# Patient Record
Sex: Male | Born: 1935
Health system: Southern US, Community
[De-identification: ages and names within clinical notes are randomized; demographics above are authoritative.]

## PROBLEM LIST (undated history)

## (undated) DIAGNOSIS — Z9889 Other specified postprocedural states: Secondary | ICD-10-CM

## (undated) DIAGNOSIS — C801 Malignant (primary) neoplasm, unspecified: Secondary | ICD-10-CM

## (undated) DIAGNOSIS — R112 Nausea with vomiting, unspecified: Secondary | ICD-10-CM

## (undated) DIAGNOSIS — I509 Heart failure, unspecified: Secondary | ICD-10-CM

## (undated) DIAGNOSIS — I219 Acute myocardial infarction, unspecified: Secondary | ICD-10-CM

## (undated) DIAGNOSIS — D509 Iron deficiency anemia, unspecified: Secondary | ICD-10-CM

## (undated) DIAGNOSIS — I480 Paroxysmal atrial fibrillation: Secondary | ICD-10-CM

## (undated) DIAGNOSIS — K219 Gastro-esophageal reflux disease without esophagitis: Secondary | ICD-10-CM

## (undated) DIAGNOSIS — K635 Polyp of colon: Secondary | ICD-10-CM

## (undated) DIAGNOSIS — M109 Gout, unspecified: Secondary | ICD-10-CM

## (undated) DIAGNOSIS — Z974 Presence of external hearing-aid: Secondary | ICD-10-CM

## (undated) DIAGNOSIS — N200 Calculus of kidney: Secondary | ICD-10-CM

## (undated) DIAGNOSIS — I2510 Atherosclerotic heart disease of native coronary artery without angina pectoris: Secondary | ICD-10-CM

## (undated) DIAGNOSIS — E785 Hyperlipidemia, unspecified: Secondary | ICD-10-CM

## (undated) DIAGNOSIS — I10 Essential (primary) hypertension: Secondary | ICD-10-CM

## (undated) DIAGNOSIS — D649 Anemia, unspecified: Secondary | ICD-10-CM

## (undated) DIAGNOSIS — D696 Thrombocytopenia, unspecified: Secondary | ICD-10-CM

## (undated) HISTORY — PX: BLADDER SURGERY: SHX569

## (undated) HISTORY — DX: Atherosclerotic heart disease of native coronary artery without angina pectoris: I25.10

## (undated) HISTORY — PX: TONSILLECTOMY: SUR1361

## (undated) HISTORY — DX: Gastro-esophageal reflux disease without esophagitis: K21.9

## (undated) HISTORY — DX: Hyperlipidemia, unspecified: E78.5

## (undated) HISTORY — PX: LASER ABLATION: SHX1947

## (undated) HISTORY — DX: Acute myocardial infarction, unspecified: I21.9

## (undated) HISTORY — DX: Iron deficiency anemia, unspecified: D50.9

## (undated) HISTORY — DX: Polyp of colon: K63.5

## (undated) HISTORY — DX: Gout, unspecified: M10.9

## (undated) HISTORY — DX: Thrombocytopenia, unspecified: D69.6

## (undated) HISTORY — PX: CARDIAC CATHETERIZATION: SHX172

## (undated) HISTORY — PX: INGUINAL HERNIA REPAIR: SUR1180

## (undated) HISTORY — DX: Calculus of kidney: N20.0

## (undated) HISTORY — DX: Anemia, unspecified: D64.9

---

## 2002-11-27 DIAGNOSIS — I219 Acute myocardial infarction, unspecified: Secondary | ICD-10-CM

## 2002-11-27 HISTORY — DX: Acute myocardial infarction, unspecified: I21.9

## 2002-11-27 HISTORY — PX: CORONARY ARTERY BYPASS GRAFT: SHX141

## 2003-08-30 ENCOUNTER — Inpatient Hospital Stay (HOSPITAL_COMMUNITY)
Admission: EM | Admit: 2003-08-30 | Discharge: 2003-09-07 | Payer: Self-pay | Admitting: Thoracic Surgery (Cardiothoracic Vascular Surgery)

## 2003-08-31 ENCOUNTER — Encounter: Payer: Self-pay | Admitting: Thoracic Surgery (Cardiothoracic Vascular Surgery)

## 2003-09-01 ENCOUNTER — Encounter: Payer: Self-pay | Admitting: Thoracic Surgery (Cardiothoracic Vascular Surgery)

## 2003-09-02 ENCOUNTER — Encounter: Payer: Self-pay | Admitting: Thoracic Surgery (Cardiothoracic Vascular Surgery)

## 2003-09-06 ENCOUNTER — Encounter: Payer: Self-pay | Admitting: Cardiothoracic Surgery

## 2003-10-01 ENCOUNTER — Encounter
Admission: RE | Admit: 2003-10-01 | Discharge: 2003-10-01 | Payer: Self-pay | Admitting: Thoracic Surgery (Cardiothoracic Vascular Surgery)

## 2007-06-14 ENCOUNTER — Ambulatory Visit: Payer: Self-pay | Admitting: Unknown Physician Specialty

## 2009-04-12 ENCOUNTER — Ambulatory Visit: Payer: Self-pay | Admitting: Urology

## 2010-03-27 ENCOUNTER — Ambulatory Visit: Payer: Self-pay | Admitting: Internal Medicine

## 2010-04-19 ENCOUNTER — Ambulatory Visit: Payer: Self-pay | Admitting: Internal Medicine

## 2010-04-27 ENCOUNTER — Ambulatory Visit: Payer: Self-pay | Admitting: Internal Medicine

## 2010-05-05 ENCOUNTER — Ambulatory Visit: Payer: Self-pay | Admitting: Unknown Physician Specialty

## 2010-05-27 ENCOUNTER — Ambulatory Visit: Payer: Self-pay | Admitting: Internal Medicine

## 2010-07-11 ENCOUNTER — Ambulatory Visit: Payer: Self-pay | Admitting: Internal Medicine

## 2010-07-28 ENCOUNTER — Ambulatory Visit: Payer: Self-pay | Admitting: Internal Medicine

## 2010-09-12 ENCOUNTER — Ambulatory Visit: Payer: Self-pay | Admitting: Internal Medicine

## 2010-09-27 ENCOUNTER — Ambulatory Visit: Payer: Self-pay | Admitting: Internal Medicine

## 2010-11-14 ENCOUNTER — Ambulatory Visit: Payer: Self-pay | Admitting: Internal Medicine

## 2010-11-27 ENCOUNTER — Ambulatory Visit: Payer: Self-pay | Admitting: Internal Medicine

## 2010-12-28 ENCOUNTER — Ambulatory Visit: Payer: Self-pay | Admitting: Internal Medicine

## 2011-12-25 ENCOUNTER — Ambulatory Visit: Payer: Self-pay | Admitting: Internal Medicine

## 2011-12-25 LAB — CBC CANCER CENTER
Basophil #: 0 x10 3/mm (ref 0.0–0.1)
Basophil %: 0.4 %
Eosinophil #: 0.4 x10 3/mm (ref 0.0–0.7)
Eosinophil %: 3.3 %
HCT: 50.5 % (ref 40.0–52.0)
HGB: 17.2 g/dL (ref 13.0–18.0)
Lymphocyte #: 1.5 x10 3/mm (ref 1.0–3.6)
Lymphocyte %: 12.6 %
MCH: 31.2 pg (ref 26.0–34.0)
MCHC: 33.9 g/dL (ref 32.0–36.0)
MCV: 92 fL (ref 80–100)
Monocyte #: 0.8 x10 3/mm — ABNORMAL HIGH (ref 0.0–0.7)
Monocyte %: 6.9 %
Neutrophil #: 9.5 x10 3/mm — ABNORMAL HIGH (ref 1.4–6.5)
Neutrophil %: 76.8 %
Platelet: 90 x10 3/mm — ABNORMAL LOW (ref 150–440)
RBC: 5.51 10*6/uL (ref 4.40–5.90)
RDW: 13.6 % (ref 11.5–14.5)
WBC: 12.3 x10 3/mm — ABNORMAL HIGH (ref 3.8–10.6)

## 2011-12-25 LAB — IRON AND TIBC
Iron Bind.Cap.(Total): 307 ug/dL (ref 250–450)
Iron Saturation: 28 %
Iron: 85 ug/dL (ref 65–175)
Unbound Iron-Bind.Cap.: 222 ug/dL

## 2011-12-25 LAB — FERRITIN: Ferritin (ARMC): 98 ng/mL (ref 8–388)

## 2011-12-29 ENCOUNTER — Ambulatory Visit: Payer: Self-pay | Admitting: Internal Medicine

## 2012-12-17 ENCOUNTER — Ambulatory Visit: Payer: Self-pay | Admitting: Urology

## 2012-12-17 LAB — CBC WITH DIFFERENTIAL/PLATELET
Basophil %: 0.5 %
HCT: 41 % (ref 40.0–52.0)
HGB: 13.9 g/dL (ref 13.0–18.0)
Lymphocyte #: 1.2 10*3/uL (ref 1.0–3.6)
Lymphocyte %: 9.3 %
MCH: 30.6 pg (ref 26.0–34.0)
Monocyte #: 1.2 x10 3/mm — ABNORMAL HIGH (ref 0.2–1.0)
Monocyte %: 9.2 %
Neutrophil #: 10.3 10*3/uL — ABNORMAL HIGH (ref 1.4–6.5)
Neutrophil %: 78.8 %
RBC: 4.54 10*6/uL (ref 4.40–5.90)
RDW: 13.3 % (ref 11.5–14.5)

## 2012-12-17 LAB — BASIC METABOLIC PANEL
Anion Gap: 8 (ref 7–16)
BUN: 19 mg/dL — ABNORMAL HIGH (ref 7–18)
Calcium, Total: 8.4 mg/dL — ABNORMAL LOW (ref 8.5–10.1)
Chloride: 106 mmol/L (ref 98–107)
Co2: 26 mmol/L (ref 21–32)
Creatinine: 1.05 mg/dL (ref 0.60–1.30)
EGFR (African American): >60
EGFR (Non-African Amer.): >60
Glucose: 77 mg/dL (ref 65–99)
Osmolality: 280 (ref 275–301)
Potassium: 4.4 mmol/L (ref 3.5–5.1)
Sodium: 140 mmol/L (ref 136–145)

## 2012-12-23 ENCOUNTER — Ambulatory Visit: Payer: Self-pay | Admitting: Urology

## 2012-12-23 LAB — PLATELET COUNT: Platelet: 90 10*3/uL — ABNORMAL LOW (ref 150–440)

## 2012-12-28 ENCOUNTER — Ambulatory Visit: Payer: Self-pay | Admitting: Internal Medicine

## 2012-12-30 LAB — IRON AND TIBC
Iron: 87 ug/dL (ref 65–175)
Unbound Iron-Bind.Cap.: 200 ug/dL

## 2012-12-30 LAB — CBC CANCER CENTER
Basophil #: 0.1 x10 3/mm (ref 0.0–0.1)
Basophil %: 0.7 %
Eosinophil #: 0.4 x10 3/mm (ref 0.0–0.7)
HCT: 45.1 % (ref 40.0–52.0)
Lymphocyte #: 1.2 x10 3/mm (ref 1.0–3.6)
MCH: 30.7 pg (ref 26.0–34.0)
MCHC: 33.6 g/dL (ref 32.0–36.0)
MCV: 91 fL (ref 80–100)
Neutrophil %: 80.5 %
Platelet: 85 x10 3/mm — ABNORMAL LOW (ref 150–440)
RBC: 4.95 10*6/uL (ref 4.40–5.90)
RDW: 13.5 % (ref 11.5–14.5)

## 2012-12-30 LAB — FERRITIN: Ferritin (ARMC): 111 ng/mL (ref 8–388)

## 2013-01-25 ENCOUNTER — Ambulatory Visit: Payer: Self-pay | Admitting: Internal Medicine

## 2013-12-30 ENCOUNTER — Ambulatory Visit: Payer: Self-pay | Admitting: Internal Medicine

## 2013-12-30 LAB — CBC CANCER CENTER
BASOS ABS: 0.1 x10 3/mm (ref 0.0–0.1)
Basophil %: 0.8 %
Eosinophil #: 0.3 x10 3/mm (ref 0.0–0.7)
Eosinophil %: 3 %
HCT: 48.1 % (ref 40.0–52.0)
HGB: 15.8 g/dL (ref 13.0–18.0)
LYMPHS PCT: 11.2 %
Lymphocyte #: 1.2 x10 3/mm (ref 1.0–3.6)
MCH: 29.7 pg (ref 26.0–34.0)
MCHC: 32.9 g/dL (ref 32.0–36.0)
MCV: 90 fL (ref 80–100)
Monocyte #: 0.7 x10 3/mm (ref 0.2–1.0)
Monocyte %: 6.2 %
Neutrophil #: 8.6 x10 3/mm — ABNORMAL HIGH (ref 1.4–6.5)
Neutrophil %: 78.8 %
PLATELETS: 72 x10 3/mm — AB (ref 150–440)
RBC: 5.33 10*6/uL (ref 4.40–5.90)
RDW: 13.3 % (ref 11.5–14.5)
WBC: 10.9 x10 3/mm — ABNORMAL HIGH (ref 3.8–10.6)

## 2013-12-30 LAB — IRON AND TIBC
IRON BIND. CAP.(TOTAL): 311 ug/dL (ref 250–450)
IRON SATURATION: 31 %
Iron: 97 ug/dL (ref 65–175)
UNBOUND IRON-BIND. CAP.: 214 ug/dL

## 2013-12-30 LAB — FERRITIN: Ferritin (ARMC): 82 ng/mL (ref 8–388)

## 2014-01-25 ENCOUNTER — Ambulatory Visit: Payer: Self-pay | Admitting: Internal Medicine

## 2014-02-12 ENCOUNTER — Ambulatory Visit: Payer: Self-pay | Admitting: Unknown Physician Specialty

## 2014-02-12 LAB — PLATELET COUNT: Platelet: 70 10*3/uL — ABNORMAL LOW (ref 150–440)

## 2014-02-16 LAB — PATHOLOGY REPORT

## 2014-06-29 ENCOUNTER — Ambulatory Visit: Payer: Self-pay | Admitting: Internal Medicine

## 2014-06-29 LAB — IRON AND TIBC
Iron Bind.Cap.(Total): 290 ug/dL (ref 250–450)
Iron Saturation: 27 %
Iron: 78 ug/dL (ref 65–175)
UNBOUND IRON-BIND. CAP.: 212 ug/dL

## 2014-06-29 LAB — CBC CANCER CENTER
Basophil #: 0.1 x10 3/mm (ref 0.0–0.1)
Basophil %: 0.7 %
Eosinophil #: 0.4 x10 3/mm (ref 0.0–0.7)
Eosinophil %: 3.7 %
HCT: 47.2 % (ref 40.0–52.0)
HGB: 15.6 g/dL (ref 13.0–18.0)
LYMPHS ABS: 1.2 x10 3/mm (ref 1.0–3.6)
Lymphocyte %: 11.4 %
MCH: 30 pg (ref 26.0–34.0)
MCHC: 33.1 g/dL (ref 32.0–36.0)
MCV: 91 fL (ref 80–100)
MONO ABS: 0.7 x10 3/mm (ref 0.2–1.0)
Monocyte %: 6.5 %
Neutrophil #: 8.2 x10 3/mm — ABNORMAL HIGH (ref 1.4–6.5)
Neutrophil %: 77.7 %
Platelet: 74 x10 3/mm — ABNORMAL LOW (ref 150–440)
RBC: 5.21 10*6/uL (ref 4.40–5.90)
RDW: 13.9 % (ref 11.5–14.5)
WBC: 10.6 x10 3/mm (ref 3.8–10.6)

## 2014-06-29 LAB — FERRITIN: FERRITIN (ARMC): 80 ng/mL (ref 8–388)

## 2014-07-28 ENCOUNTER — Ambulatory Visit: Payer: Self-pay | Admitting: Internal Medicine

## 2014-09-29 DIAGNOSIS — I2510 Atherosclerotic heart disease of native coronary artery without angina pectoris: Secondary | ICD-10-CM | POA: Insufficient documentation

## 2014-12-30 ENCOUNTER — Ambulatory Visit: Payer: Self-pay | Admitting: Internal Medicine

## 2014-12-30 LAB — IRON AND TIBC
IRON BIND. CAP.(TOTAL): 275 ug/dL (ref 250–450)
IRON SATURATION: 35 %
IRON: 95 ug/dL (ref 65–175)
UNBOUND IRON-BIND. CAP.: 180 ug/dL

## 2014-12-30 LAB — CBC CANCER CENTER
Basophil #: 0.1 "x10 3/mm "
Basophil %: 0.6 %
Eosinophil #: 0.4 "x10 3/mm "
Eosinophil %: 3.5 %
HCT: 46.7 %
HGB: 15.3 g/dL
Lymphocyte %: 10.8 %
Lymphs Abs: 1.2 "x10 3/mm "
MCH: 29.5 pg
MCHC: 32.7 g/dL
MCV: 90 fL
Monocyte #: 1 "x10 3/mm "
Monocyte %: 8.4 %
Neutrophil #: 8.7 "x10 3/mm " — ABNORMAL HIGH
Neutrophil %: 76.7 %
Platelet: 69 "x10 3/mm " — ABNORMAL LOW
RBC: 5.19 "x10 6/mm "
RDW: 13.7 %
WBC: 11.3 "x10 3/mm " — ABNORMAL HIGH

## 2014-12-30 LAB — FERRITIN: Ferritin (ARMC): 80 ng/mL

## 2015-01-26 ENCOUNTER — Ambulatory Visit
Admit: 2015-01-26 | Disposition: A | Discharge status: Home / Self Care | Payer: Self-pay | Attending: Internal Medicine | Admitting: Internal Medicine

## 2015-03-19 NOTE — H&P (Signed)
PATIENT NAME:  Levi Figueroa, Levi Figueroa MR#:  [id number] DATE OF BIRTH:  February 24, 1935  DATE OF ADMISSION:  12/23/2012  CHIEF COMPLAINT: Difficulty voiding.   HISTORY OF PRESENT ILLNESS: Mr. Denne is a 80 year old white male with a greater than one-year history of progressively worsening urinary symptoms. At the present time, he complains of incomplete emptying, frequency, intermittency, weak stream, straining and nocturia, and a UA symptom score of 18 with a quality-of-life score of 4. He also has slight degree of dysuria. He has history of BPH and is status post laser vaporization back in 2004. He underwent Indigo laser treatment in 1999 and removal of a bladder stone in 2004. Evaluation in the office included a PSA of 2.0, uroflow with maximum flow rate of 6 mL/sec and a cystoscopy which indicated bladder neck contracture of approximately 23 French with lateral lobe prostatic hypertrophy. He comes in now for photovaporization of the prostate and bladder neck with the green light laser.   ALLERGIES: PENICILLIN.   CURRENT MEDICATIONS:  1. Omeprazole 20 mg a day. 2. Loratadine 10 mg a day. 3. Diltiazem ER 240 mg a day. 4. Metoprolol 25 mg 2 times a day. 5. Atorvastatin 50 mg a day. 6. Multivitamin one a day.  7. Fish oil 1000 units a day. 8. Aspirin 81 mg a day. 9. Extra-strength Tylenol p.r.n.  10. Aleve p.r.n.  11. Ibuprofen p.r.n.   PAST SURGICAL HISTORY:  1. Coronary artery bypass graft x 5. 2. Right inguinal herniorrhaphy x 2. 3. Laser ablation of the prostate x 2. 4. Colonoscopy with polyp removal.  5. Tonsillectomy.   SOCIAL HISTORY: The patient denied tobacco or alcohol abuse.   FAMILY HISTORY: The patient's father died of coronary artery disease. Father also had diabetes. Mother had breast cancer.   PAST AND CURRENT MEDICAL CONDITIONS:  1. Coronary artery disease.  2. Hyperlipidemia.  3. Gastroesophageal reflux disease. 4. Thrombocytopenia.  5. Hypertension.   REVIEW OF SYSTEMS:  The patient denies chest pain, shortness of breath, diabetes or stroke.   PHYSICAL EXAMINATION:   GENERAL: An elderly white male in no distress.   HEENT: Sclerae were clear. Pupils were equally round and reactive to light and accommodation.   NECK: Supple. No palpable cervical adenopathy.   PULMONARY: Lungs clear to auscultation.   CARDIOVASCULAR: Regular rhythm and rate without audible murmurs.   ABDOMEN: Soft and nontender abdomen.   GENITOURINARY: Uncircumcised. Testes smooth and nontender. Left testis atrophic, right testis normal.   RECTAL: Greater than 50 gram smooth, nontender prostate.   NEUROPSYCHIATRIC: Alert and oriented x 3, nonfocal.     IMPRESSION: Bladder outlet obstruction.   PLAN: Photovaporization of the prostate and bladder neck with green light laser. ____________________________ Otelia Limes. Yves Dill, MD [email or web address] D: 12/18/2012 10:54:24 ET T: 12/18/2012 11:11:29 ET JOB#: [id number]  cc: Otelia Limes. Yves Dill, MD, <Dictator> Royston Cowper MD ELECTRONICALLY SIGNED 12/23/2012 7:17

## 2015-03-19 NOTE — Op Note (Signed)
PATIENT NAME:  Levi Figueroa, Levi Figueroa MR#:  [id number] DATE OF BIRTH:  January 22, 1935  DATE OF PROCEDURE:  12/23/2012  PREOPERATIVE DIAGNOSES: 1. Bladder outlet obstruction.  2. Hematuria. 3. Benign prostatic hypertrophy.   POSTOPERATIVE DIAGNOSES: 1. Bladder outlet obstruction.  2. Hematuria. 3. Benign prostatic hypertrophy.   PROCEDURE PERFORMED: Photovaporization of the prostate with green light laser.   SURGEON: Maryan Puls, MD  ANESTHETIST: Julianne Handler, MD / Maryan Puls, MD  ANESTHESIA: General per Dr. [place] Service and local per Dr. Yves Dill.   INDICATIONS: See the dictated history and physical. After informed consent, the patient requests the above procedure.   OPERATIVE SUMMARY: After adequate general anesthesia had been obtained, the patient was placed into the dorsal lithotomy position and the perineum was prepped and draped in the usual fashion. The laser scope was coupled with the camera and then visually advanced into the bladder. The patient had trilobar BPH with visual obstruction. Bladder was moderately trabeculated. No bladder mucosal lesions were identified. Both ureteral orifices were identified and had clear efflux. At this point, the green light XPS laser fiber was introduced through the scope and initial power setting placed at 80 watts. Bladder neck tissue and median lobe was vaporized. Power was then increased to 120 watts and remaining obstructive tissue was vaporized back to the level of the verumontanum. At this point, the scope was removed and 10 mL of viscous Xylocaine was instilled within the urethra and the bladder. A 20 French silicone     catheter was placed. The catheter was irrigated until clear. A B and O suppository was placed. The procedure was then terminated and the patient was transferred to the recovery room in stable condition.  ____________________________ Otelia Limes. Yves Dill, MD [email or web address] D: 12/23/2012 08:54:24 ET T: 12/23/2012 09:03:14  ET JOB#: [id number]  cc: Otelia Limes. Yves Dill, MD, <Dictator> Royston Cowper MD ELECTRONICALLY SIGNED 12/23/2012 9:48

## 2015-06-30 ENCOUNTER — Inpatient Hospital Stay: Payer: PPO | Attending: Internal Medicine

## 2015-06-30 ENCOUNTER — Other Ambulatory Visit: Payer: Self-pay | Admitting: *Deleted

## 2015-06-30 DIAGNOSIS — D649 Anemia, unspecified: Secondary | ICD-10-CM

## 2015-06-30 DIAGNOSIS — D696 Thrombocytopenia, unspecified: Secondary | ICD-10-CM | POA: Insufficient documentation

## 2015-06-30 DIAGNOSIS — D72829 Elevated white blood cell count, unspecified: Secondary | ICD-10-CM | POA: Diagnosis not present

## 2015-06-30 LAB — VITAMIN B12: VITAMIN B 12: 598 pg/mL (ref 180–914)

## 2015-06-30 LAB — CBC WITH DIFFERENTIAL/PLATELET
Basophils Absolute: 0.1 10*3/uL (ref 0–0.1)
Basophils Relative: 1 %
EOS PCT: 3 %
Eosinophils Absolute: 0.3 10*3/uL (ref 0–0.7)
HCT: 47.1 % (ref 40.0–52.0)
HEMOGLOBIN: 15.6 g/dL (ref 13.0–18.0)
LYMPHS PCT: 10 %
Lymphs Abs: 1.3 10*3/uL (ref 1.0–3.6)
MCH: 29.5 pg (ref 26.0–34.0)
MCHC: 33.2 g/dL (ref 32.0–36.0)
MCV: 89.1 fL (ref 80.0–100.0)
Monocytes Absolute: 1 10*3/uL (ref 0.2–1.0)
Monocytes Relative: 8 %
NEUTROS ABS: 9.9 10*3/uL — AB (ref 1.4–6.5)
NEUTROS PCT: 78 %
Platelets: 78 10*3/uL — ABNORMAL LOW (ref 150–440)
RBC: 5.28 MIL/uL (ref 4.40–5.90)
RDW: 14.3 % (ref 11.5–14.5)
WBC: 12.5 10*3/uL — ABNORMAL HIGH (ref 3.8–10.6)

## 2015-06-30 LAB — FOLATE: Folate: 40 ng/mL (ref >5.9)

## 2015-06-30 LAB — IRON AND TIBC
Iron: 93 ug/dL (ref 45–182)
SATURATION RATIOS: 31 % (ref 17.9–39.5)
TIBC: 298 ug/dL (ref 250–450)
UIBC: 205 ug/dL

## 2015-06-30 LAB — PROTIME-INR
INR: 1.05
Prothrombin Time: 13.9 seconds (ref 11.4–15.0)

## 2015-06-30 LAB — APTT: aPTT: 30 seconds (ref 24–36)

## 2015-06-30 LAB — FERRITIN: Ferritin: 82 ng/mL (ref 24–336)

## 2015-12-29 ENCOUNTER — Other Ambulatory Visit: Payer: Self-pay | Admitting: *Deleted

## 2015-12-29 ENCOUNTER — Encounter: Payer: Self-pay | Admitting: *Deleted

## 2015-12-29 DIAGNOSIS — D696 Thrombocytopenia, unspecified: Secondary | ICD-10-CM

## 2015-12-29 DIAGNOSIS — D509 Iron deficiency anemia, unspecified: Secondary | ICD-10-CM

## 2015-12-31 ENCOUNTER — Encounter: Payer: Self-pay | Admitting: Internal Medicine

## 2015-12-31 ENCOUNTER — Inpatient Hospital Stay: Payer: PPO | Attending: Internal Medicine

## 2015-12-31 ENCOUNTER — Inpatient Hospital Stay (HOSPITAL_BASED_OUTPATIENT_CLINIC_OR_DEPARTMENT_OTHER): Payer: PPO | Admitting: Internal Medicine

## 2015-12-31 VITALS — BP 132/72 | HR 76 | Temp 97.6°F | Resp 18 | Ht 71.0 in | Wt 176.8 lb

## 2015-12-31 DIAGNOSIS — K219 Gastro-esophageal reflux disease without esophagitis: Secondary | ICD-10-CM

## 2015-12-31 DIAGNOSIS — Z7982 Long term (current) use of aspirin: Secondary | ICD-10-CM | POA: Insufficient documentation

## 2015-12-31 DIAGNOSIS — Z87442 Personal history of urinary calculi: Secondary | ICD-10-CM | POA: Diagnosis not present

## 2015-12-31 DIAGNOSIS — Z79899 Other long term (current) drug therapy: Secondary | ICD-10-CM | POA: Insufficient documentation

## 2015-12-31 DIAGNOSIS — Z951 Presence of aortocoronary bypass graft: Secondary | ICD-10-CM

## 2015-12-31 DIAGNOSIS — I252 Old myocardial infarction: Secondary | ICD-10-CM

## 2015-12-31 DIAGNOSIS — I2510 Atherosclerotic heart disease of native coronary artery without angina pectoris: Secondary | ICD-10-CM

## 2015-12-31 DIAGNOSIS — M109 Gout, unspecified: Secondary | ICD-10-CM | POA: Diagnosis not present

## 2015-12-31 DIAGNOSIS — Z86010 Personal history of colonic polyps: Secondary | ICD-10-CM

## 2015-12-31 DIAGNOSIS — E785 Hyperlipidemia, unspecified: Secondary | ICD-10-CM | POA: Diagnosis not present

## 2015-12-31 DIAGNOSIS — D693 Immune thrombocytopenic purpura: Secondary | ICD-10-CM

## 2015-12-31 DIAGNOSIS — D509 Iron deficiency anemia, unspecified: Secondary | ICD-10-CM | POA: Diagnosis not present

## 2015-12-31 DIAGNOSIS — D696 Thrombocytopenia, unspecified: Secondary | ICD-10-CM | POA: Insufficient documentation

## 2015-12-31 LAB — CBC WITH DIFFERENTIAL/PLATELET
Basophils Absolute: 0.1 10*3/uL (ref 0–0.1)
Basophils Relative: 1 %
Eosinophils Absolute: 0.3 10*3/uL (ref 0–0.7)
Eosinophils Relative: 2 %
HCT: 45 % (ref 40.0–52.0)
Hemoglobin: 15.3 g/dL (ref 13.0–18.0)
Lymphocytes Relative: 7 %
Lymphs Abs: 0.9 10*3/uL — ABNORMAL LOW (ref 1.0–3.6)
MCH: 30 pg (ref 26.0–34.0)
MCHC: 34.1 g/dL (ref 32.0–36.0)
MCV: 87.9 fL (ref 80.0–100.0)
Monocytes Absolute: 0.9 10*3/uL (ref 0.2–1.0)
Monocytes Relative: 7 %
Neutro Abs: 10.6 10*3/uL — ABNORMAL HIGH (ref 1.4–6.5)
Neutrophils Relative %: 83 %
Platelets: 67 10*3/uL — ABNORMAL LOW (ref 150–440)
RBC: 5.11 MIL/uL (ref 4.40–5.90)
RDW: 13.7 % (ref 11.5–14.5)
WBC: 12.8 10*3/uL — ABNORMAL HIGH (ref 3.8–10.6)

## 2015-12-31 LAB — IRON AND TIBC
Iron: 78 ug/dL (ref 45–182)
SATURATION RATIOS: 27 % (ref 17.9–39.5)
TIBC: 292 ug/dL (ref 250–450)
UIBC: 214 ug/dL

## 2015-12-31 LAB — FERRITIN: Ferritin: 91 ng/mL (ref 24–336)

## 2015-12-31 NOTE — Progress Notes (Signed)
[facility] OFFICE PROGRESS NOTE  Patient Care Team: [name], MD as PCP - General (Internal Medicine)   SUMMARY OF ONCOLOGIC HISTORY:  2012- CHRONIC THROMBOCYTOPENIA [70-90s] likely ITP [ BMBx; 2012- hypercellular 60%; megakaryocytes/no dyspoiesis; FISH- Neg; Dr.Pandit]; [place]- Abdo-Neg for spleen/liver; HIV/hepatitis-NEG  INTERVAL HISTORY:  This is my first interaction with the patient since I joined the practice September 2016. I reviewed the patient's prior charts/pertinent labs/imaging in detail; findings are summarized above.   A very pleasant 81 year old Caucasian male patient with above history of chronic thrombocytopenia currently on surveillance is here for follow-up.  Patient denies any blood in stools black stools. No nausea no vomiting. No weight loss. No skin rash. No night sweats.    REVIEW OF SYSTEMS:  A complete 10 point review of system is done which is negative except mentioned above/history of present illness.   PAST MEDICAL HISTORY :  Past Medical History  Diagnosis Date  . Thrombocytopenia ([place])   . Mild anemia   . IDA (iron deficiency anemia)   . Hyperlipidemia   . GERD (gastroesophageal reflux disease)   . Colon polyps   . CAD (coronary artery disease)   . MI (myocardial infarction) ([place]) 2004  . Kidney stones   . Gout     PAST SURGICAL HISTORY :   Past Surgical History  Procedure Laterality Date  . Inguinal hernia repair Right   . Coronary artery bypass graft    . Laser ablation      x 2 prostatic hypertrophy  . Bladder surgery    . Tonsillectomy      FAMILY HISTORY :   Family History  Problem Relation Age of Onset  . Breast cancer Mother   . Diabetes Father   . Heart disease Father   . Heart disease Brother   . Heart disease Brother     SOCIAL HISTORY:   Social History  Substance Use Topics  . Smoking status: Never Smoker   . Smokeless tobacco: Never Used  . Alcohol Use: No    ALLERGIES:  is allergic to  penicillins.  MEDICATIONS:  Current Outpatient Prescriptions  Medication Sig Dispense Refill  . acetaminophen (TYLENOL) 500 MG tablet Take 500 mg by mouth every 6 (six) hours as needed.    Marland Kitchen aspirin 81 MG tablet Take 81 mg by mouth daily.    Marland Kitchen atorvastatin (LIPITOR) 10 MG tablet Take 10 mg by mouth daily. 1/2 tablet    . colchicine 0.6 MG tablet Take 0.6 mg by mouth daily as needed.    . Cyanocobalamin (VITAMIN B 12 PO) Take 1 Dose by mouth daily.    Marland Kitchen diltiazem (DILACOR XR) 240 MG 24 hr capsule Take 240 mg by mouth.    . loratadine (CLARITIN) 10 MG tablet Take 10 mg by mouth daily as needed for allergies.    . metoprolol succinate (TOPROL-XL) 25 MG 24 hr tablet Take 25 mg by mouth 2 (two) times daily.    . Multiple Vitamins-Minerals (MULTIVITAMIN WITH MINERALS) tablet Take 1 tablet by mouth daily.    . multivitamin-lutein (OCUVITE-LUTEIN) CAPS capsule Take 2 capsules by mouth daily.    . Omega-3 Fatty Acids (FISH OIL) 1000 MG CAPS Take 1 capsule by mouth daily.    Marland Kitchen omeprazole (PRILOSEC) 20 MG capsule Take 20 mg by mouth daily.     No current facility-administered medications for this visit.    PHYSICAL EXAMINATION:   BP 132/72 mmHg  Pulse 76  Temp(Src) 97.6 F (  36.4 C) (Tympanic)  Ht 5\' 11"  (1.803 m)  Wt 176 lb 12.9 oz (80.2 kg)  BMI 24.67 kg/m2  Filed Weights   12/31/15 1159  Weight: 176 lb 12.9 oz (80.2 kg)    GENERAL: Well-nourished well-developed; Alert, no distress and comfortable.  Accompanied by his wife. EYES: no pallor or icterus OROPHARYNX: no thrush or ulceration;  NECK: supple, no masses felt LYMPH:  no palpable lymphadenopathy in the cervical, axillary or inguinal regions LUNGS: clear to auscultation and  No wheeze or crackles HEART/CVS: regular rate & rhythm and no murmurs; No lower extremity edema ABDOMEN:abdomen soft, non-tender and normal bowel sounds Musculoskeletal:no cyanosis of digits and no clubbing  PSYCH: alert & oriented x 3 with fluent  speech NEURO: no focal motor/sensory deficits SKIN:  no rashes or significant lesions  LABORATORY DATA:  I have reviewed the data as listed    Component Value Date/Time   NA 140 12/17/2012 1229   K 4.4 12/17/2012 1229   CL 106 12/17/2012 1229   CO2 26 12/17/2012 1229   GLUCOSE 77 12/17/2012 1229   BUN 19* 12/17/2012 1229   CREATININE 1.05 12/17/2012 1229   CALCIUM 8.4* 12/17/2012 1229   GFRNONAA >60 12/17/2012 1229   GFRAA >60 12/17/2012 1229    No results found for: SPEP, UPEP  Lab Results  Component Value Date   WBC 12.8* 12/31/2015   NEUTROABS 10.6* 12/31/2015   HGB 15.3 12/31/2015   HCT 45.0 12/31/2015   MCV 87.9 12/31/2015   PLT 67* 12/31/2015      Chemistry      Component Value Date/Time   NA 140 12/17/2012 1229   K 4.4 12/17/2012 1229   CL 106 12/17/2012 1229   CO2 26 12/17/2012 1229   BUN 19* 12/17/2012 1229   CREATININE 1.05 12/17/2012 1229      Component Value Date/Time   CALCIUM 8.4* 12/17/2012 1229       RADIOGRAPHIC STUDIES: I have personally reviewed the radiological images as listed and agreed with the findings in the report. No results found.   ASSESSMENT & PLAN:   # Chronic thrombocytopenia- likely ITP. Currently on surveillance. Today platelets are 67 otherwise hemoglobin is normal white count is slightly elevated 12. Patient is asymptomatic.   # for now I recommend continued surveillance; however spoke to the patient that I would recommen treatment if his platelets are less than 50/as the patient is on aspirin. Patient's platelet count today is 67. Also discussed that I might start him on low-dose of steroids if his platelets are less than 50.  # patient will have his labs done through Dr. Jens Som in 3 months [he will have the labs faxed over to us]; otherwise he'll see me back in 6 months with labs.     Cammie Sickle, MD 12/31/2015 12:05 PM

## 2015-12-31 NOTE — Progress Notes (Signed)
No bleeding from gums, nose or in stool or urine. No extra bruising noted. No concerns.

## 2016-01-28 DIAGNOSIS — R351 Nocturia: Secondary | ICD-10-CM | POA: Diagnosis not present

## 2016-01-28 DIAGNOSIS — N400 Enlarged prostate without lower urinary tract symptoms: Secondary | ICD-10-CM | POA: Diagnosis not present

## 2016-03-06 DIAGNOSIS — L570 Actinic keratosis: Secondary | ICD-10-CM | POA: Diagnosis not present

## 2016-03-06 DIAGNOSIS — X32XXXA Exposure to sunlight, initial encounter: Secondary | ICD-10-CM | POA: Diagnosis not present

## 2016-03-06 DIAGNOSIS — D485 Neoplasm of uncertain behavior of skin: Secondary | ICD-10-CM | POA: Diagnosis not present

## 2016-03-06 DIAGNOSIS — L821 Other seborrheic keratosis: Secondary | ICD-10-CM | POA: Diagnosis not present

## 2016-03-06 DIAGNOSIS — Z85828 Personal history of other malignant neoplasm of skin: Secondary | ICD-10-CM | POA: Diagnosis not present

## 2016-03-06 DIAGNOSIS — Z08 Encounter for follow-up examination after completed treatment for malignant neoplasm: Secondary | ICD-10-CM | POA: Diagnosis not present

## 2016-03-06 DIAGNOSIS — C4362 Malignant melanoma of left upper limb, including shoulder: Secondary | ICD-10-CM | POA: Diagnosis not present

## 2016-03-06 DIAGNOSIS — D0439 Carcinoma in situ of skin of other parts of face: Secondary | ICD-10-CM | POA: Diagnosis not present

## 2016-03-16 DIAGNOSIS — C4362 Malignant melanoma of left upper limb, including shoulder: Secondary | ICD-10-CM | POA: Diagnosis not present

## 2016-03-16 DIAGNOSIS — D0362 Melanoma in situ of left upper limb, including shoulder: Secondary | ICD-10-CM | POA: Diagnosis not present

## 2016-03-17 DIAGNOSIS — Z85820 Personal history of malignant melanoma of skin: Secondary | ICD-10-CM | POA: Insufficient documentation

## 2016-03-22 DIAGNOSIS — D6489 Other specified anemias: Secondary | ICD-10-CM | POA: Diagnosis not present

## 2016-03-22 DIAGNOSIS — D696 Thrombocytopenia, unspecified: Secondary | ICD-10-CM | POA: Diagnosis not present

## 2016-03-22 DIAGNOSIS — M1000 Idiopathic gout, unspecified site: Secondary | ICD-10-CM | POA: Diagnosis not present

## 2016-03-22 DIAGNOSIS — I10 Essential (primary) hypertension: Secondary | ICD-10-CM | POA: Diagnosis not present

## 2016-03-22 DIAGNOSIS — R809 Proteinuria, unspecified: Secondary | ICD-10-CM | POA: Diagnosis not present

## 2016-03-30 DIAGNOSIS — D0439 Carcinoma in situ of skin of other parts of face: Secondary | ICD-10-CM | POA: Diagnosis not present

## 2016-03-30 DIAGNOSIS — L905 Scar conditions and fibrosis of skin: Secondary | ICD-10-CM | POA: Diagnosis not present

## 2016-04-03 DIAGNOSIS — M1000 Idiopathic gout, unspecified site: Secondary | ICD-10-CM | POA: Diagnosis not present

## 2016-04-03 DIAGNOSIS — Z85820 Personal history of malignant melanoma of skin: Secondary | ICD-10-CM | POA: Diagnosis not present

## 2016-04-03 DIAGNOSIS — N400 Enlarged prostate without lower urinary tract symptoms: Secondary | ICD-10-CM | POA: Diagnosis not present

## 2016-04-03 DIAGNOSIS — I480 Paroxysmal atrial fibrillation: Secondary | ICD-10-CM | POA: Diagnosis not present

## 2016-04-03 DIAGNOSIS — D472 Monoclonal gammopathy: Secondary | ICD-10-CM | POA: Diagnosis not present

## 2016-04-03 DIAGNOSIS — E784 Other hyperlipidemia: Secondary | ICD-10-CM | POA: Diagnosis not present

## 2016-04-03 DIAGNOSIS — D696 Thrombocytopenia, unspecified: Secondary | ICD-10-CM | POA: Diagnosis not present

## 2016-04-03 DIAGNOSIS — K219 Gastro-esophageal reflux disease without esophagitis: Secondary | ICD-10-CM | POA: Diagnosis not present

## 2016-04-03 DIAGNOSIS — D6489 Other specified anemias: Secondary | ICD-10-CM | POA: Diagnosis not present

## 2016-04-03 DIAGNOSIS — I10 Essential (primary) hypertension: Secondary | ICD-10-CM | POA: Diagnosis not present

## 2016-04-03 DIAGNOSIS — I2510 Atherosclerotic heart disease of native coronary artery without angina pectoris: Secondary | ICD-10-CM | POA: Diagnosis not present

## 2016-06-29 ENCOUNTER — Inpatient Hospital Stay: Payer: PPO | Attending: Internal Medicine

## 2016-06-29 ENCOUNTER — Inpatient Hospital Stay (HOSPITAL_BASED_OUTPATIENT_CLINIC_OR_DEPARTMENT_OTHER): Payer: PPO | Admitting: Internal Medicine

## 2016-06-29 DIAGNOSIS — Z86010 Personal history of colonic polyps: Secondary | ICD-10-CM

## 2016-06-29 DIAGNOSIS — D693 Immune thrombocytopenic purpura: Secondary | ICD-10-CM | POA: Insufficient documentation

## 2016-06-29 DIAGNOSIS — Z85820 Personal history of malignant melanoma of skin: Secondary | ICD-10-CM | POA: Insufficient documentation

## 2016-06-29 DIAGNOSIS — Z87442 Personal history of urinary calculi: Secondary | ICD-10-CM | POA: Insufficient documentation

## 2016-06-29 DIAGNOSIS — K219 Gastro-esophageal reflux disease without esophagitis: Secondary | ICD-10-CM

## 2016-06-29 DIAGNOSIS — I252 Old myocardial infarction: Secondary | ICD-10-CM | POA: Insufficient documentation

## 2016-06-29 DIAGNOSIS — Z79899 Other long term (current) drug therapy: Secondary | ICD-10-CM | POA: Insufficient documentation

## 2016-06-29 DIAGNOSIS — M109 Gout, unspecified: Secondary | ICD-10-CM

## 2016-06-29 DIAGNOSIS — Z803 Family history of malignant neoplasm of breast: Secondary | ICD-10-CM

## 2016-06-29 DIAGNOSIS — D696 Thrombocytopenia, unspecified: Secondary | ICD-10-CM | POA: Insufficient documentation

## 2016-06-29 DIAGNOSIS — E785 Hyperlipidemia, unspecified: Secondary | ICD-10-CM | POA: Diagnosis not present

## 2016-06-29 DIAGNOSIS — Z7982 Long term (current) use of aspirin: Secondary | ICD-10-CM | POA: Diagnosis not present

## 2016-06-29 DIAGNOSIS — R319 Hematuria, unspecified: Secondary | ICD-10-CM

## 2016-06-29 DIAGNOSIS — D509 Iron deficiency anemia, unspecified: Secondary | ICD-10-CM | POA: Diagnosis not present

## 2016-06-29 DIAGNOSIS — I2510 Atherosclerotic heart disease of native coronary artery without angina pectoris: Secondary | ICD-10-CM | POA: Insufficient documentation

## 2016-06-29 LAB — CBC WITH DIFFERENTIAL/PLATELET
BASOS ABS: 0.1 10*3/uL (ref 0–0.1)
EOS ABS: 0.3 10*3/uL (ref 0–0.7)
HCT: 43.7 % (ref 40.0–52.0)
Hemoglobin: 15 g/dL (ref 13.0–18.0)
Lymphocytes Relative: 11 %
Lymphs Abs: 1.2 10*3/uL (ref 1.0–3.6)
MCH: 30.1 pg (ref 26.0–34.0)
MCHC: 34.2 g/dL (ref 32.0–36.0)
MCV: 88 fL (ref 80.0–100.0)
MONO ABS: 0.7 10*3/uL (ref 0.2–1.0)
Monocytes Relative: 7 %
Neutro Abs: 8.5 10*3/uL — ABNORMAL HIGH (ref 1.4–6.5)
Neutrophils Relative %: 78 %
PLATELETS: 59 10*3/uL — AB (ref 150–440)
RBC: 4.97 MIL/uL (ref 4.40–5.90)
RDW: 14.1 % (ref 11.5–14.5)
WBC: 10.9 10*3/uL — AB (ref 3.8–10.6)

## 2016-06-29 LAB — COMPREHENSIVE METABOLIC PANEL
ALBUMIN: 4.1 g/dL (ref 3.5–5.0)
ALT: 17 U/L (ref 17–63)
AST: 19 U/L (ref 15–41)
Alkaline Phosphatase: 55 U/L (ref 38–126)
Anion gap: 8 (ref 5–15)
BUN: 17 mg/dL (ref 6–20)
CHLORIDE: 108 mmol/L (ref 101–111)
CO2: 27 mmol/L (ref 22–32)
Calcium: 9 mg/dL (ref 8.9–10.3)
Creatinine, Ser: 0.95 mg/dL (ref 0.61–1.24)
GFR calc Af Amer: >60 mL/min (ref >60)
GLUCOSE: 110 mg/dL — AB (ref 65–99)
POTASSIUM: 4.2 mmol/L (ref 3.5–5.1)
SODIUM: 143 mmol/L (ref 135–145)
Total Bilirubin: 0.6 mg/dL (ref 0.3–1.2)
Total Protein: 7.5 g/dL (ref 6.5–8.1)

## 2016-06-29 MED ORDER — DEXAMETHASONE 4 MG PO TABS: ORAL_TABLET | ORAL | 0 refills | Status: DC

## 2016-06-29 NOTE — Progress Notes (Signed)
[facility] OFFICE PROGRESS NOTE  Patient Care Team: [name], MD as PCP - General (Internal Medicine)   SUMMARY OF ONCOLOGIC HISTORY:  2012- CHRONIC THROMBOCYTOPENIA [70-90s] likely ITP [ BMBx; 2012- hypercellular 60%; megakaryocytes/no dyspoiesis; FISH- Neg; Dr.Pandit]; [place]- Abdo-Neg for spleen/liver; HIV/hepatitis-NEG  # July 2017- Melanoma s/p excision [? Stage; Dr.Dasher]  INTERVAL HISTORY:  A very pleasant 81 year old Caucasian male patient with above history of chronic thrombocytopenia currently on surveillance is here for follow-up.  Patient admits to episodes of intermittent hematuria after "heavy exertion-like doing yard work". He was evaluated by urology Dr. Yves Dill.  He recently had a melanoma taken off as left arm  He otherwise denies any blood in stools black stools. No nausea no vomiting. No weight loss. No skin rash. No night sweats.    REVIEW OF SYSTEMS:  A complete 10 point review of system is done which is negative except mentioned above/history of present illness.   PAST MEDICAL HISTORY :  Past Medical History:  Diagnosis Date  . CAD (coronary artery disease)   . Colon polyps   . GERD (gastroesophageal reflux disease)   . Gout   . Hyperlipidemia   . IDA (iron deficiency anemia)   . Kidney stones   . MI (myocardial infarction) ([place]) 2004  . Mild anemia   . Thrombocytopenia ([place])     PAST SURGICAL HISTORY :   Past Surgical History:  Procedure Laterality Date  . BLADDER SURGERY    . CORONARY ARTERY BYPASS GRAFT    . INGUINAL HERNIA REPAIR Right   . LASER ABLATION     x 2 prostatic hypertrophy  . TONSILLECTOMY      FAMILY HISTORY :   Family History  Problem Relation Age of Onset  . Breast cancer Mother   . Diabetes Father   . Heart disease Father   . Heart disease Brother   . Heart disease Brother     SOCIAL HISTORY:   Social History  Substance Use Topics  . Smoking status: Never Smoker  . Smokeless tobacco: Never Used   . Alcohol use No    ALLERGIES:  is allergic to penicillins.  MEDICATIONS:  Current Outpatient Prescriptions  Medication Sig Dispense Refill  . acetaminophen (TYLENOL) 500 MG tablet Take 500 mg by mouth every 6 (six) hours as needed.    Marland Kitchen aspirin 81 MG tablet Take 81 mg by mouth daily.    Marland Kitchen atorvastatin (LIPITOR) 10 MG tablet Take 10 mg by mouth daily. 1/2 tablet    . colchicine 0.6 MG tablet Take 0.6 mg by mouth daily as needed.    . Cyanocobalamin (VITAMIN B 12 PO) Take 1 Dose by mouth daily.    Marland Kitchen diltiazem (DILACOR XR) 240 MG 24 hr capsule Take 240 mg by mouth.    . loratadine (CLARITIN) 10 MG tablet Take 10 mg by mouth daily as needed for allergies.    . metoprolol succinate (TOPROL-XL) 25 MG 24 hr tablet Take 25 mg by mouth 2 (two) times daily.    . Multiple Vitamins-Minerals (MULTIVITAMIN WITH MINERALS) tablet Take 1 tablet by mouth daily.    . multivitamin-lutein (OCUVITE-LUTEIN) CAPS capsule Take 2 capsules by mouth daily.    . Omega-3 Fatty Acids (FISH OIL) 1000 MG CAPS Take 1 capsule by mouth daily.    Marland Kitchen omeprazole (PRILOSEC) 20 MG capsule Take 20 mg by mouth daily.     No current facility-administered medications for this visit.     PHYSICAL EXAMINATION:  BP 135/70 (BP Location: Left Arm, Patient Position: Sitting)   Pulse 69   Temp 97.9 F (36.6 C) (Tympanic)   Resp 18   Wt 180 lb 2 oz (81.7 kg)   BMI 25.12 kg/m   Filed Weights   06/29/16 0921  Weight: 180 lb 2 oz (81.7 kg)    GENERAL: Well-nourished well-developed; Alert, no distress and comfortable.  Accompanied by his wife. EYES: no pallor or icterus OROPHARYNX: no thrush or ulceration;  NECK: supple, no masses felt LYMPH:  no palpable lymphadenopathy in the cervical, axillary or inguinal regions LUNGS: clear to auscultation and  No wheeze or crackles HEART/CVS: regular rate & rhythm and no murmurs; No lower extremity edema ABDOMEN:abdomen soft, non-tender and normal bowel sounds Musculoskeletal:no  cyanosis of digits and no clubbing  PSYCH: alert & oriented x 3 with fluent speech NEURO: no focal motor/sensory deficits SKIN:  no rashes or significant lesions; multiple chronic ecchymosis  LABORATORY DATA:  I have reviewed the data as listed    Component Value Date/Time   NA 143 06/29/2016 0855   NA 140 12/17/2012 1229   K 4.2 06/29/2016 0855   K 4.4 12/17/2012 1229   CL 108 06/29/2016 0855   CL 106 12/17/2012 1229   CO2 27 06/29/2016 0855   CO2 26 12/17/2012 1229   GLUCOSE 110 (H) 06/29/2016 0855   GLUCOSE 77 12/17/2012 1229   BUN 17 06/29/2016 0855   BUN 19 (H) 12/17/2012 1229   CREATININE 0.95 06/29/2016 0855   CREATININE 1.05 12/17/2012 1229   CALCIUM 9.0 06/29/2016 0855   CALCIUM 8.4 (L) 12/17/2012 1229   PROT 7.5 06/29/2016 0855   ALBUMIN 4.1 06/29/2016 0855   AST 19 06/29/2016 0855   ALT 17 06/29/2016 0855   ALKPHOS 55 06/29/2016 0855   BILITOT 0.6 06/29/2016 0855   GFRNONAA >60 06/29/2016 0855   GFRNONAA >60 12/17/2012 1229   GFRAA >60 06/29/2016 0855   GFRAA >60 12/17/2012 1229    No results found for: SPEP, UPEP  Lab Results  Component Value Date   WBC 10.9 (H) 06/29/2016   NEUTROABS 8.5 (H) 06/29/2016   HGB 15.0 06/29/2016   HCT 43.7 06/29/2016   MCV 88.0 06/29/2016   PLT 59 (L) 06/29/2016      Chemistry      Component Value Date/Time   NA 143 06/29/2016 0855   NA 140 12/17/2012 1229   K 4.2 06/29/2016 0855   K 4.4 12/17/2012 1229   CL 108 06/29/2016 0855   CL 106 12/17/2012 1229   CO2 27 06/29/2016 0855   CO2 26 12/17/2012 1229   BUN 17 06/29/2016 0855   BUN 19 (H) 12/17/2012 1229   CREATININE 0.95 06/29/2016 0855   CREATININE 1.05 12/17/2012 1229      Component Value Date/Time   CALCIUM 9.0 06/29/2016 0855   CALCIUM 8.4 (L) 12/17/2012 1229   ALKPHOS 55 06/29/2016 0855   AST 19 06/29/2016 0855   ALT 17 06/29/2016 0855   BILITOT 0.6 06/29/2016 0855       RADIOGRAPHIC STUDIES: I have personally reviewed the radiological images  as listed and agreed with the findings in the report. No results found.   ASSESSMENT & PLAN:   No problem-specific Assessment & Plan notes found for this encounter.       Cammie Sickle, MD 06/29/2016 9:47 AM  [facility] OFFICE PROGRESS NOTE  Patient Care Team: [name], MD as PCP - General (Internal Medicine)  SUMMARY OF ONCOLOGIC HISTORY:  2012- CHRONIC THROMBOCYTOPENIA [70-90s] likely ITP [ BMBx; 2012- hypercellular 60%; megakaryocytes/no dyspoiesis; FISH- Neg; Dr.Pandit]; [place]- Abdo-Neg for spleen/liver; HIV/hepatitis-NEG  INTERVAL HISTORY:  This is my first interaction with the patient since I joined the practice September 2016. I reviewed the patient's prior charts/pertinent labs/imaging in detail; findings are summarized above.   A very pleasant 81 year old Caucasian male patient with above history of chronic thrombocytopenia currently on surveillance is here for follow-up.  Patient denies any blood in stools black stools. No nausea no vomiting. No weight loss. No skin rash. No night sweats.    REVIEW OF SYSTEMS:  A complete 10 point review of system is done which is negative except mentioned above/history of present illness.   PAST MEDICAL HISTORY :  Past Medical History:  Diagnosis Date  . CAD (coronary artery disease)   . Colon polyps   . GERD (gastroesophageal reflux disease)   . Gout   . Hyperlipidemia   . IDA (iron deficiency anemia)   . Kidney stones   . MI (myocardial infarction) ([place]) 2004  . Mild anemia   . Thrombocytopenia ([place])     PAST SURGICAL HISTORY :   Past Surgical History:  Procedure Laterality Date  . BLADDER SURGERY    . CORONARY ARTERY BYPASS GRAFT    . INGUINAL HERNIA REPAIR Right   . LASER ABLATION     x 2 prostatic hypertrophy  . TONSILLECTOMY      FAMILY HISTORY :   Family History  Problem Relation Age of Onset  . Breast cancer Mother   . Diabetes Father   . Heart disease Father   . Heart  disease Brother   . Heart disease Brother     SOCIAL HISTORY:   Social History  Substance Use Topics  . Smoking status: Never Smoker  . Smokeless tobacco: Never Used  . Alcohol use No    ALLERGIES:  is allergic to penicillins.  MEDICATIONS:  Current Outpatient Prescriptions  Medication Sig Dispense Refill  . acetaminophen (TYLENOL) 500 MG tablet Take 500 mg by mouth every 6 (six) hours as needed.    Marland Kitchen aspirin 81 MG tablet Take 81 mg by mouth daily.    Marland Kitchen atorvastatin (LIPITOR) 10 MG tablet Take 10 mg by mouth daily. 1/2 tablet    . colchicine 0.6 MG tablet Take 0.6 mg by mouth daily as needed.    . Cyanocobalamin (VITAMIN B 12 PO) Take 1 Dose by mouth daily.    Marland Kitchen diltiazem (DILACOR XR) 240 MG 24 hr capsule Take 240 mg by mouth.    . loratadine (CLARITIN) 10 MG tablet Take 10 mg by mouth daily as needed for allergies.    . metoprolol succinate (TOPROL-XL) 25 MG 24 hr tablet Take 25 mg by mouth 2 (two) times daily.    . Multiple Vitamins-Minerals (MULTIVITAMIN WITH MINERALS) tablet Take 1 tablet by mouth daily.    . multivitamin-lutein (OCUVITE-LUTEIN) CAPS capsule Take 2 capsules by mouth daily.    . Omega-3 Fatty Acids (FISH OIL) 1000 MG CAPS Take 1 capsule by mouth daily.    Marland Kitchen omeprazole (PRILOSEC) 20 MG capsule Take 20 mg by mouth daily.    Marland Kitchen dexamethasone (DECADRON) 4 MG tablet Take 10 pills  A day/ in AM- take with break fast. 40 tablet 0   No current facility-administered medications for this visit.     PHYSICAL EXAMINATION:   BP 135/70 (BP Location: Left Arm, Patient Position: Sitting)   Pulse 69  Temp 97.9 F (36.6 C) (Tympanic)   Resp 18   Wt 180 lb 2 oz (81.7 kg)   BMI 25.12 kg/m   Filed Weights   06/29/16 0921  Weight: 180 lb 2 oz (81.7 kg)    GENERAL: Well-nourished well-developed; Alert, no distress and comfortable.  Accompanied by his wife. EYES: no pallor or icterus OROPHARYNX: no thrush or ulceration;  NECK: supple, no masses felt LYMPH:  no  palpable lymphadenopathy in the cervical, axillary or inguinal regions LUNGS: clear to auscultation and  No wheeze or crackles HEART/CVS: regular rate & rhythm and no murmurs; No lower extremity edema ABDOMEN:abdomen soft, non-tender and normal bowel sounds Musculoskeletal:no cyanosis of digits and no clubbing  PSYCH: alert & oriented x 3 with fluent speech NEURO: no focal motor/sensory deficits SKIN:  no rashes or significant lesions  LABORATORY DATA:  I have reviewed the data as listed    Component Value Date/Time   NA 143 06/29/2016 0855   NA 140 12/17/2012 1229   K 4.2 06/29/2016 0855   K 4.4 12/17/2012 1229   CL 108 06/29/2016 0855   CL 106 12/17/2012 1229   CO2 27 06/29/2016 0855   CO2 26 12/17/2012 1229   GLUCOSE 110 (H) 06/29/2016 0855   GLUCOSE 77 12/17/2012 1229   BUN 17 06/29/2016 0855   BUN 19 (H) 12/17/2012 1229   CREATININE 0.95 06/29/2016 0855   CREATININE 1.05 12/17/2012 1229   CALCIUM 9.0 06/29/2016 0855   CALCIUM 8.4 (L) 12/17/2012 1229   PROT 7.5 06/29/2016 0855   ALBUMIN 4.1 06/29/2016 0855   AST 19 06/29/2016 0855   ALT 17 06/29/2016 0855   ALKPHOS 55 06/29/2016 0855   BILITOT 0.6 06/29/2016 0855   GFRNONAA >60 06/29/2016 0855   GFRNONAA >60 12/17/2012 1229   GFRAA >60 06/29/2016 0855   GFRAA >60 12/17/2012 1229    No results found for: SPEP, UPEP  Lab Results  Component Value Date   WBC 10.9 (H) 06/29/2016   NEUTROABS 8.5 (H) 06/29/2016   HGB 15.0 06/29/2016   HCT 43.7 06/29/2016   MCV 88.0 06/29/2016   PLT 59 (L) 06/29/2016      Chemistry      Component Value Date/Time   NA 143 06/29/2016 0855   NA 140 12/17/2012 1229   K 4.2 06/29/2016 0855   K 4.4 12/17/2012 1229   CL 108 06/29/2016 0855   CL 106 12/17/2012 1229   CO2 27 06/29/2016 0855   CO2 26 12/17/2012 1229   BUN 17 06/29/2016 0855   BUN 19 (H) 12/17/2012 1229   CREATININE 0.95 06/29/2016 0855   CREATININE 1.05 12/17/2012 1229      Component Value Date/Time   CALCIUM  9.0 06/29/2016 0855   CALCIUM 8.4 (L) 12/17/2012 1229   ALKPHOS 55 06/29/2016 0855   AST 19 06/29/2016 0855   ALT 17 06/29/2016 0855   BILITOT 0.6 06/29/2016 0855       RADIOGRAPHIC STUDIES: I have personally reviewed the radiological images as listed and agreed with the findings in the report. No results found.   ASSESSMENT & PLAN:   ITP (idiopathic thrombocytopenic purpura) # Chronic thrombocytopenia- likely ITP. Currently on surveillance. Today platelets are 59- ? Intermittent hematuria- on asprin 81/d.  # recommend Dex 40 mg/day x4 days.   # Melanoma of Left side of arm [Dr.Dasher; July 2017]-   # hematuria- evaluated by [facility].    # weekly labs/ follow up with me in 2 months.  Cammie Sickle, MD 06/29/2016 8:41 PM

## 2016-06-29 NOTE — Assessment & Plan Note (Addendum)
#   Chronic thrombocytopenia- likely ITP. Currently on surveillance. Today platelets are 59- ? Intermittent hematuria- on asprin 81/d.  # recommend Dex 40 mg/day x4 days. Discussed the potential side effects of steroids.  # Melanoma of Left side of arm [Dr.Dasher; July 2017]-   # hematuria- evaluated by [facility].    # weekly labs/ follow up with me in 2 months.

## 2016-07-06 ENCOUNTER — Inpatient Hospital Stay: Payer: PPO

## 2016-07-06 DIAGNOSIS — D693 Immune thrombocytopenic purpura: Secondary | ICD-10-CM

## 2016-07-06 DIAGNOSIS — D696 Thrombocytopenia, unspecified: Secondary | ICD-10-CM | POA: Diagnosis not present

## 2016-07-06 LAB — CBC WITH DIFFERENTIAL/PLATELET
BASOS ABS: 0 10*3/uL (ref 0–0.1)
Basophils Relative: 0 %
Eosinophils Absolute: 0.1 10*3/uL (ref 0–0.7)
Eosinophils Relative: 1 %
HCT: 47.1 % (ref 40.0–52.0)
HEMOGLOBIN: 15.9 g/dL (ref 13.0–18.0)
LYMPHS ABS: 1.6 10*3/uL (ref 1.0–3.6)
MCH: 30 pg (ref 26.0–34.0)
MCHC: 33.8 g/dL (ref 32.0–36.0)
MCV: 88.8 fL (ref 80.0–100.0)
Monocytes Absolute: 1.2 10*3/uL — ABNORMAL HIGH (ref 0.2–1.0)
Monocytes Relative: 8 %
Neutro Abs: 13.2 10*3/uL — ABNORMAL HIGH (ref 1.4–6.5)
PLATELETS: 60 10*3/uL — AB (ref 150–440)
RBC: 5.31 MIL/uL (ref 4.40–5.90)
RDW: 14.6 % — ABNORMAL HIGH (ref 11.5–14.5)
WBC: 16.1 10*3/uL — AB (ref 3.8–10.6)

## 2016-07-10 NOTE — Progress Notes (Signed)
Called patient and left message that platelets have not improved post steroids.  Will continue to monitor on weekly basis.  If continues to drop will look at other options including IVIG infusion.  WBC elevated due to steroids.

## 2016-07-11 ENCOUNTER — Telehealth: Payer: Self-pay | Admitting: *Deleted

## 2016-07-11 NOTE — Telephone Encounter (Signed)
Called patient and LVM that platelets have not improved post steroids.  If they start dropping MD will look at other options including IVIG infusion.  Will continue to monitor weekly labs.  White count high from steroids.

## 2016-07-12 DIAGNOSIS — Z85820 Personal history of malignant melanoma of skin: Secondary | ICD-10-CM | POA: Diagnosis not present

## 2016-07-12 DIAGNOSIS — L570 Actinic keratosis: Secondary | ICD-10-CM | POA: Diagnosis not present

## 2016-07-12 DIAGNOSIS — D225 Melanocytic nevi of trunk: Secondary | ICD-10-CM | POA: Diagnosis not present

## 2016-07-12 DIAGNOSIS — X32XXXA Exposure to sunlight, initial encounter: Secondary | ICD-10-CM | POA: Diagnosis not present

## 2016-07-12 DIAGNOSIS — Z85828 Personal history of other malignant neoplasm of skin: Secondary | ICD-10-CM | POA: Diagnosis not present

## 2016-07-12 DIAGNOSIS — D224 Melanocytic nevi of scalp and neck: Secondary | ICD-10-CM | POA: Diagnosis not present

## 2016-07-13 ENCOUNTER — Inpatient Hospital Stay: Payer: PPO

## 2016-07-13 DIAGNOSIS — D696 Thrombocytopenia, unspecified: Secondary | ICD-10-CM | POA: Diagnosis not present

## 2016-07-13 DIAGNOSIS — D693 Immune thrombocytopenic purpura: Secondary | ICD-10-CM

## 2016-07-13 LAB — CBC WITH DIFFERENTIAL/PLATELET
BASOS ABS: 0.1 10*3/uL (ref 0–0.1)
Eosinophils Absolute: 0.2 10*3/uL (ref 0–0.7)
Eosinophils Relative: 1 %
HEMATOCRIT: 44.3 % (ref 40.0–52.0)
HEMOGLOBIN: 15.1 g/dL (ref 13.0–18.0)
Lymphs Abs: 1.2 10*3/uL (ref 1.0–3.6)
MCH: 30.1 pg (ref 26.0–34.0)
MCHC: 34.1 g/dL (ref 32.0–36.0)
MCV: 88.1 fL (ref 80.0–100.0)
Monocytes Absolute: 1.1 10*3/uL — ABNORMAL HIGH (ref 0.2–1.0)
Monocytes Relative: 7 %
NEUTROS ABS: 11.9 10*3/uL — AB (ref 1.4–6.5)
Platelets: 49 10*3/uL — ABNORMAL LOW (ref 150–440)
RBC: 5.03 MIL/uL (ref 4.40–5.90)
RDW: 14.8 % — AB (ref 11.5–14.5)
WBC: 14.4 10*3/uL — AB (ref 3.8–10.6)

## 2016-07-14 ENCOUNTER — Other Ambulatory Visit: Payer: Self-pay

## 2016-07-14 ENCOUNTER — Telehealth: Payer: Self-pay | Admitting: Internal Medicine

## 2016-07-14 MED ORDER — PREDNISONE 20 MG PO TABS: 80.0000 mg | ORAL_TABLET | Freq: Every day | ORAL | 0 refills | Status: DC

## 2016-07-14 NOTE — Telephone Encounter (Signed)
Spoke to pt re: starting prednisone 80mg  with food x7 day; further direction based on labs; weekly cbc. Other options also discussed- splenectomy one of last options at his age.

## 2016-07-20 ENCOUNTER — Inpatient Hospital Stay: Payer: PPO

## 2016-07-20 DIAGNOSIS — D696 Thrombocytopenia, unspecified: Secondary | ICD-10-CM | POA: Diagnosis not present

## 2016-07-20 DIAGNOSIS — D693 Immune thrombocytopenic purpura: Secondary | ICD-10-CM

## 2016-07-20 LAB — CBC WITH DIFFERENTIAL/PLATELET
Basophils Absolute: 0.1 10*3/uL (ref 0–0.1)
Basophils Relative: 0 %
EOS ABS: 0.1 10*3/uL (ref 0–0.7)
Eosinophils Relative: 1 %
HCT: 44.5 % (ref 40.0–52.0)
HEMOGLOBIN: 15 g/dL (ref 13.0–18.0)
LYMPHS ABS: 1.4 10*3/uL (ref 1.0–3.6)
Lymphocytes Relative: 7 %
MCH: 30 pg (ref 26.0–34.0)
MCHC: 33.7 g/dL (ref 32.0–36.0)
MCV: 89 fL (ref 80.0–100.0)
Monocytes Absolute: 0.8 10*3/uL (ref 0.2–1.0)
Monocytes Relative: 4 %
Neutro Abs: 16.9 10*3/uL — ABNORMAL HIGH (ref 1.4–6.5)
Neutrophils Relative %: 88 %
Platelets: 73 10*3/uL — ABNORMAL LOW (ref 150–440)
RBC: 4.99 MIL/uL (ref 4.40–5.90)
RDW: 14.7 % — ABNORMAL HIGH (ref 11.5–14.5)
WBC: 19.1 10*3/uL — AB (ref 3.8–10.6)

## 2016-07-24 ENCOUNTER — Telehealth: Payer: Self-pay | Admitting: *Deleted

## 2016-07-24 NOTE — Telephone Encounter (Signed)
Called patient and LVM that platelets are improving. Patient to cut dosage or prednisone to 60 mg once a day for one week.  Will recheck CBC and give new instructions at that time.

## 2016-07-24 NOTE — Telephone Encounter (Signed)
-----   Message from Cammie Sickle, MD sent at 07/21/2016  7:05 PM EDT ----- Please Inform patient that his platelets are improving; cut down the dose of prednisone to 60 mg once a day for one week; repeat CBC for instructions to follow.

## 2016-07-25 ENCOUNTER — Telehealth: Payer: Self-pay | Admitting: *Deleted

## 2016-07-25 NOTE — Telephone Encounter (Signed)
Informed that he is not to restart his ASA due to low platelet

## 2016-07-25 NOTE — Telephone Encounter (Signed)
pls count is still in 70's. Per md- please advise patient  NOT   To start back on aspirin for this reason.

## 2016-07-25 NOTE — Telephone Encounter (Signed)
Called to inquire when he can restart his baby aspirin. Please advise.

## 2016-07-27 ENCOUNTER — Inpatient Hospital Stay: Payer: PPO

## 2016-07-27 DIAGNOSIS — D693 Immune thrombocytopenic purpura: Secondary | ICD-10-CM

## 2016-07-27 DIAGNOSIS — D696 Thrombocytopenia, unspecified: Secondary | ICD-10-CM | POA: Diagnosis not present

## 2016-07-27 LAB — CBC WITH DIFFERENTIAL/PLATELET
Basophils Absolute: 0.1 10*3/uL (ref 0–0.1)
Basophils Relative: 1 %
EOS ABS: 0.1 10*3/uL (ref 0–0.7)
HCT: 45.2 % (ref 40.0–52.0)
HEMOGLOBIN: 15.3 g/dL (ref 13.0–18.0)
LYMPHS ABS: 1.6 10*3/uL (ref 1.0–3.6)
Lymphocytes Relative: 10 %
MCH: 30.4 pg (ref 26.0–34.0)
MCHC: 33.9 g/dL (ref 32.0–36.0)
MCV: 89.6 fL (ref 80.0–100.0)
Monocytes Absolute: 1 10*3/uL (ref 0.2–1.0)
Neutro Abs: 13.7 10*3/uL — ABNORMAL HIGH (ref 1.4–6.5)
Platelets: 74 10*3/uL — ABNORMAL LOW (ref 150–440)
RBC: 5.05 MIL/uL (ref 4.40–5.90)
RDW: 15.5 % — ABNORMAL HIGH (ref 11.5–14.5)
WBC: 16.5 10*3/uL — ABNORMAL HIGH (ref 3.8–10.6)

## 2016-07-28 ENCOUNTER — Telehealth: Payer: Self-pay | Admitting: *Deleted

## 2016-07-28 NOTE — Telephone Encounter (Signed)
CBC with Differential  Order: [id number]  Status:  Final result Visible to patient:  No (Not Released) Next appt:  08/03/2016 at 08:45 AM in Oncology ([facility]) Dx:  ITP (idiopathic thrombocytopenic purp...    Ref Range & Units 1d ago 8d ago   WBC 3.8 - 10.6 K/uL 16.5  19.1    RBC 4.40 - 5.90 MIL/uL 5.05 4.99   Hemoglobin 13.0 - 18.0 g/dL 15.3 15.0   HCT 40.0 - 52.0 % 45.2 44.5   MCV 80.0 - 100.0 fL 89.6 89.0   MCH 26.0 - 34.0 pg 30.4 30.0   MCHC 32.0 - 36.0 g/dL 33.9 33.7   RDW 11.5 - 14.5 % 15.5  14.7    Platelets 150 - 440 K/uL 74  73    Neutrophils Relative % % 83% 88%   Neutro Abs 1.4 - 6.5 K/uL 13.7  16.9    Lymphocytes Relative % 10% 7%   Lymphs Abs 1.0 - 3.6 K/uL 1.6 1.4   Monocytes Relative % 6% 4%   Monocytes Absolute 0.2 - 1.0 K/uL 1.0 0.8   Eosinophils Relative % 0% 1%   Eosinophils Absolute 0 - 0.7 K/uL 0.1 0.1   Basophils Relative % 1% 0%   Basophils Absolute 0 - 0.1 K/uL 0.1 0.1  Resulting Agency  [facility]    Specimen Collected: 07/27/16 08:34 Last Resulted: 07/27/16 08:52

## 2016-07-28 NOTE — Telephone Encounter (Signed)
Per Dr B, continue prednisone 60 mg 1 more week then lab/ md sept 7 @ 70 for lab 55 md Wife took message and will tell patient She repeated numbers of results to me and the date and time of appt

## 2016-08-03 ENCOUNTER — Inpatient Hospital Stay: Payer: PPO | Attending: Internal Medicine

## 2016-08-03 ENCOUNTER — Encounter: Payer: Self-pay | Admitting: Internal Medicine

## 2016-08-03 ENCOUNTER — Inpatient Hospital Stay (HOSPITAL_BASED_OUTPATIENT_CLINIC_OR_DEPARTMENT_OTHER): Payer: PPO | Admitting: Internal Medicine

## 2016-08-03 ENCOUNTER — Inpatient Hospital Stay: Payer: PPO

## 2016-08-03 VITALS — BP 145/72 | HR 64 | Temp 98.0°F | Resp 20 | Ht 71.0 in | Wt 177.0 lb

## 2016-08-03 DIAGNOSIS — Z86010 Personal history of colonic polyps: Secondary | ICD-10-CM | POA: Diagnosis not present

## 2016-08-03 DIAGNOSIS — D696 Thrombocytopenia, unspecified: Secondary | ICD-10-CM | POA: Diagnosis not present

## 2016-08-03 DIAGNOSIS — Z85828 Personal history of other malignant neoplasm of skin: Secondary | ICD-10-CM | POA: Diagnosis not present

## 2016-08-03 DIAGNOSIS — D72829 Elevated white blood cell count, unspecified: Secondary | ICD-10-CM | POA: Diagnosis not present

## 2016-08-03 DIAGNOSIS — Z79899 Other long term (current) drug therapy: Secondary | ICD-10-CM

## 2016-08-03 DIAGNOSIS — I2510 Atherosclerotic heart disease of native coronary artery without angina pectoris: Secondary | ICD-10-CM | POA: Diagnosis not present

## 2016-08-03 DIAGNOSIS — D693 Immune thrombocytopenic purpura: Secondary | ICD-10-CM

## 2016-08-03 DIAGNOSIS — I252 Old myocardial infarction: Secondary | ICD-10-CM | POA: Diagnosis not present

## 2016-08-03 DIAGNOSIS — K219 Gastro-esophageal reflux disease without esophagitis: Secondary | ICD-10-CM

## 2016-08-03 DIAGNOSIS — E785 Hyperlipidemia, unspecified: Secondary | ICD-10-CM | POA: Insufficient documentation

## 2016-08-03 DIAGNOSIS — B370 Candidal stomatitis: Secondary | ICD-10-CM | POA: Diagnosis not present

## 2016-08-03 DIAGNOSIS — D509 Iron deficiency anemia, unspecified: Secondary | ICD-10-CM | POA: Diagnosis not present

## 2016-08-03 DIAGNOSIS — Z803 Family history of malignant neoplasm of breast: Secondary | ICD-10-CM | POA: Insufficient documentation

## 2016-08-03 DIAGNOSIS — Z87442 Personal history of urinary calculi: Secondary | ICD-10-CM

## 2016-08-03 DIAGNOSIS — M109 Gout, unspecified: Secondary | ICD-10-CM

## 2016-08-03 DIAGNOSIS — Z7982 Long term (current) use of aspirin: Secondary | ICD-10-CM | POA: Insufficient documentation

## 2016-08-03 LAB — CBC WITH DIFFERENTIAL/PLATELET
BASOS ABS: 0.1 10*3/uL (ref 0–0.1)
Basophils Relative: 1 %
Eosinophils Absolute: 0 10*3/uL (ref 0–0.7)
HEMATOCRIT: 45.1 % (ref 40.0–52.0)
Hemoglobin: 14.7 g/dL (ref 13.0–18.0)
LYMPHS ABS: 0.7 10*3/uL — AB (ref 1.0–3.6)
MCH: 29.4 pg (ref 26.0–34.0)
MCHC: 32.6 g/dL (ref 32.0–36.0)
MCV: 90.1 fL (ref 80.0–100.0)
MONO ABS: 1 10*3/uL (ref 0.2–1.0)
NEUTROS ABS: 22.2 10*3/uL — AB (ref 1.4–6.5)
Neutrophils Relative %: 92 %
PLATELETS: 48 10*3/uL — AB (ref 150–440)
RBC: 5 MIL/uL (ref 4.40–5.90)
RDW: 15.5 % — AB (ref 11.5–14.5)
WBC: 24.1 10*3/uL — ABNORMAL HIGH (ref 3.8–10.6)

## 2016-08-03 NOTE — Assessment & Plan Note (Addendum)
#   Chronic thrombocytopenia- likely ITP. Currently on surveillance. Today platelets are 49 [on prednisone 60mg /day]. Sub-optimal response on steroids. Continue current dose of steroids. Check weekly CBC; and taper steroids  # Discussed multiple treatment options including IV Rituxan weekly 4 potential infusion reactions risk of infections. Also discussed IVIG; N-plate; Promacta; and also splenectomy. After the Lengthy discussion- patient leaning towards IV Rituxan. However, discuss with his family [daughter is in medical field]. I offered to talk to his daughter; he states that he will have his daughter call me.  # Melanoma of Left side of arm [Dr.Dasher; July 2017]-   # weekly labs/ follow up with me in 3 weeks.Check hepatitis panel next week.

## 2016-08-03 NOTE — Progress Notes (Signed)
[facility] OFFICE PROGRESS NOTE  Patient Care Team: [name], MD as PCP - General (Internal Medicine)   SUMMARY OF ONCOLOGIC HISTORY:  2012- CHRONIC THROMBOCYTOPENIA [70-90s] likely ITP [ BMBx; 2012- hypercellular 60%; megakaryocytes/no dyspoiesis; FISH- Neg; Dr.Pandit]; [place]- Abdo-Neg for spleen/liver; HIV/hepatitis-NEG  # July 2017- Melanoma s/p excision [? Stage; Dr.Dasher]  INTERVAL HISTORY:  A very pleasant 81 year old Caucasian male patient with above history of chronic thrombocytopenia currently on surveillance is here for follow-up. Patient had no response to dexamethasone 40 mg a day for 4 days. Is currently on steroids 60 mg of prednisone for the last 2 weeks- slight improvement of the platelets to 70s; but today the currently back to 49.  No hematuria. No bleeding. Is currently off aspirin.  He otherwise denies any blood in stools black stools. No nausea no vomiting. No weight loss. No skin rash. No night sweats.    REVIEW OF SYSTEMS:  A complete 10 point review of system is done which is negative except mentioned above/history of present illness.   PAST MEDICAL HISTORY :  Past Medical History:  Diagnosis Date  . CAD (coronary artery disease)   . Colon polyps   . GERD (gastroesophageal reflux disease)   . Gout   . Hyperlipidemia   . IDA (iron deficiency anemia)   . Kidney stones   . MI (myocardial infarction) ([place]) 2004  . Mild anemia   . Thrombocytopenia ([place])     PAST SURGICAL HISTORY :   Past Surgical History:  Procedure Laterality Date  . BLADDER SURGERY    . CORONARY ARTERY BYPASS GRAFT    . INGUINAL HERNIA REPAIR Right   . LASER ABLATION     x 2 prostatic hypertrophy  . TONSILLECTOMY      FAMILY HISTORY :   Family History  Problem Relation Age of Onset  . Breast cancer Mother   . Diabetes Father   . Heart disease Father   . Heart disease Brother   . Heart disease Brother     SOCIAL HISTORY:   Social History  Substance  Use Topics  . Smoking status: Never Smoker  . Smokeless tobacco: Never Used  . Alcohol use No    ALLERGIES:  is allergic to penicillins.  MEDICATIONS:  Current Outpatient Prescriptions  Medication Sig Dispense Refill  . atorvastatin (LIPITOR) 10 MG tablet Take 10 mg by mouth daily. 1/2 tablet    . Cyanocobalamin (VITAMIN B 12 PO) Take 1 Dose by mouth daily.    Marland Kitchen diltiazem (DILACOR XR) 240 MG 24 hr capsule Take 240 mg by mouth.    . loratadine (CLARITIN) 10 MG tablet Take 10 mg by mouth daily as needed for allergies.    . metoprolol succinate (TOPROL-XL) 25 MG 24 hr tablet Take 25 mg by mouth 2 (two) times daily.    . Multiple Vitamins-Minerals (MULTIVITAMIN WITH MINERALS) tablet Take 1 tablet by mouth daily.    . multivitamin-lutein (OCUVITE-LUTEIN) CAPS capsule Take 2 capsules by mouth daily.    . Omega-3 Fatty Acids (FISH OIL) 1000 MG CAPS Take 1 capsule by mouth daily.    Marland Kitchen omeprazole (PRILOSEC) 20 MG capsule Take 20 mg by mouth daily.    . predniSONE (DELTASONE) 20 MG tablet Take 4 tablets (80 mg total) by mouth daily with breakfast. (Patient taking differently: Take 60 mg by mouth daily with breakfast. Currently taking 60 mg daily) 80 tablet 0  . acetaminophen (TYLENOL) 500 MG tablet Take 500 mg by  mouth every 6 (six) hours as needed.    Marland Kitchen aspirin 81 MG tablet Take 81 mg by mouth daily.     No current facility-administered medications for this visit.     PHYSICAL EXAMINATION:   BP (!) 145/72 (BP Location: Left Arm, Patient Position: Sitting)   Pulse 64   Temp 98 F (36.7 C) (Tympanic)   Resp 20   Ht 5\' 11"  (1.803 m)   Wt 177 lb (80.3 kg)   BMI 24.69 kg/m   Filed Weights   08/03/16 1052  Weight: 177 lb (80.3 kg)    GENERAL: Well-nourished well-developed; Alert, no distress and comfortable.  Accompanied by his wife. EYES: no pallor or icterus OROPHARYNX: no thrush or ulceration;  NECK: supple, no masses felt LYMPH:  no palpable lymphadenopathy in the cervical,  axillary or inguinal regions LUNGS: clear to auscultation and  No wheeze or crackles HEART/CVS: regular rate & rhythm and no murmurs; No lower extremity edema ABDOMEN:abdomen soft, non-tender and normal bowel sounds Musculoskeletal:no cyanosis of digits and no clubbing  PSYCH: alert & oriented x 3 with fluent speech NEURO: no focal motor/sensory deficits SKIN:  no rashes or significant lesions; multiple chronic ecchymosis  LABORATORY DATA:  I have reviewed the data as listed    Component Value Date/Time   NA 143 06/29/2016 0855   NA 140 12/17/2012 1229   K 4.2 06/29/2016 0855   K 4.4 12/17/2012 1229   CL 108 06/29/2016 0855   CL 106 12/17/2012 1229   CO2 27 06/29/2016 0855   CO2 26 12/17/2012 1229   GLUCOSE 110 (H) 06/29/2016 0855   GLUCOSE 77 12/17/2012 1229   BUN 17 06/29/2016 0855   BUN 19 (H) 12/17/2012 1229   CREATININE 0.95 06/29/2016 0855   CREATININE 1.05 12/17/2012 1229   CALCIUM 9.0 06/29/2016 0855   CALCIUM 8.4 (L) 12/17/2012 1229   PROT 7.5 06/29/2016 0855   ALBUMIN 4.1 06/29/2016 0855   AST 19 06/29/2016 0855   ALT 17 06/29/2016 0855   ALKPHOS 55 06/29/2016 0855   BILITOT 0.6 06/29/2016 0855   GFRNONAA >60 06/29/2016 0855   GFRNONAA >60 12/17/2012 1229   GFRAA >60 06/29/2016 0855   GFRAA >60 12/17/2012 1229    No results found for: SPEP, UPEP  Lab Results  Component Value Date   WBC 24.1 (H) 08/03/2016   NEUTROABS 22.2 (H) 08/03/2016   HGB 14.7 08/03/2016   HCT 45.1 08/03/2016   MCV 90.1 08/03/2016   PLT 48 (L) 08/03/2016      Chemistry      Component Value Date/Time   NA 143 06/29/2016 0855   NA 140 12/17/2012 1229   K 4.2 06/29/2016 0855   K 4.4 12/17/2012 1229   CL 108 06/29/2016 0855   CL 106 12/17/2012 1229   CO2 27 06/29/2016 0855   CO2 26 12/17/2012 1229   BUN 17 06/29/2016 0855   BUN 19 (H) 12/17/2012 1229   CREATININE 0.95 06/29/2016 0855   CREATININE 1.05 12/17/2012 1229      Component Value Date/Time   CALCIUM 9.0  06/29/2016 0855   CALCIUM 8.4 (L) 12/17/2012 1229   ALKPHOS 55 06/29/2016 0855   AST 19 06/29/2016 0855   ALT 17 06/29/2016 0855   BILITOT 0.6 06/29/2016 0855       RADIOGRAPHIC STUDIES: I have personally reviewed the radiological images as listed and agreed with the findings in the report. No results found.   ASSESSMENT & PLAN:   ITP (idiopathic  thrombocytopenic purpura) # Chronic thrombocytopenia- likely ITP. Currently on surveillance. Today platelets are 49 [on prednisone 60mg /day]. Sub-optimal response on steroids. Continue current dose of steroids. Check weekly CBC; and taper steroids  # Discussed multiple treatment options including IV Rituxan weekly 4 potential infusion reactions risk of infections. Also discussed IVIG; N-plate; Promacta; and also splenectomy. After the Lengthy discussion- patient leaning towards IV Rituxan. However, discuss with his family [daughter is in medical field]. I offered to talk to his daughter; he states that he will have his daughter call me.  # Melanoma of Left side of arm [Dr.Dasher; July 2017]-   # weekly labs/ follow up with me in 4 weeks.Check hepatitis panel next week.    # Leukocytosis likely from steroids and monitor for now. No signs of infection.    Cammie Sickle, MD 08/03/2016 6:06 PM

## 2016-08-03 NOTE — Progress Notes (Signed)
Pt here for f/u for ITP. He has no medical complaints.  He is currently taking 60 mg of prednisone daily.

## 2016-08-04 ENCOUNTER — Other Ambulatory Visit: Payer: Self-pay | Admitting: Internal Medicine

## 2016-08-07 ENCOUNTER — Telehealth: Payer: Self-pay | Admitting: Internal Medicine

## 2016-08-07 ENCOUNTER — Other Ambulatory Visit: Payer: Self-pay | Admitting: *Deleted

## 2016-08-07 ENCOUNTER — Other Ambulatory Visit: Payer: Self-pay | Admitting: Internal Medicine

## 2016-08-07 DIAGNOSIS — D472 Monoclonal gammopathy: Secondary | ICD-10-CM

## 2016-08-07 MED ORDER — PREDNISONE 20 MG PO TABS: 80.0000 mg | ORAL_TABLET | Freq: Every day | ORAL | 0 refills | Status: DC

## 2016-08-07 NOTE — Telephone Encounter (Signed)
Patient called to notify MD that pharmacy had not received the prednisone prescription that was written on 08/04/2016. He also need clarification of the dose. Dose clarified patient verbalized understanding of dose. Prescription re-entered.Marland Kitchen

## 2016-08-07 NOTE — Telephone Encounter (Signed)
Left messages for the patient's daughter to call us back regarding her dad's plan of care.

## 2016-08-08 ENCOUNTER — Other Ambulatory Visit: Payer: Self-pay | Admitting: *Deleted

## 2016-08-08 MED ORDER — PREDNISONE 20 MG PO TABS: 80.0000 mg | ORAL_TABLET | Freq: Every day | ORAL | 0 refills | Status: DC

## 2016-08-09 ENCOUNTER — Other Ambulatory Visit: Payer: Self-pay | Admitting: Internal Medicine

## 2016-08-10 ENCOUNTER — Inpatient Hospital Stay: Payer: PPO

## 2016-08-10 ENCOUNTER — Other Ambulatory Visit: Payer: Self-pay

## 2016-08-10 VITALS — BP 118/66 | HR 66 | Temp 98.7°F | Resp 18

## 2016-08-10 DIAGNOSIS — D693 Immune thrombocytopenic purpura: Secondary | ICD-10-CM

## 2016-08-10 DIAGNOSIS — D696 Thrombocytopenia, unspecified: Secondary | ICD-10-CM | POA: Diagnosis not present

## 2016-08-10 DIAGNOSIS — D472 Monoclonal gammopathy: Secondary | ICD-10-CM

## 2016-08-10 LAB — CBC WITH DIFFERENTIAL/PLATELET
Basophils Absolute: 0.2 10*3/uL — ABNORMAL HIGH (ref 0–0.1)
Basophils Relative: 1 %
Eosinophils Absolute: 0 10*3/uL (ref 0–0.7)
Eosinophils Relative: 0 %
HEMATOCRIT: 44.9 % (ref 40.0–52.0)
HEMOGLOBIN: 15.2 g/dL (ref 13.0–18.0)
LYMPHS ABS: 1.3 10*3/uL (ref 1.0–3.6)
MCH: 30.1 pg (ref 26.0–34.0)
MCHC: 33.8 g/dL (ref 32.0–36.0)
MCV: 89 fL (ref 80.0–100.0)
Monocytes Absolute: 1 10*3/uL (ref 0.2–1.0)
NEUTROS ABS: 16.5 10*3/uL — AB (ref 1.4–6.5)
Platelets: 52 10*3/uL — ABNORMAL LOW (ref 150–440)
RBC: 5.04 MIL/uL (ref 4.40–5.90)
RDW: 15.6 % — ABNORMAL HIGH (ref 11.5–14.5)
WBC: 19.1 10*3/uL — AB (ref 3.8–10.6)

## 2016-08-10 MED ORDER — DIPHENHYDRAMINE HCL 25 MG PO CAPS
50.0000 mg | ORAL_CAPSULE | Freq: Once | ORAL | Status: AC
  Administered 2016-08-10: 50 mg via ORAL
  Filled 2016-08-10: qty 2

## 2016-08-10 MED ORDER — DIPHENHYDRAMINE HCL 50 MG/ML IJ SOLN
25.0000 mg | Freq: Once | INTRAMUSCULAR | Status: AC
  Administered 2016-08-10: 25 mg via INTRAVENOUS
  Filled 2016-08-10: qty 1

## 2016-08-10 MED ORDER — SODIUM CHLORIDE 0.9 % IV SOLN
375.0000 mg/m2 | Freq: Once | INTRAVENOUS | Status: AC
  Administered 2016-08-10: 800 mg via INTRAVENOUS
  Filled 2016-08-10: qty 50

## 2016-08-10 MED ORDER — ACETAMINOPHEN 325 MG PO TABS
650.0000 mg | ORAL_TABLET | Freq: Once | ORAL | Status: AC
  Administered 2016-08-10: 650 mg via ORAL
  Filled 2016-08-10: qty 2

## 2016-08-10 MED ORDER — SODIUM CHLORIDE 0.9 % IV SOLN
Freq: Once | INTRAVENOUS | Status: AC
  Administered 2016-08-10: 10:00:00 via INTRAVENOUS
  Filled 2016-08-10: qty 1000

## 2016-08-10 NOTE — Progress Notes (Signed)
1450-Patient states, "My chest feels a little tight." No other complaints at this time. Rituxan stopped. VSS. MD, Dr. Rogue Bussing, notified via telephone and aware. Give Benadryl 25mg  IV once at this time. Monitor patient for 30 minutes after giving the Benadryl. Patient may be discharged to home at this time if all symptoms have resolved and patient has no further issues. There was approximately 57ml of Rituxan infusion remaining to be infused. Per MD, Dr. Rogue Bussing, order: do not infuse the remaining volume. Patient may be discharged to home once symptoms resolved.  1537- All symptoms resolved. VSS. Patient states, "The chest tightness is gone. I feel good." No further signs/symptoms at this time. Patient discharged to home.

## 2016-08-11 LAB — KAPPA/LAMBDA LIGHT CHAINS
KAPPA, LAMDA LIGHT CHAIN RATIO: 1.01 (ref 0.26–1.65)
Kappa free light chain: 9.9 mg/L (ref 3.3–19.4)
LAMDA FREE LIGHT CHAINS: 9.8 mg/L (ref 5.7–26.3)

## 2016-08-11 LAB — HEPATITIS B CORE ANTIBODY, IGM: HEP B C IGM: NEGATIVE

## 2016-08-11 LAB — HEPATITIS B SURFACE ANTIGEN: Hepatitis B Surface Ag: NEGATIVE

## 2016-08-14 LAB — MULTIPLE MYELOMA PANEL, SERUM
ALBUMIN/GLOB SERPL: 1.6 (ref 0.7–1.7)
ALPHA2 GLOB SERPL ELPH-MCNC: 0.7 g/dL (ref 0.4–1.0)
Albumin SerPl Elph-Mcnc: 3.5 g/dL (ref 2.9–4.4)
Alpha 1: 0.2 g/dL (ref 0.0–0.4)
B-GLOBULIN SERPL ELPH-MCNC: 0.7 g/dL (ref 0.7–1.3)
GAMMA GLOB SERPL ELPH-MCNC: 0.6 g/dL (ref 0.4–1.8)
GLOBULIN, TOTAL: 2.2 g/dL (ref 2.2–3.9)
IgA: 129 mg/dL (ref 61–437)
IgG (Immunoglobin G), Serum: 565 mg/dL — ABNORMAL LOW (ref 700–1600)
IgM, Serum: 58 mg/dL (ref 15–143)
M PROTEIN SERPL ELPH-MCNC: 0.2 g/dL — AB
Total Protein ELP: 5.7 g/dL — ABNORMAL LOW (ref 6.0–8.5)

## 2016-08-17 ENCOUNTER — Other Ambulatory Visit: Payer: Self-pay | Admitting: Internal Medicine

## 2016-08-17 ENCOUNTER — Other Ambulatory Visit: Payer: Self-pay

## 2016-08-17 ENCOUNTER — Inpatient Hospital Stay: Payer: PPO

## 2016-08-17 VITALS — BP 129/72 | HR 62 | Temp 97.2°F | Resp 18

## 2016-08-17 DIAGNOSIS — D693 Immune thrombocytopenic purpura: Secondary | ICD-10-CM

## 2016-08-17 DIAGNOSIS — D696 Thrombocytopenia, unspecified: Secondary | ICD-10-CM | POA: Diagnosis not present

## 2016-08-17 LAB — CBC WITH DIFFERENTIAL/PLATELET
BASOS PCT: 0 %
Basophils Absolute: 0 10*3/uL (ref 0–0.1)
EOS ABS: 0 10*3/uL (ref 0–0.7)
Eosinophils Relative: 0 %
HCT: 45.2 % (ref 40.0–52.0)
HEMOGLOBIN: 15 g/dL (ref 13.0–18.0)
Lymphocytes Relative: 3 %
Lymphs Abs: 0.7 10*3/uL — ABNORMAL LOW (ref 1.0–3.6)
MCH: 30.1 pg (ref 26.0–34.0)
MCHC: 33.2 g/dL (ref 32.0–36.0)
MCV: 90.5 fL (ref 80.0–100.0)
MONOS PCT: 4 %
Monocytes Absolute: 1 10*3/uL (ref 0.2–1.0)
NEUTROS PCT: 93 %
Neutro Abs: 20.1 10*3/uL — ABNORMAL HIGH (ref 1.4–6.5)
PLATELETS: 61 10*3/uL — AB (ref 150–440)
RBC: 4.99 MIL/uL (ref 4.40–5.90)
RDW: 15.9 % — AB (ref 11.5–14.5)
WBC: 21.8 10*3/uL — ABNORMAL HIGH (ref 3.8–10.6)

## 2016-08-17 MED ORDER — ACETAMINOPHEN 325 MG PO TABS
650.0000 mg | ORAL_TABLET | Freq: Once | ORAL | Status: AC
  Administered 2016-08-17: 650 mg via ORAL
  Filled 2016-08-17: qty 2

## 2016-08-17 MED ORDER — SODIUM CHLORIDE 0.9 % IV SOLN
Freq: Once | INTRAVENOUS | Status: AC
  Administered 2016-08-17: 10:00:00 via INTRAVENOUS
  Filled 2016-08-17: qty 1000

## 2016-08-17 MED ORDER — SODIUM CHLORIDE 0.9 % IV SOLN
375.0000 mg/m2 | Freq: Once | INTRAVENOUS | Status: AC
  Administered 2016-08-17: 800 mg via INTRAVENOUS
  Filled 2016-08-17: qty 50

## 2016-08-17 MED ORDER — DIPHENHYDRAMINE HCL 25 MG PO CAPS
50.0000 mg | ORAL_CAPSULE | Freq: Once | ORAL | Status: AC
  Administered 2016-08-17: 50 mg via ORAL
  Filled 2016-08-17: qty 2

## 2016-08-21 ENCOUNTER — Telehealth: Payer: Self-pay | Admitting: *Deleted

## 2016-08-21 NOTE — Telephone Encounter (Signed)
I called patient and told him to use salt water or Baking Soda rinses and that Dr B does feel it is related to Rituxan, He report that he has white patches on his tongue and I advised he call his PCP. He stated he will call PCP

## 2016-08-21 NOTE — Telephone Encounter (Signed)
Called inquiring what can be done regarding mouth sores he has developed since his chemotherapy was given last week.

## 2016-08-21 NOTE — Telephone Encounter (Signed)
Per Dr Rogue Bussing, he can gargle salt water, does not think it is related to Rituxan. I attempted to return call to patient, but he has no VM. Will attempt again later.

## 2016-08-24 ENCOUNTER — Inpatient Hospital Stay: Payer: PPO

## 2016-08-24 ENCOUNTER — Encounter: Payer: Self-pay | Admitting: Internal Medicine

## 2016-08-24 ENCOUNTER — Inpatient Hospital Stay (HOSPITAL_BASED_OUTPATIENT_CLINIC_OR_DEPARTMENT_OTHER): Payer: PPO | Admitting: Internal Medicine

## 2016-08-24 VITALS — BP 124/77 | HR 109 | Temp 97.5°F | Resp 19 | Ht 71.0 in | Wt 174.2 lb

## 2016-08-24 DIAGNOSIS — B370 Candidal stomatitis: Secondary | ICD-10-CM

## 2016-08-24 DIAGNOSIS — M109 Gout, unspecified: Secondary | ICD-10-CM

## 2016-08-24 DIAGNOSIS — I252 Old myocardial infarction: Secondary | ICD-10-CM

## 2016-08-24 DIAGNOSIS — Z85828 Personal history of other malignant neoplasm of skin: Secondary | ICD-10-CM

## 2016-08-24 DIAGNOSIS — D693 Immune thrombocytopenic purpura: Secondary | ICD-10-CM

## 2016-08-24 DIAGNOSIS — Z79899 Other long term (current) drug therapy: Secondary | ICD-10-CM

## 2016-08-24 DIAGNOSIS — Z86010 Personal history of colonic polyps: Secondary | ICD-10-CM

## 2016-08-24 DIAGNOSIS — I2510 Atherosclerotic heart disease of native coronary artery without angina pectoris: Secondary | ICD-10-CM | POA: Diagnosis not present

## 2016-08-24 DIAGNOSIS — D509 Iron deficiency anemia, unspecified: Secondary | ICD-10-CM

## 2016-08-24 DIAGNOSIS — D696 Thrombocytopenia, unspecified: Secondary | ICD-10-CM

## 2016-08-24 DIAGNOSIS — K219 Gastro-esophageal reflux disease without esophagitis: Secondary | ICD-10-CM

## 2016-08-24 DIAGNOSIS — D72829 Elevated white blood cell count, unspecified: Secondary | ICD-10-CM

## 2016-08-24 DIAGNOSIS — Z7982 Long term (current) use of aspirin: Secondary | ICD-10-CM

## 2016-08-24 DIAGNOSIS — E785 Hyperlipidemia, unspecified: Secondary | ICD-10-CM

## 2016-08-24 DIAGNOSIS — Z87442 Personal history of urinary calculi: Secondary | ICD-10-CM

## 2016-08-24 DIAGNOSIS — Z803 Family history of malignant neoplasm of breast: Secondary | ICD-10-CM

## 2016-08-24 LAB — CBC WITH DIFFERENTIAL/PLATELET
BASOS ABS: 0 10*3/uL (ref 0–0.1)
BASOS PCT: 0 %
EOS ABS: 0 10*3/uL (ref 0–0.7)
Eosinophils Relative: 0 %
HEMATOCRIT: 45.7 % (ref 40.0–52.0)
HEMOGLOBIN: 15.5 g/dL (ref 13.0–18.0)
Lymphocytes Relative: 3 %
Lymphs Abs: 0.7 10*3/uL — ABNORMAL LOW (ref 1.0–3.6)
MCH: 30.3 pg (ref 26.0–34.0)
MCHC: 33.9 g/dL (ref 32.0–36.0)
MCV: 89.5 fL (ref 80.0–100.0)
MONOS PCT: 4 %
Monocytes Absolute: 0.8 10*3/uL (ref 0.2–1.0)
NEUTROS ABS: 19.4 10*3/uL — AB (ref 1.4–6.5)
NEUTROS PCT: 93 %
Platelets: 59 10*3/uL — ABNORMAL LOW (ref 150–440)
RBC: 5.1 MIL/uL (ref 4.40–5.90)
RDW: 16.2 % — ABNORMAL HIGH (ref 11.5–14.5)
WBC: 20.9 10*3/uL — AB (ref 3.8–10.6)

## 2016-08-24 MED ORDER — DIPHENHYDRAMINE HCL 25 MG PO CAPS
50.0000 mg | ORAL_CAPSULE | Freq: Once | ORAL | Status: AC
  Administered 2016-08-24: 50 mg via ORAL
  Filled 2016-08-24: qty 2

## 2016-08-24 MED ORDER — NYSTATIN 100000 UNIT/ML MT SUSP: 5.0000 mL | Freq: Four times a day (QID) | OROMUCOSAL | 1 refills | Status: DC

## 2016-08-24 MED ORDER — SODIUM CHLORIDE 0.9 % IV SOLN: 375.0000 mg/m2 | Freq: Once | INTRAVENOUS | Status: DC

## 2016-08-24 MED ORDER — SODIUM CHLORIDE 0.9 % IV SOLN
Freq: Once | INTRAVENOUS | Status: AC
  Administered 2016-08-24: 10:00:00 via INTRAVENOUS
  Filled 2016-08-24: qty 1000

## 2016-08-24 MED ORDER — ACETAMINOPHEN 325 MG PO TABS
650.0000 mg | ORAL_TABLET | Freq: Once | ORAL | Status: AC
  Administered 2016-08-24: 650 mg via ORAL
  Filled 2016-08-24: qty 2

## 2016-08-24 MED ORDER — SODIUM CHLORIDE 0.9 % IV SOLN
800.0000 mg | Freq: Once | INTRAVENOUS | Status: DC
  Filled 2016-08-24: qty 80

## 2016-08-24 MED ORDER — SODIUM CHLORIDE 0.9 % IV SOLN
800.0000 mg | Freq: Once | INTRAVENOUS | Status: AC
  Administered 2016-08-24: 800 mg via INTRAVENOUS
  Filled 2016-08-24: qty 50

## 2016-08-24 NOTE — Assessment & Plan Note (Addendum)
#   Chronic thrombocytopenia- likely ITP. S/p Rituxan x2;  Today platelets are 59 [on prednisone 80mg /day]. Recommend tapering prednisone to 20 mg a day over the next 3 weeks; written directions given.  # proceed with rituxan # 3 today. Again discussed that response Rituxan might take up to 2-3 months  # Oral thrush- recommend nystatin.   # weekly labs/ follow up with me in 3 weeks.

## 2016-08-24 NOTE — Progress Notes (Signed)
[facility] OFFICE PROGRESS NOTE  Patient Care Team: [name], MD as PCP - General (Internal Medicine)   SUMMARY OF ONCOLOGIC HISTORY:  2012- CHRONIC THROMBOCYTOPENIA [70-90s] likely ITP [ BMBx; 2012- hypercellular 60%; megakaryocytes/no dyspoiesis; FISH- Neg; Dr.Pandit]; [place]- Abdo-Neg for spleen/liver; HIV/hepatitis-NEG  # July 2017- Melanoma s/p excision [? Stage; Dr.Dasher]  INTERVAL HISTORY:  A very pleasant 81 year old Caucasian male patient with ITP suboptimal response to steroids; currently on prednisone 80 mg a day; status post Rituxan weekly 2 is here for follow-up.  Patient complains of mild hoarseness of throat; also complains of pain with swallowing. No significant reactions from Rituxan. No hematuria. No bleeding. Is currently off aspirin.  He otherwise denies any blood in stools black stools. No nausea no vomiting. No weight loss. No skin rash. No night sweats.    REVIEW OF SYSTEMS:  A complete 10 point review of system is done which is negative except mentioned above/history of present illness.   PAST MEDICAL HISTORY :  Past Medical History:  Diagnosis Date  . CAD (coronary artery disease)   . Colon polyps   . GERD (gastroesophageal reflux disease)   . Gout   . Hyperlipidemia   . IDA (iron deficiency anemia)   . Kidney stones   . MI (myocardial infarction) ([place]) 2004  . Mild anemia   . Thrombocytopenia ([place])     PAST SURGICAL HISTORY :   Past Surgical History:  Procedure Laterality Date  . BLADDER SURGERY    . CORONARY ARTERY BYPASS GRAFT    . INGUINAL HERNIA REPAIR Right   . LASER ABLATION     x 2 prostatic hypertrophy  . TONSILLECTOMY      FAMILY HISTORY :   Family History  Problem Relation Age of Onset  . Breast cancer Mother   . Diabetes Father   . Heart disease Father   . Heart disease Brother   . Heart disease Brother     SOCIAL HISTORY:   Social History  Substance Use Topics  . Smoking status: Never Smoker  .  Smokeless tobacco: Never Used  . Alcohol use No    ALLERGIES:  is allergic to penicillins.  MEDICATIONS:  Current Outpatient Prescriptions  Medication Sig Dispense Refill  . acetaminophen (TYLENOL) 500 MG tablet Take 500 mg by mouth every 6 (six) hours as needed.    Marland Kitchen atorvastatin (LIPITOR) 10 MG tablet Take 10 mg by mouth daily. 1/2 tablet    . Cyanocobalamin (VITAMIN B 12 PO) Take 1 Dose by mouth daily.    Marland Kitchen diltiazem (DILACOR XR) 240 MG 24 hr capsule Take 240 mg by mouth.    . loratadine (CLARITIN) 10 MG tablet Take 10 mg by mouth daily as needed for allergies.    . metoprolol succinate (TOPROL-XL) 25 MG 24 hr tablet Take 25 mg by mouth 2 (two) times daily.    . Multiple Vitamins-Minerals (MULTIVITAMIN WITH MINERALS) tablet Take 1 tablet by mouth daily.    . multivitamin-lutein (OCUVITE-LUTEIN) CAPS capsule Take 2 capsules by mouth daily.    . Omega-3 Fatty Acids (FISH OIL) 1000 MG CAPS Take 1 capsule by mouth daily.    Marland Kitchen omeprazole (PRILOSEC) 20 MG capsule Take 20 mg by mouth daily.    . predniSONE (DELTASONE) 20 MG tablet Take 4 tablets (80 mg total) by mouth daily with breakfast. 80 tablet 0  . nystatin (MYCOSTATIN) 100000 UNIT/ML suspension Take 5 mLs (500,000 Units total) by mouth 4 (four) times daily. [place]  mL 1   No current facility-administered medications for this visit.     PHYSICAL EXAMINATION:   BP 124/77 (BP Location: Right Arm, Patient Position: Sitting)   Pulse (!) 109   Temp 97.5 F (36.4 C) (Tympanic)   Resp 19   Ht 5\' 11"  (1.803 m)   Wt 174 lb 3.2 oz (79 kg)   BMI 24.30 kg/m   Filed Weights   08/24/16 0838  Weight: 174 lb 3.2 oz (79 kg)    GENERAL: Well-nourished well-developed; Alert, no distress and comfortable.  Accompanied by his wife. EYES: no pallor or icterus OROPHARYNX: no thrush or ulceration;  NECK: supple, no masses felt LYMPH:  no palpable lymphadenopathy in the cervical, axillary or inguinal regions LUNGS: clear to auscultation and  No  wheeze or crackles HEART/CVS: regular rate & rhythm and no murmurs; No lower extremity edema ABDOMEN:abdomen soft, non-tender and normal bowel sounds Musculoskeletal:no cyanosis of digits and no clubbing  PSYCH: alert & oriented x 3 with fluent speech NEURO: no focal motor/sensory deficits SKIN:  no rashes or significant lesions; multiple chronic ecchymosis  LABORATORY DATA:  I have reviewed the data as listed    Component Value Date/Time   NA 143 06/29/2016 0855   NA 140 12/17/2012 1229   K 4.2 06/29/2016 0855   K 4.4 12/17/2012 1229   CL 108 06/29/2016 0855   CL 106 12/17/2012 1229   CO2 27 06/29/2016 0855   CO2 26 12/17/2012 1229   GLUCOSE 110 (H) 06/29/2016 0855   GLUCOSE 77 12/17/2012 1229   BUN 17 06/29/2016 0855   BUN 19 (H) 12/17/2012 1229   CREATININE 0.95 06/29/2016 0855   CREATININE 1.05 12/17/2012 1229   CALCIUM 9.0 06/29/2016 0855   CALCIUM 8.4 (L) 12/17/2012 1229   PROT 7.5 06/29/2016 0855   ALBUMIN 4.1 06/29/2016 0855   AST 19 06/29/2016 0855   ALT 17 06/29/2016 0855   ALKPHOS 55 06/29/2016 0855   BILITOT 0.6 06/29/2016 0855   GFRNONAA >60 06/29/2016 0855   GFRNONAA >60 12/17/2012 1229   GFRAA >60 06/29/2016 0855   GFRAA >60 12/17/2012 1229    No results found for: SPEP, UPEP  Lab Results  Component Value Date   WBC 20.9 (H) 08/24/2016   NEUTROABS 19.4 (H) 08/24/2016   HGB 15.5 08/24/2016   HCT 45.7 08/24/2016   MCV 89.5 08/24/2016   PLT 59 (L) 08/24/2016      Chemistry      Component Value Date/Time   NA 143 06/29/2016 0855   NA 140 12/17/2012 1229   K 4.2 06/29/2016 0855   K 4.4 12/17/2012 1229   CL 108 06/29/2016 0855   CL 106 12/17/2012 1229   CO2 27 06/29/2016 0855   CO2 26 12/17/2012 1229   BUN 17 06/29/2016 0855   BUN 19 (H) 12/17/2012 1229   CREATININE 0.95 06/29/2016 0855   CREATININE 1.05 12/17/2012 1229      Component Value Date/Time   CALCIUM 9.0 06/29/2016 0855   CALCIUM 8.4 (L) 12/17/2012 1229   ALKPHOS 55 06/29/2016  0855   AST 19 06/29/2016 0855   ALT 17 06/29/2016 0855   BILITOT 0.6 06/29/2016 0855       RADIOGRAPHIC STUDIES: I have personally reviewed the radiological images as listed and agreed with the findings in the report. No results found.   ASSESSMENT & PLAN:   ITP (idiopathic thrombocytopenic purpura) # Chronic thrombocytopenia- likely ITP. S/p Rituxan x2;  Today platelets are 59 [on prednisone  80mg /day]. Recommend tapering prednisone to 20 mg a day over the next 3 weeks; written directions given.  # proceed with rituxan # 3 today. Again discussed that response Rituxan might take up to 2-3 months  # Oral thrush- recommend nystatin.   # weekly labs/ follow up with me in 3 weeks.   # Leukocytosis likely from steroids and monitor for now. No signs of infection.    Cammie Sickle, MD 08/24/2016 4:28 PM

## 2016-08-24 NOTE — Progress Notes (Signed)
Pt reports visual changes in the day after napping (sees colors change on wall)  and is concerned it is coming from prednisone and also reports a sore mouth.

## 2016-08-24 NOTE — Patient Instructions (Signed)
Start taking prednisone 60 [3 pills]- x1 week; 40 mg [2 pills x1 week]; and 20mg  [1pill]- do not stop until told.

## 2016-08-25 ENCOUNTER — Telehealth: Payer: Self-pay | Admitting: *Deleted

## 2016-08-25 MED ORDER — FLUCONAZOLE 100 MG PO TABS: 100.0000 mg | ORAL_TABLET | Freq: Every day | ORAL | 0 refills | Status: DC

## 2016-08-25 NOTE — Telephone Encounter (Signed)
Called to report that pharmacy will not refill his MMW stating Insurance will not cover it until next week (too Early) To pay for it out of pocket will cost $103 and he cannot afford that. Please advise.

## 2016-08-25 NOTE — Telephone Encounter (Signed)
Informed patient that new rx sent to pharmacy

## 2016-08-31 ENCOUNTER — Inpatient Hospital Stay: Payer: PPO

## 2016-08-31 ENCOUNTER — Ambulatory Visit: Payer: PPO | Admitting: Internal Medicine

## 2016-08-31 ENCOUNTER — Inpatient Hospital Stay: Payer: PPO | Attending: Internal Medicine

## 2016-08-31 ENCOUNTER — Telehealth: Payer: Self-pay | Admitting: *Deleted

## 2016-08-31 VITALS — BP 123/67 | HR 81 | Temp 99.9°F | Resp 18

## 2016-08-31 DIAGNOSIS — D693 Immune thrombocytopenic purpura: Secondary | ICD-10-CM

## 2016-08-31 DIAGNOSIS — Z5112 Encounter for antineoplastic immunotherapy: Secondary | ICD-10-CM | POA: Insufficient documentation

## 2016-08-31 DIAGNOSIS — B370 Candidal stomatitis: Secondary | ICD-10-CM | POA: Diagnosis not present

## 2016-08-31 LAB — CBC WITH DIFFERENTIAL/PLATELET
Basophils Absolute: 0.1 10*3/uL (ref 0–0.1)
Basophils Relative: 0 %
EOS ABS: 0 10*3/uL (ref 0–0.7)
Eosinophils Relative: 0 %
HEMATOCRIT: 44.8 % (ref 40.0–52.0)
HEMOGLOBIN: 15.2 g/dL (ref 13.0–18.0)
LYMPHS ABS: 0.9 10*3/uL — AB (ref 1.0–3.6)
Lymphocytes Relative: 4 %
MCH: 30.1 pg (ref 26.0–34.0)
MCHC: 33.9 g/dL (ref 32.0–36.0)
MCV: 88.9 fL (ref 80.0–100.0)
MONO ABS: 0.9 10*3/uL (ref 0.2–1.0)
MONOS PCT: 4 %
NEUTROS ABS: 21 10*3/uL — AB (ref 1.4–6.5)
NEUTROS PCT: 92 %
Platelets: 82 10*3/uL — ABNORMAL LOW (ref 150–440)
RBC: 5.03 MIL/uL (ref 4.40–5.90)
RDW: 16.1 % — AB (ref 11.5–14.5)
WBC: 22.9 10*3/uL — ABNORMAL HIGH (ref 3.8–10.6)

## 2016-08-31 MED ORDER — SODIUM CHLORIDE 0.9 % IV SOLN
375.0000 mg/m2 | Freq: Once | INTRAVENOUS | Status: AC
  Administered 2016-08-31: 800 mg via INTRAVENOUS
  Filled 2016-08-31: qty 80

## 2016-08-31 MED ORDER — SODIUM CHLORIDE 0.9 % IV SOLN
Freq: Once | INTRAVENOUS | Status: AC
  Administered 2016-08-31: 10:00:00 via INTRAVENOUS
  Filled 2016-08-31: qty 1000

## 2016-08-31 MED ORDER — DIPHENHYDRAMINE HCL 25 MG PO CAPS
50.0000 mg | ORAL_CAPSULE | Freq: Once | ORAL | Status: AC
  Administered 2016-08-31: 50 mg via ORAL
  Filled 2016-08-31: qty 2

## 2016-08-31 MED ORDER — ACETAMINOPHEN 325 MG PO TABS
650.0000 mg | ORAL_TABLET | Freq: Once | ORAL | Status: AC
  Administered 2016-08-31: 650 mg via ORAL
  Filled 2016-08-31: qty 2

## 2016-08-31 NOTE — Telephone Encounter (Signed)
Patients reports that his mouth is so dry that his tongue dried out and was bleeding. Needs refill on Prednisone and Diflucan. Asking for relief. He called Dr Wilkie Aye who said if he has open sores he needs to be seen. He is asking if Dr B needs to see him. Of note, Dr Wilkie Aye does not file [facility] or [facility] so patient will have to pay out of pocket to see her

## 2016-08-31 NOTE — Telephone Encounter (Addendum)
Advised patient to contact his PCP and that md does not want to refill Diflucan or Prednisone

## 2016-09-01 DIAGNOSIS — E875 Hyperkalemia: Secondary | ICD-10-CM | POA: Diagnosis not present

## 2016-09-05 DIAGNOSIS — R509 Fever, unspecified: Secondary | ICD-10-CM | POA: Diagnosis not present

## 2016-09-06 DIAGNOSIS — Z79899 Other long term (current) drug therapy: Secondary | ICD-10-CM | POA: Diagnosis not present

## 2016-09-06 DIAGNOSIS — I2510 Atherosclerotic heart disease of native coronary artery without angina pectoris: Secondary | ICD-10-CM | POA: Diagnosis not present

## 2016-09-06 DIAGNOSIS — D649 Anemia, unspecified: Secondary | ICD-10-CM | POA: Diagnosis not present

## 2016-09-06 DIAGNOSIS — I480 Paroxysmal atrial fibrillation: Secondary | ICD-10-CM | POA: Diagnosis not present

## 2016-09-06 DIAGNOSIS — I700 Atherosclerosis of aorta: Secondary | ICD-10-CM | POA: Insufficient documentation

## 2016-09-06 DIAGNOSIS — I10 Essential (primary) hypertension: Secondary | ICD-10-CM | POA: Diagnosis not present

## 2016-09-06 DIAGNOSIS — R509 Fever, unspecified: Secondary | ICD-10-CM | POA: Diagnosis not present

## 2016-09-06 DIAGNOSIS — D696 Thrombocytopenia, unspecified: Secondary | ICD-10-CM | POA: Diagnosis not present

## 2016-09-07 ENCOUNTER — Inpatient Hospital Stay: Payer: PPO

## 2016-09-11 ENCOUNTER — Other Ambulatory Visit: Payer: Self-pay

## 2016-09-11 DIAGNOSIS — D693 Immune thrombocytopenic purpura: Secondary | ICD-10-CM

## 2016-09-12 ENCOUNTER — Emergency Department: Payer: PPO

## 2016-09-12 ENCOUNTER — Inpatient Hospital Stay
Admission: EM | Admit: 2016-09-12 | Discharge: 2016-09-15 | DRG: 871 | Disposition: A | Discharge status: Home / Self Care | Payer: PPO | Attending: Internal Medicine | Admitting: Internal Medicine

## 2016-09-12 ENCOUNTER — Encounter: Payer: Self-pay | Admitting: *Deleted

## 2016-09-12 DIAGNOSIS — Z880 Allergy status to penicillin: Secondary | ICD-10-CM

## 2016-09-12 DIAGNOSIS — E785 Hyperlipidemia, unspecified: Secondary | ICD-10-CM | POA: Diagnosis not present

## 2016-09-12 DIAGNOSIS — D509 Iron deficiency anemia, unspecified: Secondary | ICD-10-CM | POA: Diagnosis present

## 2016-09-12 DIAGNOSIS — J189 Pneumonia, unspecified organism: Secondary | ICD-10-CM

## 2016-09-12 DIAGNOSIS — Z951 Presence of aortocoronary bypass graft: Secondary | ICD-10-CM

## 2016-09-12 DIAGNOSIS — D472 Monoclonal gammopathy: Secondary | ICD-10-CM | POA: Diagnosis present

## 2016-09-12 DIAGNOSIS — Z7952 Long term (current) use of systemic steroids: Secondary | ICD-10-CM

## 2016-09-12 DIAGNOSIS — I252 Old myocardial infarction: Secondary | ICD-10-CM

## 2016-09-12 DIAGNOSIS — R0602 Shortness of breath: Secondary | ICD-10-CM | POA: Diagnosis not present

## 2016-09-12 DIAGNOSIS — R0902 Hypoxemia: Secondary | ICD-10-CM | POA: Diagnosis not present

## 2016-09-12 DIAGNOSIS — B370 Candidal stomatitis: Secondary | ICD-10-CM | POA: Diagnosis present

## 2016-09-12 DIAGNOSIS — N179 Acute kidney failure, unspecified: Secondary | ICD-10-CM | POA: Diagnosis not present

## 2016-09-12 DIAGNOSIS — K219 Gastro-esophageal reflux disease without esophagitis: Secondary | ICD-10-CM | POA: Diagnosis not present

## 2016-09-12 DIAGNOSIS — R748 Abnormal levels of other serum enzymes: Secondary | ICD-10-CM | POA: Diagnosis not present

## 2016-09-12 DIAGNOSIS — A419 Sepsis, unspecified organism: Secondary | ICD-10-CM | POA: Diagnosis not present

## 2016-09-12 DIAGNOSIS — T380X5A Adverse effect of glucocorticoids and synthetic analogues, initial encounter: Secondary | ICD-10-CM | POA: Diagnosis present

## 2016-09-12 DIAGNOSIS — I2510 Atherosclerotic heart disease of native coronary artery without angina pectoris: Secondary | ICD-10-CM | POA: Diagnosis present

## 2016-09-12 DIAGNOSIS — D693 Immune thrombocytopenic purpura: Secondary | ICD-10-CM | POA: Diagnosis not present

## 2016-09-12 DIAGNOSIS — Z79899 Other long term (current) drug therapy: Secondary | ICD-10-CM | POA: Diagnosis not present

## 2016-09-12 DIAGNOSIS — J302 Other seasonal allergic rhinitis: Secondary | ICD-10-CM | POA: Diagnosis not present

## 2016-09-12 DIAGNOSIS — R262 Difficulty in walking, not elsewhere classified: Secondary | ICD-10-CM

## 2016-09-12 DIAGNOSIS — J9601 Acute respiratory failure with hypoxia: Secondary | ICD-10-CM | POA: Diagnosis present

## 2016-09-12 DIAGNOSIS — I248 Other forms of acute ischemic heart disease: Secondary | ICD-10-CM | POA: Diagnosis present

## 2016-09-12 HISTORY — DX: Malignant (primary) neoplasm, unspecified: C80.1

## 2016-09-12 LAB — BASIC METABOLIC PANEL
Anion gap: 8 (ref 5–15)
BUN: 28 mg/dL — AB (ref 6–20)
CHLORIDE: 105 mmol/L (ref 101–111)
CO2: 26 mmol/L (ref 22–32)
CREATININE: 1.39 mg/dL — AB (ref 0.61–1.24)
Calcium: 8.7 mg/dL — ABNORMAL LOW (ref 8.9–10.3)
GFR calc Af Amer: 54 mL/min — ABNORMAL LOW (ref >60)
GFR calc non Af Amer: 47 mL/min — ABNORMAL LOW (ref >60)
Glucose, Bld: 109 mg/dL — ABNORMAL HIGH (ref 65–99)
Potassium: 4.4 mmol/L (ref 3.5–5.1)
SODIUM: 139 mmol/L (ref 135–145)

## 2016-09-12 LAB — CBC
HCT: 39.2 % — ABNORMAL LOW (ref 40.0–52.0)
Hemoglobin: 13.1 g/dL (ref 13.0–18.0)
MCH: 29.9 pg (ref 26.0–34.0)
MCHC: 33.3 g/dL (ref 32.0–36.0)
MCV: 89.7 fL (ref 80.0–100.0)
PLATELETS: 147 10*3/uL — AB (ref 150–440)
RBC: 4.37 MIL/uL — ABNORMAL LOW (ref 4.40–5.90)
RDW: 16.5 % — AB (ref 11.5–14.5)
WBC: 16.2 10*3/uL — AB (ref 3.8–10.6)

## 2016-09-12 LAB — LACTIC ACID, PLASMA
Lactic Acid, Venous: 3 mmol/L (ref 0.5–1.9)
Lactic Acid, Venous: 3.9 mmol/L (ref 0.5–1.9)

## 2016-09-12 LAB — TROPONIN I: Troponin I: 0.03 ng/mL (ref <0.03)

## 2016-09-12 MED ORDER — ACETAMINOPHEN 650 MG RE SUPP: 650.0000 mg | Freq: Four times a day (QID) | RECTAL | Status: DC | PRN

## 2016-09-12 MED ORDER — BISACODYL 5 MG PO TBEC: 5.0000 mg | DELAYED_RELEASE_TABLET | Freq: Every day | ORAL | Status: DC | PRN

## 2016-09-12 MED ORDER — PREDNISONE 50 MG PO TABS
80.0000 mg | ORAL_TABLET | Freq: Every day | ORAL | Status: DC
  Administered 2016-09-13 – 2016-09-14 (×2): 80 mg via ORAL
  Filled 2016-09-12 (×2): qty 1

## 2016-09-12 MED ORDER — NYSTATIN 100000 UNIT/ML MT SUSP
5.0000 mL | Freq: Four times a day (QID) | OROMUCOSAL | Status: DC
  Administered 2016-09-12 – 2016-09-15 (×10): 500000 [IU] via ORAL
  Filled 2016-09-12 (×10): qty 5

## 2016-09-12 MED ORDER — DEXTROSE 5 % IV SOLN
1.0000 g | INTRAVENOUS | Status: DC
  Filled 2016-09-12: qty 10

## 2016-09-12 MED ORDER — METOPROLOL SUCCINATE ER 25 MG PO TB24
25.0000 mg | ORAL_TABLET | Freq: Two times a day (BID) | ORAL | Status: DC
  Administered 2016-09-12 – 2016-09-15 (×6): 25 mg via ORAL
  Filled 2016-09-12 (×6): qty 1

## 2016-09-12 MED ORDER — SODIUM CHLORIDE 0.9% FLUSH
3.0000 mL | Freq: Two times a day (BID) | INTRAVENOUS | Status: DC
  Administered 2016-09-12 – 2016-09-15 (×6): 3 mL via INTRAVENOUS

## 2016-09-12 MED ORDER — OCUVITE-LUTEIN PO CAPS
2.0000 | ORAL_CAPSULE | Freq: Every day | ORAL | Status: DC
  Administered 2016-09-13 – 2016-09-15 (×3): 2 via ORAL
  Filled 2016-09-12 (×3): qty 2

## 2016-09-12 MED ORDER — ONDANSETRON HCL 4 MG PO TABS: 4.0000 mg | ORAL_TABLET | Freq: Four times a day (QID) | ORAL | Status: DC | PRN

## 2016-09-12 MED ORDER — ADULT MULTIVITAMIN W/MINERALS CH
1.0000 | ORAL_TABLET | Freq: Every day | ORAL | Status: DC
  Administered 2016-09-13 – 2016-09-15 (×3): 1 via ORAL
  Filled 2016-09-12 (×3): qty 1

## 2016-09-12 MED ORDER — OMEGA-3-ACID ETHYL ESTERS 1 G PO CAPS
1.0000 g | ORAL_CAPSULE | Freq: Two times a day (BID) | ORAL | Status: DC
Start: 2016-09-12 — End: 2016-09-15
  Administered 2016-09-12 – 2016-09-15 (×6): 1 g via ORAL
  Filled 2016-09-12 (×6): qty 1

## 2016-09-12 MED ORDER — DEXTROSE 5 % IV SOLN
1.0000 g | Freq: Once | INTRAVENOUS | Status: AC
  Administered 2016-09-12: 1 g via INTRAVENOUS
  Filled 2016-09-12: qty 10

## 2016-09-12 MED ORDER — ATORVASTATIN CALCIUM 20 MG PO TABS
10.0000 mg | ORAL_TABLET | Freq: Every day | ORAL | Status: DC
  Administered 2016-09-13 – 2016-09-15 (×3): 10 mg via ORAL
  Filled 2016-09-12 (×3): qty 1

## 2016-09-12 MED ORDER — DEXTROSE 5 % IV SOLN
500.0000 mg | Freq: Once | INTRAVENOUS | Status: AC
  Administered 2016-09-12: 500 mg via INTRAVENOUS
  Filled 2016-09-12: qty 500

## 2016-09-12 MED ORDER — ACETAMINOPHEN 325 MG PO TABS: 650.0000 mg | ORAL_TABLET | Freq: Four times a day (QID) | ORAL | Status: DC | PRN

## 2016-09-12 MED ORDER — LORATADINE 10 MG PO TABS
10.0000 mg | ORAL_TABLET | Freq: Every day | ORAL | Status: DC | PRN
  Filled 2016-09-12: qty 1

## 2016-09-12 MED ORDER — FLUCONAZOLE 100 MG PO TABS
100.0000 mg | ORAL_TABLET | Freq: Every day | ORAL | Status: DC
  Administered 2016-09-13 – 2016-09-14 (×2): 100 mg via ORAL
  Filled 2016-09-12 (×2): qty 1

## 2016-09-12 MED ORDER — AZITHROMYCIN 500 MG IV SOLR
500.0000 mg | INTRAVENOUS | Status: DC
  Administered 2016-09-12 – 2016-09-13 (×2): 500 mg via INTRAVENOUS
  Filled 2016-09-12 (×2): qty 500

## 2016-09-12 MED ORDER — VITAMIN B-12 100 MCG PO TABS
ORAL_TABLET | Freq: Every day | ORAL | Status: DC
  Filled 2016-09-12: qty 1

## 2016-09-12 MED ORDER — PANTOPRAZOLE SODIUM 40 MG PO TBEC
40.0000 mg | DELAYED_RELEASE_TABLET | Freq: Every day | ORAL | Status: DC
  Administered 2016-09-13 – 2016-09-15 (×3): 40 mg via ORAL
  Filled 2016-09-12 (×2): qty 1

## 2016-09-12 MED ORDER — DILTIAZEM HCL ER COATED BEADS 240 MG PO CP24
240.0000 mg | ORAL_CAPSULE | Freq: Every day | ORAL | Status: DC
  Administered 2016-09-13 – 2016-09-15 (×3): 240 mg via ORAL
  Filled 2016-09-12 (×3): qty 1

## 2016-09-12 MED ORDER — SODIUM CHLORIDE 0.9 % IV SOLN
INTRAVENOUS | Status: DC
  Administered 2016-09-12: 1000 mL via INTRAVENOUS
  Administered 2016-09-13 – 2016-09-14 (×3): via INTRAVENOUS

## 2016-09-12 MED ORDER — HYDROCODONE-ACETAMINOPHEN 5-325 MG PO TABS: 1.0000 | ORAL_TABLET | ORAL | Status: DC | PRN

## 2016-09-12 MED ORDER — VANCOMYCIN HCL IN DEXTROSE 1-5 GM/200ML-% IV SOLN
1000.0000 mg | Freq: Once | INTRAVENOUS | Status: AC
  Administered 2016-09-12: 1000 mg via INTRAVENOUS
  Filled 2016-09-12: qty 200

## 2016-09-12 MED ORDER — ZOLPIDEM TARTRATE 5 MG PO TABS
5.0000 mg | ORAL_TABLET | Freq: Every evening | ORAL | Status: DC | PRN
  Filled 2016-09-12: qty 1

## 2016-09-12 MED ORDER — SODIUM CHLORIDE 0.9 % IV BOLUS (SEPSIS)
1000.0000 mL | Freq: Once | INTRAVENOUS | Status: AC
  Administered 2016-09-12: 1000 mL via INTRAVENOUS

## 2016-09-12 MED ORDER — ALBUTEROL SULFATE (2.5 MG/3ML) 0.083% IN NEBU
5.0000 mg | INHALATION_SOLUTION | Freq: Once | RESPIRATORY_TRACT | Status: AC
  Administered 2016-09-12: 5 mg via RESPIRATORY_TRACT
  Filled 2016-09-12: qty 6

## 2016-09-12 MED ORDER — SENNOSIDES-DOCUSATE SODIUM 8.6-50 MG PO TABS
1.0000 | ORAL_TABLET | Freq: Every evening | ORAL | Status: DC | PRN
  Filled 2016-09-12: qty 1

## 2016-09-12 MED ORDER — ONDANSETRON HCL 4 MG/2ML IJ SOLN: 4.0000 mg | Freq: Four times a day (QID) | INTRAMUSCULAR | Status: DC | PRN

## 2016-09-12 MED ORDER — IOPAMIDOL (ISOVUE-370) INJECTION 76%
75.0000 mL | Freq: Once | INTRAVENOUS | Status: AC | PRN
  Administered 2016-09-12: 75 mL via INTRAVENOUS

## 2016-09-12 MED ORDER — MAGNESIUM CITRATE PO SOLN
1.0000 | Freq: Once | ORAL | Status: DC | PRN
  Filled 2016-09-12: qty 296

## 2016-09-12 MED ORDER — ENOXAPARIN SODIUM 40 MG/0.4ML ~~LOC~~ SOLN
40.0000 mg | SUBCUTANEOUS | Status: DC
  Administered 2016-09-13 – 2016-09-14 (×2): 40 mg via SUBCUTANEOUS
  Filled 2016-09-12 (×2): qty 0.4

## 2016-09-12 NOTE — ED Provider Notes (Signed)
[facility] Emergency Department Provider Note ____________________________________________   I have reviewed the triage vital signs and the triage nursing note.  HISTORY  Chief Complaint Shortness of Breath   Historian Patient and spouse  HPI Levi Figueroa is a 81 y.o. male with a history of MGUS, ITP, who was recently diagnosed with pneumonia based on x-ray per the family 6 days ago when he was having fevers. He stopped having fevers 2 days ago, however he's had increasing shortness of breath and continued cough with yellow sputum production. He's had decreased by mouth intake. No chest pain. He has had generalized weakness. Symptoms are moderate in terms of shortness of breath.    Past Medical History:  Diagnosis Date  . CAD (coronary artery disease)   . Colon polyps   . GERD (gastroesophageal reflux disease)   . Gout   . Hyperlipidemia   . IDA (iron deficiency anemia)   . Kidney stones   . MI (myocardial infarction) 2004  . Mild anemia   . Thrombocytopenia [facility])     Patient Active Problem List   Diagnosis Date Noted  . MGUS (monoclonal gammopathy of unknown significance) 08/07/2016  . Idiopathic thrombocytopenic purpura ([place]) 06/29/2016    Past Surgical History:  Procedure Laterality Date  . BLADDER SURGERY    . CORONARY ARTERY BYPASS GRAFT    . INGUINAL HERNIA REPAIR Right   . LASER ABLATION     x 2 prostatic hypertrophy  . TONSILLECTOMY      Prior to Admission medications   Medication Sig Start Date End Date Taking? Authorizing Provider  acetaminophen (TYLENOL) 500 MG tablet Take 500 mg by mouth every 6 (six) hours as needed.   Yes Historical Provider, MD  atorvastatin (LIPITOR) 10 MG tablet Take 10 mg by mouth daily. 1/2 tablet   Yes Historical Provider, MD  Cyanocobalamin (VITAMIN B 12 PO) Take 1 Dose by mouth daily.   Yes Historical Provider, MD  diltiazem (DILACOR XR) 240 MG 24 hr capsule Take 240 mg by mouth daily.    Yes  Historical Provider, MD  loratadine (CLARITIN) 10 MG tablet Take 10 mg by mouth daily as needed for allergies.   Yes Historical Provider, MD  metoprolol succinate (TOPROL-XL) 25 MG 24 hr tablet Take 25 mg by mouth 2 (two) times daily.   Yes Historical Provider, MD  Multiple Vitamins-Minerals (MULTIVITAMIN WITH MINERALS) tablet Take 1 tablet by mouth daily.   Yes Historical Provider, MD  multivitamin-lutein (OCUVITE-LUTEIN) CAPS capsule Take 2 capsules by mouth daily.   Yes Historical Provider, MD  nystatin (MYCOSTATIN) 100000 UNIT/ML suspension Take 5 mLs (500,000 Units total) by mouth 4 (four) times daily. 08/24/16  Yes Cammie Sickle, MD  Omega-3 Fatty Acids (FISH OIL) 1000 MG CAPS Take 1 capsule by mouth daily.   Yes Historical Provider, MD  omeprazole (PRILOSEC) 20 MG capsule Take 20 mg by mouth daily.   Yes Historical Provider, MD  fluconazole (DIFLUCAN) 100 MG tablet Take 1 tablet (100 mg total) by mouth daily. Patient not taking: Reported on 09/12/2016 08/25/16   Cammie Sickle, MD  predniSONE (DELTASONE) 20 MG tablet Take 4 tablets (80 mg total) by mouth daily with breakfast. Patient not taking: Reported on 09/12/2016 08/08/16   Cammie Sickle, MD    Allergies  Allergen Reactions  . Penicillins Swelling    Has patient had a PCN reaction causing immediate rash, facial/tongue/throat swelling, SOB or lightheadedness with hypotension: Yes Has patient had a PCN reaction  causing severe rash involving mucus membranes or skin necrosis: No Has patient had a PCN reaction that required hospitalization No Has patient had a PCN reaction occurring within the last 10 years: No If all of the above answers are "NO", then may proceed with Cephalosporin use.     Family History  Problem Relation Age of Onset  . Breast cancer Mother   . Diabetes Father   . Heart disease Father   . Heart disease Brother   . Heart disease Brother     Social History Social History  Substance Use  Topics  . Smoking status: Never Smoker  . Smokeless tobacco: Never Used  . Alcohol use No    Review of Systems  Constitutional: Fever last week, last 2 days ago. Eyes: Negative for visual changes. ENT: Negative for sore throat. Cardiovascular: Negative for chest pain. Respiratory: Positive for shortness of breath. Gastrointestinal: Negative for abdominal pain, vomiting and diarrhea. Genitourinary: Negative for dysuria. Musculoskeletal: Negative for back pain. Skin: Negative for rash. Neurological: Negative for headache. 10 point Review of Systems otherwise negative ____________________________________________   PHYSICAL EXAM:  VITAL SIGNS: ED Triage Vitals  Enc Vitals Group     BP 09/12/16 1645 (!) 105/53     Pulse Rate 09/12/16 1645 91     Resp 09/12/16 1645 (!) 23     Temp 09/12/16 1645 98 F (36.7 C)     Temp Source 09/12/16 1645 Oral     SpO2 09/12/16 1645 92 %     Weight 09/12/16 1646 174 lb (78.9 kg)     Height 09/12/16 1646 5\' 11"  (1.803 m)     Head Circumference --      Peak Flow --      Pain Score --      Pain Loc --      Pain Edu? --      Excl. in [place]? --      Constitutional: Alert and oriented. Well appearing and in no distress. HEENT   Head: Normocephalic and atraumatic.      Eyes: Conjunctivae are normal. PERRL. Normal extraocular movements.      Ears:         Nose: No congestion/rhinnorhea.   Mouth/Throat: Mucous membranes are moist.   Neck: No stridor. Cardiovascular/Chest: Tachycardic, regular rhythm.  No murmurs, rubs, or gallops. Respiratory: Normal respiratory effort without tachypnea nor retractions. Decreased breath sounds throughout without wheezing or rhonchi. Gastrointestinal: Soft. No distention, no guarding, no rebound. Nontender.    Genitourinary/rectal:Deferred Musculoskeletal: Nontender with normal range of motion in all extremities. No joint effusions.  No lower extremity tenderness.  No edema. Neurologic:  Normal speech  and language. No gross or focal neurologic deficits are appreciated. Skin:  Skin is warm, dry and intact. No rash noted. Psychiatric: Mood and affect are normal. Speech and behavior are normal. Patient exhibits appropriate insight and judgment.   ____________________________________________  LABS (pertinent positives/negatives)  Labs Reviewed  BASIC METABOLIC PANEL - Abnormal; Notable for the following:       Result Value   Glucose, Bld 109 (*)    BUN 28 (*)    Creatinine, Ser 1.39 (*)    Calcium 8.7 (*)    GFR calc non Af Amer 47 (*)    GFR calc Af Amer 54 (*)    All other components within normal limits  CBC - Abnormal; Notable for the following:    WBC 16.2 (*)    RBC 4.37 (*)    HCT 39.2 (*)  RDW 16.5 (*)    Platelets 147 (*)    All other components within normal limits  TROPONIN I - Abnormal; Notable for the following:    Troponin I 0.03 (*)    All other components within normal limits  CULTURE, BLOOD (ROUTINE X 2)  CULTURE, BLOOD (ROUTINE X 2)  LACTIC ACID, PLASMA  LACTIC ACID, PLASMA    ____________________________________________    EKG I, Lisa Roca, MD, the attending physician have personally viewed and interpreted all ECGs.  90 bpm normal sinus rhythm. Narrow QRS normal axis. Normal ST and T-wave ____________________________________________  RADIOLOGY All Xrays were viewed by me. Imaging interpreted by Radiologist.  Chest x-ray two-view:  IMPRESSION: Bilateral perihilar bronchitic changes and interstitial prominence again noted highly suspicious for pneumonitis. Follow-up to resolution after appropriate treatment is recommended. Status post CABG. Small bilateral pleural effusion. No convincing pulmonary Edema.  CT chest angiogram for PE: Pending __________________________________________  PROCEDURES  Procedure(s) performed: None  Critical Care performed: None  ____________________________________________   ED COURSE / ASSESSMENT AND  PLAN  Pertinent labs & imaging results that were available during my care of the patient were reviewed by me and considered in my medical decision making (see chart for details).   Mr. Jobe Igo is here with worsening dyspnea and found to be hypoxic, on 2 L he is 85% when talking, and 92% on 2 L while resting. He's been on Levaquin for 6 days now, concerning for failed outpatient therapy for pneumonia. Concerning for possible early sepsis with heart rate around 105, and elevated white blood cell count and presumed pneumonia with hypoxia now.  I did place him on sepsis pathway though he is overall very well-appearing. Blood pressure 123456 systolic. He does have minimal increased BUN and creatinine I gave him a liter of fluid.  Fluid resuscitation clinically unless lactate comes back elevated.  Chest x-ray shows possible pneumonitis, but given the setting that he has been on Levaquin for a week now, I again am concerned for failed outpatient pneumonia. I am going to order a CT scan to rule out PE given the somewhat minimal findings on the chest x-ray with the significant hypoxia.  Given the concern for pneumonia, and possible sepsis, I discussed antibiotic coverage with patient, spouse, and daughter. He is listing a penicillin allergy and he describes that he thinks he had swelling of his hands 40 or 50 years ago. He is unclear whether or not he's ever had a cephalosporin. We discussed that right now he has possibly sepsis and resistant pneumonia after 1 week failed therapy on Levaquin, and discussed use of Rocephin and azithromycin and vancomycin and they feel comfortable trying this.  Discussed with Dr. Ara Kussmaul, hospitalist for admission.  Lactate and CT chest are pending at time of consultation.  CONSULTATIONS:   Hospitalist for admission.   Patient / Family / Caregiver informed of clinical course, medical decision-making process, and agree with  plan.   ___________________________________________   FINAL CLINICAL IMPRESSION(S) / ED DIAGNOSES   Final diagnoses:  Hypoxia  Sepsis, due to unspecified organism ([place])  Pneumonia, unspecified organism              Note: This dictation was prepared with Diplomatic Services operational officer dictation. Any transcriptional errors that result from this process are unintentional    Lisa Roca, MD 09/12/16 2039

## 2016-09-12 NOTE — ED Notes (Signed)
Patient transported to CT 

## 2016-09-12 NOTE — ED Triage Notes (Signed)
Pt arrived to ED from home after reporting increased SOB since being dx with pneumonia on Wednesday. Pt has been taking Levaquin since the dx but reports it has not helped and he continues to cough up yellow sputum.  Pt has been reportedly afebrile for the past 36 hours. Pt is92 % on RA. No hx of chronic O2 at home.

## 2016-09-12 NOTE — Progress Notes (Signed)
Pharmacy Antibiotic Note  Levi Figueroa is a 81 y.o. male admitted on 09/12/2016 with pneumonia.  Pharmacy has been consulted for ceftriaxone dosing.  Plan: Ceftriaxone 1 gram q 24 hours ordered for CAP.  Height: 5\' 10"  (177.8 cm) Weight: 211 lb 1.6 oz (95.8 kg) IBW/kg (Calculated) : 73  Temp (24hrs), Avg:97.9 F (36.6 C), Min:97.8 F (36.6 C), Max:98 F (36.7 C)   Recent Labs Lab 09/12/16 1655 09/12/16 2055  WBC 16.2*  --   CREATININE 1.39*  --   LATICACIDVEN  --  3.0*    Estimated Creatinine Clearance: 50.9 mL/min (by C-G formula based on SCr of 1.39 mg/dL (H)).    Allergies  Allergen Reactions  . Penicillins Swelling    Has patient had a PCN reaction causing immediate rash, facial/tongue/throat swelling, SOB or lightheadedness with hypotension: Yes Has patient had a PCN reaction causing severe rash involving mucus membranes or skin necrosis: No Has patient had a PCN reaction that required hospitalization No Has patient had a PCN reaction occurring within the last 10 years: No If all of the above answers are "NO", then may proceed with Cephalosporin use.     Antimicrobials this admission: azithromycin  >>  ceftriaxone  >>   Dose adjustments this admission:   Microbiology results: 10/17 BCx: pending 10/17 Sputum: pending      Thank you for allowing pharmacy to be a part of this patient's care.  Lazer Wollard S 09/12/2016 11:52 PM

## 2016-09-13 DIAGNOSIS — J189 Pneumonia, unspecified organism: Secondary | ICD-10-CM | POA: Diagnosis not present

## 2016-09-13 DIAGNOSIS — N179 Acute kidney failure, unspecified: Secondary | ICD-10-CM | POA: Diagnosis not present

## 2016-09-13 DIAGNOSIS — J9601 Acute respiratory failure with hypoxia: Secondary | ICD-10-CM | POA: Diagnosis not present

## 2016-09-13 LAB — LACTIC ACID, PLASMA: LACTIC ACID, VENOUS: 1.6 mmol/L (ref 0.5–1.9)

## 2016-09-13 LAB — CBC
HCT: 35.4 % — ABNORMAL LOW (ref 40.0–52.0)
HEMOGLOBIN: 12 g/dL — AB (ref 13.0–18.0)
MCH: 30.1 pg (ref 26.0–34.0)
MCHC: 33.8 g/dL (ref 32.0–36.0)
MCV: 89.1 fL (ref 80.0–100.0)
PLATELETS: 128 10*3/uL — AB (ref 150–440)
RBC: 3.98 MIL/uL — AB (ref 4.40–5.90)
RDW: 16.3 % — ABNORMAL HIGH (ref 11.5–14.5)
WBC: 13.2 10*3/uL — AB (ref 3.8–10.6)

## 2016-09-13 LAB — BLOOD GAS, ARTERIAL
Acid-base deficit: 2.3 mmol/L — ABNORMAL HIGH (ref 0.0–2.0)
Bicarbonate: 21.1 mmol/L (ref 20.0–28.0)
FIO2: 0.4
O2 Saturation: 96 %
PCO2 ART: 31 mmHg — AB (ref 32.0–48.0)
PH ART: 7.44 (ref 7.350–7.450)
PO2 ART: 79 mmHg — AB (ref 83.0–108.0)
Patient temperature: 37

## 2016-09-13 LAB — BASIC METABOLIC PANEL
ANION GAP: 7 (ref 5–15)
BUN: 21 mg/dL — ABNORMAL HIGH (ref 6–20)
CALCIUM: 7.9 mg/dL — AB (ref 8.9–10.3)
CO2: 23 mmol/L (ref 22–32)
CREATININE: 1.09 mg/dL (ref 0.61–1.24)
Chloride: 110 mmol/L (ref 101–111)
Glucose, Bld: 105 mg/dL — ABNORMAL HIGH (ref 65–99)
Potassium: 4 mmol/L (ref 3.5–5.1)
SODIUM: 140 mmol/L (ref 135–145)

## 2016-09-13 LAB — EXPECTORATED SPUTUM ASSESSMENT W REFEX TO RESP CULTURE: SPECIAL REQUESTS: NORMAL

## 2016-09-13 LAB — LIPID PANEL
CHOL/HDL RATIO: 5.7 ratio
Cholesterol: 86 mg/dL (ref 0–200)
HDL: 15 mg/dL — AB (ref >40)
LDL CALC: 37 mg/dL (ref 0–99)
Triglycerides: 169 mg/dL — ABNORMAL HIGH (ref <150)
VLDL: 34 mg/dL (ref 0–40)

## 2016-09-13 LAB — TROPONIN I
Troponin I: 0.03 ng/mL (ref <0.03)
Troponin I: <0.03 ng/mL (ref <0.03)

## 2016-09-13 LAB — MAGNESIUM: Magnesium: 1.7 mg/dL (ref 1.7–2.4)

## 2016-09-13 LAB — EXPECTORATED SPUTUM ASSESSMENT W GRAM STAIN, RFLX TO RESP C

## 2016-09-13 LAB — PHOSPHORUS: Phosphorus: 4.1 mg/dL (ref 2.5–4.6)

## 2016-09-13 MED ORDER — CEFTRIAXONE SODIUM-DEXTROSE 1-3.74 GM-% IV SOLR
1.0000 g | INTRAVENOUS | Status: DC
  Administered 2016-09-13 – 2016-09-14 (×2): 1 g via INTRAVENOUS
  Filled 2016-09-13 (×2): qty 50

## 2016-09-13 MED ORDER — DEXTROMETHORPHAN POLISTIREX ER 30 MG/5ML PO SUER
30.0000 mg | Freq: Two times a day (BID) | ORAL | Status: DC
  Administered 2016-09-13 – 2016-09-15 (×5): 30 mg via ORAL
  Filled 2016-09-13 (×6): qty 5

## 2016-09-13 MED ORDER — VITAMIN B-12 100 MCG PO TABS
100.0000 ug | ORAL_TABLET | Freq: Every day | ORAL | Status: DC
  Administered 2016-09-13 – 2016-09-15 (×3): 100 ug via ORAL
  Filled 2016-09-13 (×3): qty 1

## 2016-09-13 MED ORDER — RISAQUAD PO CAPS
2.0000 | ORAL_CAPSULE | Freq: Three times a day (TID) | ORAL | Status: DC
  Administered 2016-09-13 – 2016-09-15 (×7): 2 via ORAL
  Filled 2016-09-13 (×7): qty 2

## 2016-09-13 MED ORDER — DM-GUAIFENESIN ER 30-600 MG PO TB12: 1.0000 | ORAL_TABLET | Freq: Two times a day (BID) | ORAL | Status: DC

## 2016-09-13 MED ORDER — BENZONATATE 100 MG PO CAPS
100.0000 mg | ORAL_CAPSULE | Freq: Three times a day (TID) | ORAL | Status: DC | PRN
  Administered 2016-09-15: 09:00:00 100 mg via ORAL
  Filled 2016-09-13: qty 1

## 2016-09-13 MED ORDER — GUAIFENESIN ER 600 MG PO TB12
600.0000 mg | ORAL_TABLET | Freq: Two times a day (BID) | ORAL | Status: DC
  Administered 2016-09-13 – 2016-09-15 (×5): 600 mg via ORAL
  Filled 2016-09-13 (×5): qty 1

## 2016-09-13 MED ORDER — SODIUM CHLORIDE 0.9 % IV BOLUS (SEPSIS)
1000.0000 mL | Freq: Once | INTRAVENOUS | Status: AC
  Administered 2016-09-13: 01:00:00 1000 mL via INTRAVENOUS

## 2016-09-13 NOTE — Care Management Important Message (Signed)
Important Message  Patient Details  Name: Levi Figueroa MRN: [id number] Date of Birth: 03-15-1935   Medicare Important Message Given:  Yes    Shelbie Ammons, RN 09/13/2016, 11:18 AM

## 2016-09-13 NOTE — Progress Notes (Signed)
[facility] [place] NAME: Levi Figueroa    MRN#:  [id number]  DATE OF BIRTH:  08/31/1935  SUBJECTIVE:  Hospital Day: 1 day Levi Figueroa is a 81 y.o. male presenting with Shortness of Breath .   Overnight events: No acute overnight events Interval Events: States feels somewhat better today breathing improved but not yet back to baseline  REVIEW OF SYSTEMS:  CONSTITUTIONAL: No fever, fatigue or weakness.  EYES: No blurred or double vision.  EARS, NOSE, AND THROAT: No tinnitus or ear pain.  RESPIRATORY: No cough, Positive shortness of breath, denies wheezing or hemoptysis.  CARDIOVASCULAR: No chest pain, orthopnea, edema.  GASTROINTESTINAL: No nausea, vomiting, diarrhea or abdominal pain.  GENITOURINARY: No dysuria, hematuria.  ENDOCRINE: No polyuria, nocturia,  HEMATOLOGY: No anemia, easy bruising or bleeding SKIN: No rash or lesion. MUSCULOSKELETAL: No joint pain or arthritis.   NEUROLOGIC: No tingling, numbness, weakness.  PSYCHIATRY: No anxiety or depression.   DRUG ALLERGIES:   Allergies  Allergen Reactions  . Penicillins Swelling    Has patient had a PCN reaction causing immediate rash, facial/tongue/throat swelling, SOB or lightheadedness with hypotension: Yes Has patient had a PCN reaction causing severe rash involving mucus membranes or skin necrosis: No Has patient had a PCN reaction that required hospitalization No Has patient had a PCN reaction occurring within the last 10 years: No If all of the above answers are "NO", then may proceed with Cephalosporin use.     VITALS:  Blood pressure (!) 116/55, pulse 89, temperature 97.7 F (36.5 C), temperature source Oral, resp. rate (!) 23, height 5\' 10"  (1.778 m), weight 95.8 kg (211 lb 1.6 oz), SpO2 94 %.  PHYSICAL EXAMINATION:  VITAL SIGNS: Vitals:   09/13/16 0534 09/13/16 0750  BP:  (!) 116/55  Pulse:  89  Resp: 18 (!) 23  Temp:  97.7 F (36.5 C)    GENERAL:80 y.o.male currently in no acute distress.  HEAD: Normocephalic, atraumatic.  EYES: Pupils equal, round, reactive to light. Extraocular muscles intact. No scleral icterus.  MOUTH: Moist mucosal membrane. Dentition intact. No abscess noted.  EAR, NOSE, THROAT: Clear without exudates. No external lesions.  NECK: Supple. No thyromegaly. No nodules. No JVD.  PULMONARY: Scant rhonchi bilateral bases without wheeze No use of accessory muscles, Good respiratory effort. good air entry bilaterally CHEST: Nontender to palpation.  CARDIOVASCULAR: S1 and S2. Regular rate and rhythm. No murmurs, rubs, or gallops. No edema. Pedal pulses 2+ bilaterally.  GASTROINTESTINAL: Soft, nontender, nondistended. No masses. Positive bowel sounds. No hepatosplenomegaly.  MUSCULOSKELETAL: No swelling, clubbing, or edema. Range of motion full in all extremities.  NEUROLOGIC: Cranial nerves II through XII are intact. No gross focal neurological deficits. Sensation intact. Reflexes intact.  SKIN: No ulceration, lesions, rashes, or cyanosis. Skin warm and dry. Turgor intact.  PSYCHIATRIC: Mood, affect within normal limits. The patient is awake, alert and oriented x 3. Insight, judgment intact.      LABORATORY PANEL:   CBC  Recent Labs Lab 09/13/16 0448  WBC 13.2*  HGB 12.0*  HCT 35.4*  PLT 128*   ------------------------------------------------------------------------------------------------------------------  Chemistries   Recent Labs Lab 09/13/16 0006 09/13/16 0448  NA  --  140  K  --  4.0  CL  --  110  CO2  --  23  GLUCOSE  --  105*  BUN  --  21*  CREATININE  --  1.09  CALCIUM  --  7.9*  MG 1.7  --    ------------------------------------------------------------------------------------------------------------------  Cardiac Enzymes  Recent Labs Lab 09/13/16 0448  TROPONINI 0.03*    ------------------------------------------------------------------------------------------------------------------  RADIOLOGY:  Dg Chest 2 View  Result Date: 09/12/2016 CLINICAL DATA:  Shortness of breath, pneumonia EXAM: CHEST  2 VIEW COMPARISON:  09/05/2016 FINDINGS: Cardiomediastinal silhouette is stable. Bilateral perihilar bronchitic changes and interstitial prominence again noted highly suspicious for pneumonitis. Follow-up to resolution after appropriate treatment is recommended. Status post CABG. Small bilateral pleural effusion. No convincing pulmonary edema. IMPRESSION: Bilateral perihilar bronchitic changes and interstitial prominence again noted highly suspicious for pneumonitis. Follow-up to resolution after appropriate treatment is recommended. Status post CABG. Small bilateral pleural effusion. No convincing pulmonary edema. Electronically Signed   By: Lahoma Crocker M.D.   On: 09/12/2016 17:18   Ct Angio Chest Pe W/cm &/or Wo Cm  Result Date: 09/12/2016 CLINICAL DATA:  Acute onset of shortness of breath and productive cough. Decreased O2 saturation. Initial encounter. EXAM: CT ANGIOGRAPHY CHEST WITH CONTRAST TECHNIQUE: Multidetector CT imaging of the chest was performed using the standard protocol during bolus administration of intravenous contrast. Multiplanar CT image reconstructions and MIPs were obtained to evaluate the vascular anatomy. CONTRAST:  75 mL of Isovue 370 IV contrast COMPARISON:  Chest radiograph performed earlier today at 5:04 p.m. FINDINGS: Cardiovascular: There is no evidence of significant pulmonary embolus. Evaluation for pulmonary embolus is suboptimal due to motion artifact. The heart is borderline normal in size. Diffuse coronary artery calcifications are seen. The patient is status post median sternotomy. The thoracic aorta is unremarkable. The great vessels are grossly unremarkable in appearance. Mediastinum/Nodes: Scattered prominent azygoesophageal recess,  paraesophageal, right paratracheal and periaortic nodes are seen, measuring up to 1.7 cm in short axis. This may reflect the patient's underlying pneumonia. However, since the largest nodes are seen directly adjacent to the mid esophagus, and there may be mild associated esophageal wall thickening, endoscopy is recommended for further evaluation. No pericardial effusion is identified. The thyroid gland is unremarkable. No axillary lymphadenopathy is seen. Lungs/Pleura: Trace bilateral pleural fluid is noted. Diffuse interstitial prominence and hazy bilateral airspace opacities are seen, most prominent at the upper lung lobes. This may reflect a combination of pneumonia and pulmonary edema, given its appearance. No pneumothorax is seen. There is mild nodularity with respect to some of the opacities, but no dominant mass is identified. Upper Abdomen: The visualized portions of the liver are unremarkable. The spleen is mildly bulky but borderline normal in size. The visualized portions of the gallbladder, pancreas and adrenal glands are within normal limits. Musculoskeletal: No acute osseous abnormalities are identified. The visualized musculature is unremarkable in appearance. Review of the MIP images confirms the above findings. IMPRESSION: 1. No evidence of significant pulmonary embolus. 2. Diffuse interstitial prominence and hazy bilateral airspace opacities, most prominent at the upper lung lobes. This may reflect a combination of pneumonia and pulmonary edema, given its appearance. Trace bilateral pleural fluid noted. 3. Enlarged mediastinal nodes noted, measuring up to 1.7 cm in short axis. This may reflect the underlying pneumonia. However, since the largest nodes are seen directly adjacent to the mid esophagus, and there may be mild associated esophageal wall thickening, endoscopy is recommended for further evaluation, to exclude underlying malignancy. 4. Diffuse coronary artery calcifications seen.  Electronically Signed   By: Garald Balding M.D.   On: 09/12/2016 21:44    EKG:   Orders placed or performed during the hospital encounter of 09/12/16  . ED EKG  . ED EKG    ASSESSMENT AND PLAN:   Levi Lawman  Figueroa is a 81 y.o. male presenting with Shortness of Breath . Admitted 09/12/2016 : Day #: 1 day 1. Sepsis secondary to community-acquired pneumonia: Continue current antibiotics day #1/5, continue oxygen, breathing treatments 2. Acute kidney injury: Resolved 3. ITP: Prednisone 3. GERD without esophagitis PPI therapy  All the records are reviewed and case discussed with Care Management/Social Workerr. Management plans discussed with the patient, family and they are in agreement.  CODE STATUS: full TOTAL TIME TAKING CARE OF THIS PATIENT: 28 minutes.   POSSIBLE D/C IN 1-2DAYS, DEPENDING ON CLINICAL CONDITION.   Fabiha Rougeau,  Karenann Cai.D on 09/13/2016 at 1:11 PM  Between 7am to 6pm - Pager - [phone]  After 6pm: House Pager: - [facility]  Office  [phone]  CC: Primary care physician; [name], MD

## 2016-09-13 NOTE — Care Management (Signed)
Admitted to this facility with the diagnosis of sepsis/pneumonia. Lives with wife, Mardene Celeste [phone]). Last seen Dr. Ramonita Lab on Wednesday of last week. No home health. No skilled facility. No home oxygen. Uses no aids for ambulation. Takes care of all basic and instrumental activities of daily living himself, drives. Works in is [employer] outside his home. No falls. Appetite is better now. Family will transport. Shelbie Ammons RN MSN CCM Care Management [phone]

## 2016-09-13 NOTE — Progress Notes (Signed)
Patient noted increasingly SOB when walking to the bathroom. Sats dropped to 89% with 2L [place] and RR 26. Patient was assisted back to bed and, with some deep breathing exercises, breathing became less labored. Several hours later, patient note to with SOB while at rest in the bed sats 89% and RR 32. Oxygen increased to 4L HOB elevated to 90 degrees, MD made aware. New orders rec'd for STAT ABG which showed pH 7.44, pCO2 31, pO2 79, and Hc03 21.1. MD ordered to maintain oxygen at 4L [place] and continue to monitor. Patient encouraged to use urinal or bedside commode to prevent over excertion.

## 2016-09-13 NOTE — Progress Notes (Signed)
CRITICAL VALUE ALERT  Critical value received:  Lactic Acid 3.9  Date of notification:  09/12/2016  Time of notification:  2359  Critical value read back:Yes.    Nurse who received alert:  Nani Ravens RN  MD notified (1st page):  Dr. Ara Kussmaul   Time of first page:  2359   MD notified (2nd page): Dr. Ara Kussmaul  Time of second page:  Responding MD:    Time MD responded:

## 2016-09-13 NOTE — H&P (Signed)
[place] @ [facility] Admission History and Physical Harvie Bridge, D.O.  ---------------------------------------------------------------------------------------------------------------------   PATIENT NAMEJeriko Figueroa MR#: [id number] DATE OF BIRTH: 10-09-1935 DATE OF ADMISSION: 09/12/2016 PRIMARY CARE PHYSICIAN: [name], MD  REQUESTING/REFERRING PHYSICIAN: ED Dr. Reita Cliche  CHIEF COMPLAINT: Chief Complaint  Patient presents with  . Shortness of Breath    HISTORY OF PRESENT ILLNESS: Levi Figueroa is a 81 y.o. male with a known history of MGUS, ITP, GERD, MI  presents to the emergency department For evaluation of pneumonia. Patient was reportedly diagnosed with pneumonia and placed on a 10 day course of Levaquin of which she completed 5 days. Since he started taking Levaquin he has had increasing shortness of breath, cough productive of yellow sputum.  He reports fatigue and weakness but no fevers or chills.  Of note patient has been immunocompromised secondary to steroid therapy for ITP chronically.  Otherwise there has been no change in status. Patient has been taking medication as prescribed and there has been no recent change in medication or diet.  There has been no recent illness, travel or sick contacts.    Patient denies fevers/chills, weakness, dizziness, chest pain, shortness of breath, N/V/C/D, abdominal pain, dysuria/frequency, changes in mental status.   EMS/ED COURSE:   Patient received Rocephin, Zithromax.  PAST MEDICAL HISTORY: Past Medical History:  Diagnosis Date  . CAD (coronary artery disease)   . Cancer ([place])   . Colon polyps   . GERD (gastroesophageal reflux disease)   . Gout   . Hyperlipidemia   . IDA (iron deficiency anemia)   . Kidney stones   . MI (myocardial infarction) 2004  . Mild anemia   . Thrombocytopenia ([place])       PAST SURGICAL HISTORY: Past Surgical History:  Procedure Laterality Date  . BLADDER SURGERY     . CORONARY ARTERY BYPASS GRAFT    . INGUINAL HERNIA REPAIR Right   . LASER ABLATION     x 2 prostatic hypertrophy  . TONSILLECTOMY        SOCIAL HISTORY: Social History  Substance Use Topics  . Smoking status: Never Smoker  . Smokeless tobacco: Never Used  . Alcohol use No      FAMILY HISTORY: Family History  Problem Relation Age of Onset  . Breast cancer Mother   . Diabetes Father   . Heart disease Father   . Heart disease Brother   . Heart disease Brother      MEDICATIONS AT HOME: Prior to Admission medications   Medication Sig Start Date End Date Taking? Authorizing Provider  acetaminophen (TYLENOL) 500 MG tablet Take 500 mg by mouth every 6 (six) hours as needed.   Yes Historical Provider, MD  atorvastatin (LIPITOR) 10 MG tablet Take 10 mg by mouth daily. 1/2 tablet   Yes Historical Provider, MD  Cyanocobalamin (VITAMIN B 12 PO) Take 1 Dose by mouth daily.   Yes Historical Provider, MD  diltiazem (DILACOR XR) 240 MG 24 hr capsule Take 240 mg by mouth daily.    Yes Historical Provider, MD  loratadine (CLARITIN) 10 MG tablet Take 10 mg by mouth daily as needed for allergies.   Yes Historical Provider, MD  metoprolol succinate (TOPROL-XL) 25 MG 24 hr tablet Take 25 mg by mouth 2 (two) times daily.   Yes Historical Provider, MD  Multiple Vitamins-Minerals (MULTIVITAMIN WITH MINERALS) tablet Take 1 tablet by mouth daily.   Yes Historical Provider, MD  multivitamin-lutein (OCUVITE-LUTEIN) CAPS capsule Take 2  capsules by mouth daily.   Yes Historical Provider, MD  nystatin (MYCOSTATIN) 100000 UNIT/ML suspension Take 5 mLs (500,000 Units total) by mouth 4 (four) times daily. 08/24/16  Yes Cammie Sickle, MD  Omega-3 Fatty Acids (FISH OIL) 1000 MG CAPS Take 1 capsule by mouth daily.   Yes Historical Provider, MD  omeprazole (PRILOSEC) 20 MG capsule Take 20 mg by mouth daily.   Yes Historical Provider, MD  fluconazole (DIFLUCAN) 100 MG tablet Take 1 tablet (100 mg total) by  mouth daily. Patient not taking: Reported on 09/12/2016 08/25/16   Cammie Sickle, MD  predniSONE (DELTASONE) 20 MG tablet Take 4 tablets (80 mg total) by mouth daily with breakfast. Patient not taking: Reported on 09/12/2016 08/08/16   Cammie Sickle, MD      DRUG ALLERGIES: Allergies  Allergen Reactions  . Penicillins Swelling    Has patient had a PCN reaction causing immediate rash, facial/tongue/throat swelling, SOB or lightheadedness with hypotension: Yes Has patient had a PCN reaction causing severe rash involving mucus membranes or skin necrosis: No Has patient had a PCN reaction that required hospitalization No Has patient had a PCN reaction occurring within the last 10 years: No If all of the above answers are "NO", then may proceed with Cephalosporin use.      REVIEW OF SYSTEMS: CONSTITUTIONAL: Positive fatigue, weakness, negative fever, chills, weight gain/loss, headache EYES: No blurry or double vision. ENT: No tinnitus, postnasal drip, redness or soreness of the oropharynx. RESPIRATORY: Positive dyspnea, cough, negative wheeze, hemoptysis. CARDIOVASCULAR: No chest pain, orthopnea, palpitations, syncope. GASTROINTESTINAL: No nausea, vomiting, constipation, diarrhea, abdominal pain. No hematemesis, melena or hematochezia. GENITOURINARY: No dysuria, frequency, hematuria. ENDOCRINE: No polyuria or nocturia. No heat or cold intolerance. HEMATOLOGY: No anemia, bruising, bleeding. INTEGUMENTARY: No rashes, ulcers, lesions. MUSCULOSKELETAL: No pain, arthritis, swelling, gout. NEUROLOGIC: No numbness, tingling, weakness or ataxia. No seizure-type activity. PSYCHIATRIC: No anxiety, depression, insomnia.  PHYSICAL EXAMINATION: VITAL SIGNS: Blood pressure (!) 159/52, pulse 93, temperature 97.8 F (36.6 C), temperature source Oral, resp. rate 18, height 5\' 10"  (1.778 m), weight 95.8 kg (211 lb 1.6 oz), SpO2 97 %.  GENERAL: 81 y.o.-year-old white male patient,  well-developed, well-nourished lying in the bed in no acute distress.  Pleasant and cooperative.   HEENT: Head atraumatic, normocephalic. Pupils equal, round, reactive to light and accommodation. No scleral icterus. Extraocular muscles intact. Oropharynx is clear. Mucus membranes moist. NECK: Supple, full range of motion. No JVD, no bruit heard. No cervical lymphadenopathy. CHEST: Normal breath sounds bilaterally. No wheezing, rales, rhonchi or crackles. No use of accessory muscles of respiration.  No reproducible chest wall tenderness.  CARDIOVASCULAR: S1, S2 normal. No murmurs, rubs, or gallops appreciated. Cap refill <2 seconds. ABDOMEN: Soft, nontender, nondistended. No rebound, guarding, rigidity. Normoactive bowel sounds present in all four quadrants. No organomegaly or mass. EXTREMITIES: Full range of motion. Mild bilateral lower stomach edema to ankles. NEUROLOGIC: Cranial nerves II through XII are grossly intact with no focal sensorimotor deficit. Muscle strength 5/5 in all extremities. Sensation intact. Gait not checked. PSYCHIATRIC: The patient is alert and oriented x 3. Normal affect, mood, thought content. SKIN: Warm, dry, and intact without obvious rash, lesion, or ulcer.  LABORATORY PANEL:  CBC  Recent Labs Lab 09/12/16 1655  WBC 16.2*  HGB 13.1  HCT 39.2*  PLT 147*   ----------------------------------------------------------------------------------------------------------------- Chemistries  Recent Labs Lab 09/12/16 1655 09/13/16 0006  NA 139  --   K 4.4  --   CL 105  --  CO2 26  --   GLUCOSE 109*  --   BUN 28*  --   CREATININE 1.39*  --   CALCIUM 8.7*  --   MG  --  1.7   ------------------------------------------------------------------------------------------------------------------ Cardiac Enzymes  Recent Labs Lab 09/13/16 0006  TROPONINI <0.03    ------------------------------------------------------------------------------------------------------------------  RADIOLOGY: Dg Chest 2 View  Result Date: 09/12/2016 CLINICAL DATA:  Shortness of breath, pneumonia EXAM: CHEST  2 VIEW COMPARISON:  09/05/2016 FINDINGS: Cardiomediastinal silhouette is stable. Bilateral perihilar bronchitic changes and interstitial prominence again noted highly suspicious for pneumonitis. Follow-up to resolution after appropriate treatment is recommended. Status post CABG. Small bilateral pleural effusion. No convincing pulmonary edema. IMPRESSION: Bilateral perihilar bronchitic changes and interstitial prominence again noted highly suspicious for pneumonitis. Follow-up to resolution after appropriate treatment is recommended. Status post CABG. Small bilateral pleural effusion. No convincing pulmonary edema. Electronically Signed   By: Lahoma Crocker M.D.   On: 09/12/2016 17:18   Ct Angio Chest Pe W/cm &/or Wo Cm  Result Date: 09/12/2016 CLINICAL DATA:  Acute onset of shortness of breath and productive cough. Decreased O2 saturation. Initial encounter. EXAM: CT ANGIOGRAPHY CHEST WITH CONTRAST TECHNIQUE: Multidetector CT imaging of the chest was performed using the standard protocol during bolus administration of intravenous contrast. Multiplanar CT image reconstructions and MIPs were obtained to evaluate the vascular anatomy. CONTRAST:  75 mL of Isovue 370 IV contrast COMPARISON:  Chest radiograph performed earlier today at 5:04 p.m. FINDINGS: Cardiovascular: There is no evidence of significant pulmonary embolus. Evaluation for pulmonary embolus is suboptimal due to motion artifact. The heart is borderline normal in size. Diffuse coronary artery calcifications are seen. The patient is status post median sternotomy. The thoracic aorta is unremarkable. The great vessels are grossly unremarkable in appearance. Mediastinum/Nodes: Scattered prominent azygoesophageal recess,  paraesophageal, right paratracheal and periaortic nodes are seen, measuring up to 1.7 cm in short axis. This may reflect the patient's underlying pneumonia. However, since the largest nodes are seen directly adjacent to the mid esophagus, and there may be mild associated esophageal wall thickening, endoscopy is recommended for further evaluation. No pericardial effusion is identified. The thyroid gland is unremarkable. No axillary lymphadenopathy is seen. Lungs/Pleura: Trace bilateral pleural fluid is noted. Diffuse interstitial prominence and hazy bilateral airspace opacities are seen, most prominent at the upper lung lobes. This may reflect a combination of pneumonia and pulmonary edema, given its appearance. No pneumothorax is seen. There is mild nodularity with respect to some of the opacities, but no dominant mass is identified. Upper Abdomen: The visualized portions of the liver are unremarkable. The spleen is mildly bulky but borderline normal in size. The visualized portions of the gallbladder, pancreas and adrenal glands are within normal limits. Musculoskeletal: No acute osseous abnormalities are identified. The visualized musculature is unremarkable in appearance. Review of the MIP images confirms the above findings. IMPRESSION: 1. No evidence of significant pulmonary embolus. 2. Diffuse interstitial prominence and hazy bilateral airspace opacities, most prominent at the upper lung lobes. This may reflect a combination of pneumonia and pulmonary edema, given its appearance. Trace bilateral pleural fluid noted. 3. Enlarged mediastinal nodes noted, measuring up to 1.7 cm in short axis. This may reflect the underlying pneumonia. However, since the largest nodes are seen directly adjacent to the mid esophagus, and there may be mild associated esophageal wall thickening, endoscopy is recommended for further evaluation, to exclude underlying malignancy. 4. Diffuse coronary artery calcifications seen.  Electronically Signed   By: Francoise Schaumann.D.  On: 09/12/2016 21:44    EKG: Normal sinus rhythm at 90 bpm with normal axis and nonspecific ST-T wave changes.   IMPRESSION AND PLAN:  This is a 81 y.o. male with a history of MGUS, ITP, GERD, MI  now being admitted with: 1. Sepsis secondary to community-acquired pneumonia, failed outpatient therapy-admit for IV antibiotics with Rocephin and Zithromax, follow blood and sputum cultures, IV fluid hydration, Mucinex, cough suppressants, antipyretics and pain control. 2. AK I-IV fluid hydration and repeat BMP in a.m. 3. Elevated troponin, mild likely secondary to demand ischemia-we'll monitor on telemetry and trend troponins.  4. History of coronary artery disease-continue Lipitor, diltiazem, metoprolol 5. History of ITP-continue prednisone 6. History of oral thrush-continue Diflucan, nystatin suspension 7. History of seasonal allergies-continue Claritin 8. History of GERD-continue Prilosec   Diet/Nutrition: Heart healthy Fluids: IV normal saline DVT Px: Lovenox, SCDs and early ambulation Code Status: Full  All the records are reviewed and case discussed with ED provider. Management plans discussed with the patient and/or family who express understanding and agree with plan of care.   TOTAL TIME TAKING CARE OF THIS PATIENT: 60 minutes.   Savaughn Karwowski D.O. on 09/13/2016 at 2:10 AM Between 7am to 6pm - Pager - [phone] After 6pm go to [email or web address] - [employer] [facility] [place] [facility] Office [phone] CC: Primary care physician; [name], MD     Note: This dictation was prepared with Dragon dictation along with smaller phrase technology. Any transcriptional errors that result from this process are unintentional.

## 2016-09-14 ENCOUNTER — Inpatient Hospital Stay: Payer: PPO | Admitting: Internal Medicine

## 2016-09-14 ENCOUNTER — Inpatient Hospital Stay: Payer: PPO

## 2016-09-14 DIAGNOSIS — J9601 Acute respiratory failure with hypoxia: Secondary | ICD-10-CM | POA: Diagnosis not present

## 2016-09-14 DIAGNOSIS — J189 Pneumonia, unspecified organism: Secondary | ICD-10-CM | POA: Diagnosis not present

## 2016-09-14 DIAGNOSIS — N179 Acute kidney failure, unspecified: Secondary | ICD-10-CM | POA: Diagnosis not present

## 2016-09-14 MED ORDER — AZITHROMYCIN 250 MG PO TABS
250.0000 mg | ORAL_TABLET | Freq: Every day | ORAL | Status: DC
  Administered 2016-09-14: 17:00:00 250 mg via ORAL
  Filled 2016-09-14: qty 1

## 2016-09-14 MED ORDER — PREDNISONE 20 MG PO TABS
40.0000 mg | ORAL_TABLET | Freq: Every day | ORAL | Status: DC
  Administered 2016-09-15: 40 mg via ORAL
  Filled 2016-09-14: qty 2

## 2016-09-14 MED ORDER — FUROSEMIDE 40 MG PO TABS
40.0000 mg | ORAL_TABLET | Freq: Once | ORAL | Status: AC
  Administered 2016-09-14: 40 mg via ORAL
  Filled 2016-09-14: qty 1

## 2016-09-14 NOTE — Evaluation (Signed)
Physical Therapy Evaluation Patient Details Name: Levi Figueroa MRN: [id number] DOB: 08-31-1935 Today's Date: 09/14/2016   History of Present Illness  pt. is a 81 y.o. male who was admitted to [facility] with dx. of sepsis secondary to pneumonia and AKI.   Clinical Impression  Pt. Using restroom upon arrival, motivated and eager to participate in activity. Pt. Demonstrates 5/5 strength grossly.  Pt. On room air when using bathroom upon arrival spO2 86%, 2L [place] reapplied at rest spO2 increased to 99%, utilized 2L O2  for activity during session and following session completion. Pt. Able to perform sit <>stand transfers with and without use of RW with supervision, demonstrating safe technique with all transfers. Pt. Performed approx. 88ft.min A Ambulation without AD (baseline) demonstrating decreased cadence, increased lateral sway, and mild-mod deviations with multidirectional head turns. Pt. Ambulated an additional 314ft. With RW CGA, demonstrating improved cadence and decreased sway, able to perform mutli-directional head turns without deviation or LOB. Pt. Would benefit from use of RW for all mobility at this time. Would benefit from skilled PT to address above deficits and promote optimal return to PLOF Recommend outpatient PT to follow up with further skilled PT needs.     Follow Up Recommendations Outpatient PT    Equipment Recommendations  Rolling walker with 5" wheels    Recommendations for Other Services       Precautions / Restrictions Precautions Precautions: Fall Restrictions Weight Bearing Restrictions: No      Mobility  Bed Mobility Overal bed mobility:  (Pt. in bathroom upon arrival, returned to chair at end of session.)                Transfers Overall transfer level: Needs assistance Equipment used: Rolling walker (2 wheeled) Transfers: Sit to/from Stand Sit to Stand: Supervision         General transfer comment: Pt. demonstrates safe transfer technique,  good balance when transitioning UE's from seating surface to RW  Ambulation/Gait Ambulation/Gait assistance: Min assist Ambulation Distance (Feet): 40 Feet         General Gait Details: Pt. demonstrates slowed cadence and increased lateral sway, wide BOS, short step length B. demonstrates mild-mod deviation with mutidirectional head turns  [employer] (Stroke Patients Only)       Balance Overall balance assessment: Needs assistance Sitting-balance support: Feet supported Sitting balance-Leahy Scale: Good     Standing balance support: Bilateral upper extremity supported Standing balance-Leahy Scale: Good Standing balance comment: Pt. able to perform static standing without UE support                             Pertinent Vitals/Pain Pain Assessment: No/denies pain    Home Living Family/patient expects to be discharged to:: Private residence Living Arrangements: Spouse/significant other Available Help at Discharge: Family Type of Home: House Home Access: Stairs to enter Entrance Stairs-Rails: Left Entrance Stairs-Number of Steps: 1 Home Layout: One level Home Equipment: None      Prior Function Level of Independence: Independent         Comments: Pt. reports previous independence with all ADL's, gait, and enjoys performing yard work and woodworking at baseline     [employer]        Extremity/Trunk Assessment   Upper Extremity Assessment: Overall WFL for tasks assessed (Pt. demonstrates grossly 5/5 strength symmetrical)  Lower Extremity Assessment: Overall WFL for tasks assessed (Pt. demonstrates 5/5 strength symmetrical)         Communication   Communication: No difficulties  Cognition Arousal/Alertness: Awake/alert Behavior During Therapy: WFL for tasks assessed/performed Overall Cognitive Status: Within Functional Limits for tasks assessed                       General Comments      Exercises Other Exercises Other Exercises: Pt. able to ambulate approx. 350 ft. CGA with use of RW demonstrating increased cadence and step length B, pt. able to complete multidirectional head turns without LOB. Pt. in bathroom upon arrival, demonstrates abiltiy to perform functional reaching in static standing with supervision A,. Pt. able to stand feet together without UE support 2 secs. longest attempt.    Assessment/Plan    PT Assessment Patient needs continued PT services  PT Problem List Decreased activity tolerance;Decreased mobility;Decreased balance;Decreased knowledge of use of DME;Decreased coordination          PT Treatment Interventions DME instruction;Gait training;Stair training;Therapeutic activities;Therapeutic exercise;Neuromuscular re-education;Functional mobility training;Balance training;Patient/family education    PT Goals (Current goals can be found in the Care Plan section)  Acute Rehab PT Goals Patient Stated Goal: pt. would like to return to woodworking PT Goal Formulation: With patient Time For Goal Achievement: 09/28/16 Potential to Achieve Goals: Good    Frequency Min 2X/week   Barriers to discharge        Co-evaluation               End of Session Equipment Utilized During Treatment: Gait belt;Oxygen Activity Tolerance: Patient tolerated treatment well Patient left: in chair;with family/visitor present;with chair alarm set (wife present)           Time: 1350-1415 PT Time Calculation (min) (ACUTE ONLY): 25 min   Charges:         PT G Codes:         Melanie Crazier, SPT  09/14/16,4:36 PM

## 2016-09-14 NOTE — Progress Notes (Signed)
Removed nasal canula. Pt's O2 92%. Ambulated 50 ft, O2 dropped to 87%. Pt showed no other signs of distress and reported feeling well. Replaced nasal canula at 2L, O2 at 98%. Pt is sitting in chair and reports no distress, discomfort.

## 2016-09-14 NOTE — Progress Notes (Signed)
[facility] at [place] NAME: Levi Figueroa    MRN#:  [id number]  DATE OF BIRTH:  [birth year]  SUBJECTIVE:  Hospital Day: 2 days Levi Figueroa is a 81 y.o. male presenting with Shortness of Breath .   Improving with breathing slowly On 2 L oxygen. Coughing with no sputum. No chest pain. Feels weak.  REVIEW OF SYSTEMS:  CONSTITUTIONAL: No fever, fatigue or weakness.  EYES: No blurred or double vision.  EARS, NOSE, AND THROAT: No tinnitus or ear pain.  RESPIRATORY:  Positive shortness of breath, denies wheezing or hemoptysis. Dry cough CARDIOVASCULAR: No chest pain, orthopnea, edema.  GASTROINTESTINAL: No nausea, vomiting, diarrhea or abdominal pain.  GENITOURINARY: No dysuria, hematuria.  ENDOCRINE: No polyuria, nocturia,  HEMATOLOGY: No anemia, easy bruising or bleeding SKIN: No rash or lesion. MUSCULOSKELETAL: No joint pain or arthritis.   NEUROLOGIC: No tingling, numbness, weakness.  PSYCHIATRY: No anxiety or depression.   DRUG ALLERGIES:   Allergies  Allergen Reactions  . Penicillins Swelling    Has patient had a PCN reaction causing immediate rash, facial/tongue/throat swelling, SOB or lightheadedness with hypotension: Yes Has patient had a PCN reaction causing severe rash involving mucus membranes or skin necrosis: No Has patient had a PCN reaction that required hospitalization No Has patient had a PCN reaction occurring within the last 10 years: No If all of the above answers are "NO", then may proceed with Cephalosporin use.     VITALS:  Blood pressure 125/66, pulse 85, temperature 97.5 F (36.4 C), temperature source Oral, resp. rate 18, height 5\' 10"  (1.778 m), weight 95.8 kg (211 lb 1.6 oz), SpO2 98 %.  PHYSICAL EXAMINATION:  VITAL SIGNS: Vitals:   09/14/16 0945 09/14/16 0955  BP:    Pulse: 87 85  Resp:    Temp:     GENERAL:80 y.o.male currently in no acute distress.  HEAD: Normocephalic, atraumatic.   EYES: Pupils equal, round, reactive to light. Extraocular muscles intact. No scleral icterus.  MOUTH: Moist mucosal membrane. Dentition intact. No abscess noted.  EAR, NOSE, THROAT: Clear without exudates. No external lesions.  NECK: Supple. No thyromegaly. No nodules. No JVD.  PULMONARY: Bilateral basal rhonchi and crackles. Increase work of breathing. CARDIOVASCULAR: S1 and S2. Regular rate and rhythm. No murmurs, rubs, or gallops. No edema. Pedal pulses 2+ bilaterally.  GASTROINTESTINAL: Soft, nontender, nondistended. No masses. Positive bowel sounds. No hepatosplenomegaly.  MUSCULOSKELETAL: No swelling, clubbing, or edema. Range of motion full in all extremities.  NEUROLOGIC: Cranial nerves II through XII are intact. No gross focal neurological deficits.  SKIN: No ulceration, lesions, rashes, or cyanosis. Skin warm and dry. Turgor intact.  PSYCHIATRIC: Mood, affect within normal limits. The patient is awake, alert and oriented x 3. Insight, judgment intact.   LABORATORY PANEL:   CBC  Recent Labs Lab 09/13/16 0448  WBC 13.2*  HGB 12.0*  HCT 35.4*  PLT 128*   ------------------------------------------------------------------------------------------------------------------  Chemistries   Recent Labs Lab 09/13/16 0006 09/13/16 0448  NA  --  140  K  --  4.0  CL  --  110  CO2  --  23  GLUCOSE  --  105*  BUN  --  21*  CREATININE  --  1.09  CALCIUM  --  7.9*  MG 1.7  --    ------------------------------------------------------------------------------------------------------------------  Cardiac Enzymes  Recent Labs Lab 09/13/16 0448  TROPONINI 0.03*   ------------------------------------------------------------------------------------------------------------------  RADIOLOGY:  Dg Chest 2 View  Result Date: 09/12/2016 CLINICAL  DATA:  Shortness of breath, pneumonia EXAM: CHEST  2 VIEW COMPARISON:  09/05/2016 FINDINGS: Cardiomediastinal silhouette is stable.  Bilateral perihilar bronchitic changes and interstitial prominence again noted highly suspicious for pneumonitis. Follow-up to resolution after appropriate treatment is recommended. Status post CABG. Small bilateral pleural effusion. No convincing pulmonary edema. IMPRESSION: Bilateral perihilar bronchitic changes and interstitial prominence again noted highly suspicious for pneumonitis. Follow-up to resolution after appropriate treatment is recommended. Status post CABG. Small bilateral pleural effusion. No convincing pulmonary edema. Electronically Signed   By: Lahoma Crocker M.D.   On: 09/12/2016 17:18   Ct Angio Chest Pe W/cm &/or Wo Cm  Result Date: 09/12/2016 CLINICAL DATA:  Acute onset of shortness of breath and productive cough. Decreased O2 saturation. Initial encounter. EXAM: CT ANGIOGRAPHY CHEST WITH CONTRAST TECHNIQUE: Multidetector CT imaging of the chest was performed using the standard protocol during bolus administration of intravenous contrast. Multiplanar CT image reconstructions and MIPs were obtained to evaluate the vascular anatomy. CONTRAST:  75 mL of Isovue 370 IV contrast COMPARISON:  Chest radiograph performed earlier today at 5:04 p.m. FINDINGS: Cardiovascular: There is no evidence of significant pulmonary embolus. Evaluation for pulmonary embolus is suboptimal due to motion artifact. The heart is borderline normal in size. Diffuse coronary artery calcifications are seen. The patient is status post median sternotomy. The thoracic aorta is unremarkable. The great vessels are grossly unremarkable in appearance. Mediastinum/Nodes: Scattered prominent azygoesophageal recess, paraesophageal, right paratracheal and periaortic nodes are seen, measuring up to 1.7 cm in short axis. This may reflect the patient's underlying pneumonia. However, since the largest nodes are seen directly adjacent to the mid esophagus, and there may be mild associated esophageal wall thickening, endoscopy is recommended  for further evaluation. No pericardial effusion is identified. The thyroid gland is unremarkable. No axillary lymphadenopathy is seen. Lungs/Pleura: Trace bilateral pleural fluid is noted. Diffuse interstitial prominence and hazy bilateral airspace opacities are seen, most prominent at the upper lung lobes. This may reflect a combination of pneumonia and pulmonary edema, given its appearance. No pneumothorax is seen. There is mild nodularity with respect to some of the opacities, but no dominant mass is identified. Upper Abdomen: The visualized portions of the liver are unremarkable. The spleen is mildly bulky but borderline normal in size. The visualized portions of the gallbladder, pancreas and adrenal glands are within normal limits. Musculoskeletal: No acute osseous abnormalities are identified. The visualized musculature is unremarkable in appearance. Review of the MIP images confirms the above findings. IMPRESSION: 1. No evidence of significant pulmonary embolus. 2. Diffuse interstitial prominence and hazy bilateral airspace opacities, most prominent at the upper lung lobes. This may reflect a combination of pneumonia and pulmonary edema, given its appearance. Trace bilateral pleural fluid noted. 3. Enlarged mediastinal nodes noted, measuring up to 1.7 cm in short axis. This may reflect the underlying pneumonia. However, since the largest nodes are seen directly adjacent to the mid esophagus, and there may be mild associated esophageal wall thickening, endoscopy is recommended for further evaluation, to exclude underlying malignancy. 4. Diffuse coronary artery calcifications seen. Electronically Signed   By: Garald Balding M.D.   On: 09/12/2016 21:44    EKG:   Orders placed or performed during the hospital encounter of 09/12/16  . ED EKG  . ED EKG    ASSESSMENT AND PLAN:   Levi Figueroa is a 81 y.o. male presenting with Shortness of Breath . Admitted 09/12/2016 : Day #: 2 days   1. Sepsis  secondary to community-acquired  pneumonia: Continue current antibiotics day #1/5, continue oxygen, breathing treatments Cultures  Acute hypoxic respiratory failure due to pneumonia improving  2. Acute kidney injury  Resolved  3. ITP: Prednisone  3. GERD without esophagitis PPI therapy  All the records are reviewed and case discussed with Care Management/Social Workerr. Management plans discussed with the patient, family and they are in agreement.  CODE STATUS: full TOTAL TIME TAKING CARE OF THIS PATIENT: 35 minutes.   POSSIBLE D/C IN 1-2DAYS, DEPENDING ON CLINICAL CONDITION.   Hillary Bow R M.D on 09/14/2016 at 11:51 AM  Between 7am to 6pm - Pager - [phone]  After 6pm: House Pager: - [facility]  Office  [phone]  CC: Primary care physician; [name], MD

## 2016-09-14 NOTE — Progress Notes (Signed)
While rounding [place] made initial visit to room 110. Pt was sitting in recliner and wife was at bedside. The Pt was in good spirits and we had a pleasant conversation. No prayer was needed at this time. CH is available for follow up as needed.    09/14/16 1500  Clinical Encounter Type  Visited With Patient;Patient and family together  Visit Type Initial;Spiritual support  Referral From Nurse

## 2016-09-15 DIAGNOSIS — A419 Sepsis, unspecified organism: Secondary | ICD-10-CM | POA: Diagnosis not present

## 2016-09-15 DIAGNOSIS — N179 Acute kidney failure, unspecified: Secondary | ICD-10-CM | POA: Diagnosis not present

## 2016-09-15 DIAGNOSIS — J9601 Acute respiratory failure with hypoxia: Secondary | ICD-10-CM | POA: Diagnosis not present

## 2016-09-15 DIAGNOSIS — R531 Weakness: Secondary | ICD-10-CM | POA: Diagnosis not present

## 2016-09-15 DIAGNOSIS — J189 Pneumonia, unspecified organism: Secondary | ICD-10-CM | POA: Diagnosis not present

## 2016-09-15 MED ORDER — PREDNISONE 20 MG PO TABS: 20.0000 mg | ORAL_TABLET | Freq: Every day | ORAL | 0 refills | Status: AC

## 2016-09-15 MED ORDER — ALBUTEROL SULFATE HFA 108 (90 BASE) MCG/ACT IN AERS: 2.0000 | INHALATION_SPRAY | Freq: Four times a day (QID) | RESPIRATORY_TRACT | 0 refills | Status: DC | PRN

## 2016-09-15 MED ORDER — DEXTROSE 50 % IV SOLN
INTRAVENOUS | Status: AC
  Filled 2016-09-15: qty 50

## 2016-09-15 MED ORDER — LEVOFLOXACIN 500 MG PO TABS: 500.0000 mg | ORAL_TABLET | Freq: Every day | ORAL | 0 refills | Status: DC

## 2016-09-15 NOTE — Plan of Care (Signed)
Problem: Tissue Perfusion: Goal: Risk factors for ineffective tissue perfusion will decrease Outcome: Progressing BP (!) 142/74 (BP Location: Left Arm)   Pulse 73   Temp 97.7 F (36.5 C) (Oral)   Resp 18   Ht 5\' 10"  (1.778 m)   Wt 95.8 kg (211 lb 1.6 oz)   SpO2 96%   BMI 30.29 kg/m   POC reviewed with patient, cont on self repositioning in bed, vital signs closely monitored and any concerns addressed at this time. Will continue to monitor.  Attempted to wean pt off of oxygen  Possible dc today   Oxygen 2 l/min [place] Mental Orientation: A&O x 4 Telemetry: none Assessment: Completed Skin: wnl  IV: flushes easily, no pain, no blood return Pain: no pain  Environmental changes completed to facilitate rest and relaxation.  Safety Measures: Bed alarm obn, 2/4 bed rails up.  Unit Orientation: Pt and family oriented to room, has received patient guide, and taught how to use call bell system.  Family: Family  at bedside with pt.

## 2016-09-15 NOTE — Care Management Important Message (Signed)
Important Message  Patient Details  Name: Levi Figueroa MRN: [id number] Date of Birth: 1935-02-26   Medicare Important Message Given:  Yes    Shelbie Ammons, RN 09/15/2016, 8:29 AM

## 2016-09-15 NOTE — Discharge Instructions (Addendum)
Resume diet and activity as before   Outpatient Physical therapy for weakness

## 2016-09-15 NOTE — Plan of Care (Signed)
SATURATION QUALIFICATIONS:   Patient Saturations on Room Air at Rest = 97%  Patient Saturations on Room Air while Ambulating = 93%  Patient Saturations on 2 Liters of oxygen while Ambulating =98%  Pt does not qualify for oxygen   Levi Figueroa 09/15/2016, 10:00 AM

## 2016-09-15 NOTE — Discharge Summary (Signed)
[facility] at [place] NAME: Levi Figueroa    MR#:  [id number]  DATE OF BIRTH:  1934-12-12  DATE OF ADMISSION:  09/12/2016 ADMITTING PHYSICIAN: Harvie Bridge, DO  DATE OF DISCHARGE: 09/15/2016 12:10 PM  PRIMARY CARE PHYSICIAN: Levi High III, MD   ADMISSION DIAGNOSIS:  Hypoxia [R09.02] Sepsis, due to unspecified organism ([place]) [A41.9] Pneumonia, unspecified organism [J18.9]  DISCHARGE DIAGNOSIS:  Active Problems:   Sepsis due to pneumonia (Levi Figueroa)   SECONDARY DIAGNOSIS:   Past Medical History:  Diagnosis Date  . CAD (coronary artery disease)   . Cancer ([place])   . Colon polyps   . GERD (gastroesophageal reflux disease)   . Gout   . Hyperlipidemia   . IDA (iron deficiency anemia)   . Kidney stones   . MI (myocardial infarction) 2004  . Mild anemia   . Thrombocytopenia ([place])      ADMITTING HISTORY  HISTORY OF PRESENT ILLNESS: Levi Figueroa is a 81 y.o. male with a known history of MGUS, ITP, GERD, MI  presents to the emergency department For evaluation of pneumonia. Patient was reportedly diagnosed with pneumonia and placed on a 10 day course of Levaquin of which she completed 5 days. Since he started taking Levaquin he has had increasing shortness of breath, cough productive of yellow sputum.  He reports fatigue and weakness but no fevers or chills.  Of note patient has been immunocompromised secondary to steroid therapy for ITP chronically.  Otherwise there has been no change in status. Patient has been taking medication as prescribed and there has been no recent change in medication or diet.  There has been no recent illness, travel or sick contacts.    Patient denies fevers/chills, weakness, dizziness, chest pain, shortness of breath, N/V/C/D, abdominal pain, dysuria/frequency, changes in mental status.   HOSPITAL COURSE:   Levi Figueroa is a 81 y.o. male presenting with Shortness of Breath . Admitted 09/12/2016  1.  Sepsis secondary to community-acquired pneumonia, Bilateral Cultures negative Treated with IV ceftriaxone and oral azithromycin during the hospital stay. Started on prednisone for wheezing. At discharge patient is off his oxygen and feels back to baseline. Saturations were 94% on ambulation. Discharge home on Levaquin for 5 days along with albuterol inhaler.  Acute hypoxic respiratory failure due to pneumonia- resolved  2. Acute kidney injury  Resolved  3. GERD without esophagitis PPI therapy  Stable for discharge home  CONSULTS OBTAINED:    DRUG ALLERGIES:   Allergies  Allergen Reactions  . Penicillins Swelling    Has patient had a PCN reaction causing immediate rash, facial/tongue/throat swelling, SOB or lightheadedness with hypotension: Yes Has patient had a PCN reaction causing severe rash involving mucus membranes or skin necrosis: No Has patient had a PCN reaction that required hospitalization No Has patient had a PCN reaction occurring within the last 10 years: No If all of the above answers are "NO", then may proceed with Cephalosporin use.     DISCHARGE MEDICATIONS:   Discharge Medication List as of 09/15/2016 10:22 AM    START taking these medications   Details  albuterol (PROVENTIL HFA;VENTOLIN HFA) 108 (90 Base) MCG/ACT inhaler Inhale 2 puffs into the lungs every 6 (six) hours as needed for wheezing or shortness of breath., Starting Fri 09/15/2016, Normal    levofloxacin (LEVAQUIN) 500 MG tablet Take 1 tablet (500 mg total) by mouth daily., Starting Fri 09/15/2016, Normal      CONTINUE these medications which have CHANGED  Details  predniSONE (DELTASONE) 20 MG tablet Take 1 tablet (20 mg total) by mouth daily with breakfast., Starting Fri 09/15/2016, Until Tue 09/19/2016, No Print      CONTINUE these medications which have NOT CHANGED   Details  acetaminophen (TYLENOL) 500 MG tablet Take 500 mg by mouth every 6 (six) hours as needed., Historical Med     atorvastatin (LIPITOR) 10 MG tablet Take 10 mg by mouth daily. 1/2 tablet, Historical Med    Cyanocobalamin (VITAMIN B 12 PO) Take 1 Dose by mouth daily., Historical Med    diltiazem (DILACOR XR) 240 MG 24 hr capsule Take 240 mg by mouth daily. , Historical Med    loratadine (CLARITIN) 10 MG tablet Take 10 mg by mouth daily as needed for allergies., Historical Med    metoprolol succinate (TOPROL-XL) 25 MG 24 hr tablet Take 25 mg by mouth 2 (two) times daily., Historical Med    Multiple Vitamins-Minerals (MULTIVITAMIN WITH MINERALS) tablet Take 1 tablet by mouth daily., Historical Med    multivitamin-lutein (OCUVITE-LUTEIN) CAPS capsule Take 2 capsules by mouth daily., Historical Med    nystatin (MYCOSTATIN) 100000 UNIT/ML suspension Take 5 mLs (500,000 Units total) by mouth 4 (four) times daily., Starting Thu 08/24/2016, Normal    Omega-3 Fatty Acids (FISH OIL) 1000 MG CAPS Take 1 capsule by mouth daily., Historical Med    omeprazole (PRILOSEC) 20 MG capsule Take 20 mg by mouth daily., Historical Med      STOP taking these medications     fluconazole (DIFLUCAN) 100 MG tablet         Today   VITAL SIGNS:  Blood pressure (!) 142/74, pulse 73, temperature 97.7 F (36.5 C), temperature source Oral, resp. rate 18, height 5\' 10"  (1.778 m), weight 95.8 kg (211 lb 1.6 oz), SpO2 96 %.  I/O:   Intake/Output Summary (Last 24 hours) at 09/15/16 1444 Last data filed at 09/15/16 0900  Gross per 24 hour  Intake              530 ml  Output                0 ml  Net              530 ml    PHYSICAL EXAMINATION:  Physical Exam  GENERAL:  81 y.o.-year-old patient lying in the bed with no acute distress.  LUNGS: Normal breath sounds bilaterally, no wheezing, rales,rhonchi or crepitation. No use of accessory muscles of respiration.  CARDIOVASCULAR: S1, S2 normal. No murmurs, rubs, or gallops.  ABDOMEN: Soft, non-tender, non-distended. Bowel sounds present. No organomegaly or mass.   NEUROLOGIC: Moves all 4 extremities. PSYCHIATRIC: The patient is alert and oriented x 3.  SKIN: No obvious rash, lesion, or ulcer.   DATA REVIEW:   CBC  Recent Labs Lab 09/13/16 0448  WBC 13.2*  HGB 12.0*  HCT 35.4*  PLT 128*    Chemistries   Recent Labs Lab 09/13/16 0006 09/13/16 0448  NA  --  140  K  --  4.0  CL  --  110  CO2  --  23  GLUCOSE  --  105*  BUN  --  21*  CREATININE  --  1.09  CALCIUM  --  7.9*  MG 1.7  --     Cardiac Enzymes  Recent Labs Lab 09/13/16 0448  TROPONINI 0.03*    Microbiology Results  Results for orders placed or performed during the hospital encounter of 09/12/16  Culture,  blood (routine x 2)     Status: None (Preliminary result)   Collection Time: 09/12/16  8:40 PM  Result Value Ref Range Status   Specimen Description BLOOD RAC  Final   Special Requests BOTTLES DRAWN AEROBIC AND ANAEROBIC 5CC  Final   Culture NO GROWTH 3 DAYS  Final   Report Status PENDING  Incomplete  Culture, blood (routine x 2)     Status: None (Preliminary result)   Collection Time: 09/12/16  8:45 PM  Result Value Ref Range Status   Specimen Description BLOOD  RFA  Final   Special Requests BOTTLES DRAWN AEROBIC AND ANAEROBIC  5CC  Final   Culture NO GROWTH 3 DAYS  Final   Report Status PENDING  Incomplete  Culture, sputum-assessment     Status: None   Collection Time: 09/12/16 11:00 PM  Result Value Ref Range Status   Specimen Description SPUTUM  Final   Special Requests Normal  Final   Sputum evaluation   Final    Sputum specimen not acceptable for testing.  Please recollect.   [facility] ABDUSSALAAM AT 2358 09/12/16.PMH   Report Status 09/13/2016 FINAL  Final  Culture, expectorated sputum-assessment     Status: None   Collection Time: 09/13/16  2:01 PM  Result Value Ref Range Status   Specimen Description EXPECTORATED SPUTUM  Final   Special Requests NONE  Final   Sputum evaluation   Final    Sputum specimen not acceptable for testing.  Please  recollect.   CALLED TO MEGAN OAKLEY 09/13/16 1615 KLW    Report Status 09/13/2016 FINAL  Final    RADIOLOGY:  No results found.  Follow up with PCP in 1 week.  Management plans discussed with the patient, family and they are in agreement.  CODE STATUS:     Code Status Orders        Start     Ordered   09/12/16 2325  Full code  Continuous     09/12/16 2324    Code Status History    Date Active Date Inactive Code Status Order ID Comments User Context   This patient has a current code status but no historical code status.    Advance Directive Documentation   [place] Most Recent Value  Type of Advance Directive  Living will  Pre-existing out of facility DNR order (yellow form or pink MOST form)  No data  "MOST" Form in Place?  No data      TOTAL TIME TAKING CARE OF THIS PATIENT ON DAY OF DISCHARGE: more than 30 minutes.   Hillary Bow R M.D on 09/15/2016 at 2:44 PM  Between 7am to 6pm - Pager - [phone]  After 6pm go to [email or web address] - password [email or web address] [facility]  Office  [phone]  CC: Primary care physician; [name], MD  Note: This dictation was prepared with Dragon dictation along with smaller phrase technology. Any transcriptional errors that result from this process are unintentional.

## 2016-09-15 NOTE — Progress Notes (Signed)
Pt d/c home via wheelchair escorted by staff and family  

## 2016-09-15 NOTE — Care Management (Signed)
Discharge to home today per Dr. Darvin Neighbours. Outpatient service sheet signed and faxed to therapy department. [facility] will be providing the rolling walker. Family will transport. Shelbie Ammons RN MSN CCM Care Management [phone]

## 2016-09-15 NOTE — Progress Notes (Signed)
Pharmacy Antibiotic Note  Levi Figueroa is a 81 y.o. male admitted on 09/12/2016 with pneumonia.  Pharmacy has been consulted for ceftriaxone dosing.  Plan: Day 4 of Abx. Continue Ceftriaxone 1 gram q 24 hours  for CAP. Recommend transitioning to PO alternative for remaining treatment days.   Height: 5\' 10"  (177.8 cm) Weight: 211 lb 1.6 oz (95.8 kg) IBW/kg (Calculated) : 73  Temp (24hrs), Avg:97.8 F (36.6 C), Min:97.7 F (36.5 C), Max:98 F (36.7 C)   Recent Labs Lab 09/12/16 1655 09/12/16 2055 09/12/16 2321 09/13/16 0448  WBC 16.2*  --   --  13.2*  CREATININE 1.39*  --   --  1.09  LATICACIDVEN  --  3.0* 3.9* 1.6    Estimated Creatinine Clearance: 64.9 mL/min (by C-G formula based on SCr of 1.09 mg/dL).    Allergies  Allergen Reactions  . Penicillins Swelling    Has patient had a PCN reaction causing immediate rash, facial/tongue/throat swelling, SOB or lightheadedness with hypotension: Yes Has patient had a PCN reaction causing severe rash involving mucus membranes or skin necrosis: No Has patient had a PCN reaction that required hospitalization No Has patient had a PCN reaction occurring within the last 10 years: No If all of the above answers are "NO", then may proceed with Cephalosporin use.     Antimicrobials this admission: azithromycin  >>  ceftriaxone  >>   Dose adjustments this admission:   Microbiology results: 10/17 BCx: NG x 3 days    Thank you for allowing pharmacy to be a part of this patient's care.  Cary Lothrop M Heddy Vidana 09/15/2016 8:27 AM

## 2016-09-15 NOTE — Plan of Care (Signed)
Problem: Safety: Goal: Ability to remain free from injury will improve Outcome: Progressing Up with rolling walker per PT

## 2016-09-17 LAB — CULTURE, BLOOD (ROUTINE X 2)
Culture: NO GROWTH
Culture: NO GROWTH

## 2016-09-25 DIAGNOSIS — I480 Paroxysmal atrial fibrillation: Secondary | ICD-10-CM | POA: Diagnosis not present

## 2016-09-25 DIAGNOSIS — I2510 Atherosclerotic heart disease of native coronary artery without angina pectoris: Secondary | ICD-10-CM | POA: Diagnosis not present

## 2016-09-25 DIAGNOSIS — J159 Unspecified bacterial pneumonia: Secondary | ICD-10-CM | POA: Diagnosis not present

## 2016-09-25 DIAGNOSIS — M1000 Idiopathic gout, unspecified site: Secondary | ICD-10-CM | POA: Diagnosis not present

## 2016-09-25 DIAGNOSIS — I10 Essential (primary) hypertension: Secondary | ICD-10-CM | POA: Diagnosis not present

## 2016-09-25 DIAGNOSIS — D6489 Other specified anemias: Secondary | ICD-10-CM | POA: Diagnosis not present

## 2016-09-25 DIAGNOSIS — R938 Abnormal findings on diagnostic imaging of other specified body structures: Secondary | ICD-10-CM | POA: Diagnosis not present

## 2016-09-26 ENCOUNTER — Other Ambulatory Visit: Payer: Self-pay | Admitting: Internal Medicine

## 2016-09-26 DIAGNOSIS — J159 Unspecified bacterial pneumonia: Secondary | ICD-10-CM

## 2016-09-27 ENCOUNTER — Inpatient Hospital Stay: Payer: PPO | Attending: Internal Medicine

## 2016-09-27 ENCOUNTER — Inpatient Hospital Stay (HOSPITAL_BASED_OUTPATIENT_CLINIC_OR_DEPARTMENT_OTHER): Payer: PPO | Admitting: Internal Medicine

## 2016-09-27 VITALS — BP 125/79 | HR 80 | Temp 96.9°F | Resp 18 | Wt 172.0 lb

## 2016-09-27 DIAGNOSIS — Z79899 Other long term (current) drug therapy: Secondary | ICD-10-CM | POA: Diagnosis not present

## 2016-09-27 DIAGNOSIS — D72829 Elevated white blood cell count, unspecified: Secondary | ICD-10-CM | POA: Insufficient documentation

## 2016-09-27 DIAGNOSIS — D693 Immune thrombocytopenic purpura: Secondary | ICD-10-CM | POA: Insufficient documentation

## 2016-09-27 DIAGNOSIS — K219 Gastro-esophageal reflux disease without esophagitis: Secondary | ICD-10-CM | POA: Diagnosis not present

## 2016-09-27 DIAGNOSIS — Z85820 Personal history of malignant melanoma of skin: Secondary | ICD-10-CM | POA: Diagnosis not present

## 2016-09-27 DIAGNOSIS — Z86010 Personal history of colonic polyps: Secondary | ICD-10-CM | POA: Insufficient documentation

## 2016-09-27 DIAGNOSIS — R531 Weakness: Secondary | ICD-10-CM

## 2016-09-27 DIAGNOSIS — M109 Gout, unspecified: Secondary | ICD-10-CM

## 2016-09-27 DIAGNOSIS — T380X5A Adverse effect of glucocorticoids and synthetic analogues, initial encounter: Secondary | ICD-10-CM

## 2016-09-27 DIAGNOSIS — Z880 Allergy status to penicillin: Secondary | ICD-10-CM

## 2016-09-27 DIAGNOSIS — E785 Hyperlipidemia, unspecified: Secondary | ICD-10-CM | POA: Insufficient documentation

## 2016-09-27 DIAGNOSIS — G720 Drug-induced myopathy: Secondary | ICD-10-CM

## 2016-09-27 DIAGNOSIS — I252 Old myocardial infarction: Secondary | ICD-10-CM | POA: Diagnosis not present

## 2016-09-27 DIAGNOSIS — D509 Iron deficiency anemia, unspecified: Secondary | ICD-10-CM | POA: Insufficient documentation

## 2016-09-27 DIAGNOSIS — Z803 Family history of malignant neoplasm of breast: Secondary | ICD-10-CM | POA: Insufficient documentation

## 2016-09-27 DIAGNOSIS — Z7952 Long term (current) use of systemic steroids: Secondary | ICD-10-CM | POA: Insufficient documentation

## 2016-09-27 DIAGNOSIS — I2510 Atherosclerotic heart disease of native coronary artery without angina pectoris: Secondary | ICD-10-CM

## 2016-09-27 NOTE — Assessment & Plan Note (Addendum)
#   Chronic thrombocytopenia- likely ITP. S/p Rituxan x 4 [last 08/31/2016]. platlets 107 [pcp]. Discussed that it might take 8-12 weeks to notice improvement in the platelet counts. For now recommend surveillance.  # okay to take baby asprin.   # Oral thrush- resolved.   # steroid myopathy- improving.   # follow up in 3 months/cbc/bmp; monthly labs.

## 2016-09-27 NOTE — Progress Notes (Signed)
Patient is here for follow up, he was in the hospital for Bacterial Double Pneumonia. He mentions some dizziness. He is feeling weak, and tired. No fever since being out of the hospital   bp sitting: 125/79 pulse 80   bp standing:136/75 pulse 85   Spo2: 96%

## 2016-09-27 NOTE — Progress Notes (Signed)
[place] OFFICE PROGRESS NOTE  Patient Care Team: [name], MD as PCP - General (Internal Medicine)   SUMMARY OF ONCOLOGIC HISTORY:  2012- CHRONIC ITP-[ BMBx; 2012- hypercellular 60%; megakaryocytes/no dyspoiesis; FISH- Neg; Dr.Pandit]; [place]- [place]-Neg for spleen/liver; HIV/hepatitis-NEG; OCT 2017- Rituxan weekly x4  # July 2017- Melanoma s/p excision [? Stage; Dr.Dasher]  INTERVAL HISTORY:  A very pleasant 81 year old Caucasian male patient with ITP suboptimal response to steroids; currently on prednisone 80 mg a day; status post Rituxan weekly 4 - approximately a month ago is here for follow-up.  Patient was admitted to the hospital for possible pneumonia. Treat with antibiotics. Improved. Complains of mild weakness in his bilateral lower extremity is. No falls. No bleeding. No fevers or chills.  He otherwise denies any blood in stools black stools. No nausea no vomiting. No weight loss. No skin rash. No night sweats.    REVIEW OF SYSTEMS:  A complete 10 point review of system is done which is negative except mentioned above/history of present illness.   PAST MEDICAL HISTORY :  Past Medical History:  Diagnosis Date  . CAD (coronary artery disease)   . Cancer ([place])   . Colon polyps   . GERD (gastroesophageal reflux disease)   . Gout   . Hyperlipidemia   . IDA (iron deficiency anemia)   . Kidney stones   . MI (myocardial infarction) 2004  . Mild anemia   . Thrombocytopenia ([place])     PAST SURGICAL HISTORY :   Past Surgical History:  Procedure Laterality Date  . BLADDER SURGERY    . CORONARY ARTERY BYPASS GRAFT    . INGUINAL HERNIA REPAIR Right   . LASER ABLATION     x 2 prostatic hypertrophy  . TONSILLECTOMY      FAMILY HISTORY :   Family History  Problem Relation Age of Onset  . Breast cancer Mother   . Diabetes Father   . Heart disease Father   . Heart disease Brother   . Heart disease Brother     SOCIAL HISTORY:   Social History   Substance Use Topics  . Smoking status: Never Smoker  . Smokeless tobacco: Never Used  . Alcohol use No    ALLERGIES:  is allergic to penicillins.  MEDICATIONS:  Current Outpatient Prescriptions  Medication Sig Dispense Refill  . acetaminophen (TYLENOL) 500 MG tablet Take 500 mg by mouth every 6 (six) hours as needed.    Marland Kitchen albuterol (PROVENTIL HFA;VENTOLIN HFA) 108 (90 Base) MCG/ACT inhaler Inhale 2 puffs into the lungs every 6 (six) hours as needed for wheezing or shortness of breath. 1 Inhaler 0  . atorvastatin (LIPITOR) 10 MG tablet Take 10 mg by mouth daily. 1/2 tablet    . Cyanocobalamin (VITAMIN B 12 PO) Take 1 Dose by mouth daily.    Marland Kitchen diltiazem (DILACOR XR) 240 MG 24 hr capsule Take 240 mg by mouth daily.     Marland Kitchen levofloxacin (LEVAQUIN) 500 MG tablet Take 1 tablet (500 mg total) by mouth daily. 5 tablet 0  . loratadine (CLARITIN) 10 MG tablet Take 10 mg by mouth daily as needed for allergies.    . metoprolol succinate (TOPROL-XL) 25 MG 24 hr tablet Take 25 mg by mouth 2 (two) times daily.    . Multiple Vitamins-Minerals (MULTIVITAMIN WITH MINERALS) tablet Take 1 tablet by mouth daily.    . multivitamin-lutein (OCUVITE-LUTEIN) CAPS capsule Take 2 capsules by mouth daily.    Marland Kitchen nystatin (MYCOSTATIN) 100000 UNIT/ML  suspension Take 5 mLs (500,000 Units total) by mouth 4 (four) times daily. 250 mL 1  . Omega-3 Fatty Acids (FISH OIL) 1000 MG CAPS Take 1 capsule by mouth daily.    Marland Kitchen omeprazole (PRILOSEC) 20 MG capsule Take 20 mg by mouth daily.     No current facility-administered medications for this visit.     PHYSICAL EXAMINATION:   BP 125/79 (BP Location: Left Arm, Patient Position: Sitting)   Pulse 80   Temp (!) 96.9 F (36.1 C) (Tympanic)   Resp 18   Wt 171 lb 15.3 oz (78 kg)   BMI 24.67 kg/m   Filed Weights   09/27/16 1140  Weight: 171 lb 15.3 oz (78 kg)    GENERAL: Well-nourished well-developed; Alert, no distress and comfortable.  Accompanied by his wife. EYES:  no pallor or icterus OROPHARYNX: no thrush or ulceration;  NECK: supple, no masses felt LYMPH:  no palpable lymphadenopathy in the cervical, axillary or inguinal regions LUNGS: clear to auscultation and  No wheeze or crackles HEART/CVS: regular rate & rhythm and no murmurs; No lower extremity edema ABDOMEN:abdomen soft, non-tender and normal bowel sounds Musculoskeletal:no cyanosis of digits and no clubbing  PSYCH: alert & oriented x 3 with fluent speech NEURO: no focal motor/sensory deficits SKIN:  no rashes or significant lesions; multiple chronic ecchymosis  LABORATORY DATA:  I have reviewed the data as listed    Component Value Date/Time   NA 140 09/13/2016 0448   NA 140 12/17/2012 1229   K 4.0 09/13/2016 0448   K 4.4 12/17/2012 1229   CL 110 09/13/2016 0448   CL 106 12/17/2012 1229   CO2 23 09/13/2016 0448   CO2 26 12/17/2012 1229   GLUCOSE 105 (H) 09/13/2016 0448   GLUCOSE 77 12/17/2012 1229   BUN 21 (H) 09/13/2016 0448   BUN 19 (H) 12/17/2012 1229   CREATININE 1.09 09/13/2016 0448   CREATININE 1.05 12/17/2012 1229   CALCIUM 7.9 (L) 09/13/2016 0448   CALCIUM 8.4 (L) 12/17/2012 1229   PROT 7.5 06/29/2016 0855   ALBUMIN 4.1 06/29/2016 0855   AST 19 06/29/2016 0855   ALT 17 06/29/2016 0855   ALKPHOS 55 06/29/2016 0855   BILITOT 0.6 06/29/2016 0855   GFRNONAA >60 09/13/2016 0448   GFRNONAA >60 12/17/2012 1229   GFRAA >60 09/13/2016 0448   GFRAA >60 12/17/2012 1229    No results found for: SPEP, UPEP  Lab Results  Component Value Date   WBC 13.2 (H) 09/13/2016   NEUTROABS 21.0 (H) 08/31/2016   HGB 12.0 (L) 09/13/2016   HCT 35.4 (L) 09/13/2016   MCV 89.1 09/13/2016   PLT 128 (L) 09/13/2016      Chemistry      Component Value Date/Time   NA 140 09/13/2016 0448   NA 140 12/17/2012 1229   K 4.0 09/13/2016 0448   K 4.4 12/17/2012 1229   CL 110 09/13/2016 0448   CL 106 12/17/2012 1229   CO2 23 09/13/2016 0448   CO2 26 12/17/2012 1229   BUN 21 (H)  09/13/2016 0448   BUN 19 (H) 12/17/2012 1229   CREATININE 1.09 09/13/2016 0448   CREATININE 1.05 12/17/2012 1229      Component Value Date/Time   CALCIUM 7.9 (L) 09/13/2016 0448   CALCIUM 8.4 (L) 12/17/2012 1229   ALKPHOS 55 06/29/2016 0855   AST 19 06/29/2016 0855   ALT 17 06/29/2016 0855   BILITOT 0.6 06/29/2016 0855       RADIOGRAPHIC STUDIES:  I have personally reviewed the radiological images as listed and agreed with the findings in the report. No results found.   ASSESSMENT & PLAN:   Idiopathic thrombocytopenic purpura ([place]) # Chronic thrombocytopenia- likely ITP. S/p Rituxan x 4 [last 08/31/2016]. platlets 107 [pcp]. Discussed that it might take 8-12 weeks to notice improvement in the platelet counts. For now recommend surveillance.  # okay to take baby asprin.   # Oral thrush- resolved.   # steroid myopathy- improving.   # follow up in 3 months/cbc/bmp; monthly labs.  # Leukocytosis-26/predominant neutrophilia. likely from steroids and monitor for now. No signs of infection.    Cammie Sickle, MD 09/27/2016 5:49 PM

## 2016-09-28 ENCOUNTER — Ambulatory Visit
Admission: RE | Admit: 2016-09-28 | Discharge: 2016-09-28 | Disposition: A | Discharge status: Home / Self Care | Payer: PPO | Source: Ambulatory Visit | Attending: Internal Medicine | Admitting: Internal Medicine

## 2016-09-28 DIAGNOSIS — J159 Unspecified bacterial pneumonia: Secondary | ICD-10-CM | POA: Diagnosis not present

## 2016-09-28 DIAGNOSIS — R918 Other nonspecific abnormal finding of lung field: Secondary | ICD-10-CM | POA: Diagnosis not present

## 2016-09-28 DIAGNOSIS — R590 Localized enlarged lymph nodes: Secondary | ICD-10-CM | POA: Diagnosis not present

## 2016-09-28 DIAGNOSIS — J90 Pleural effusion, not elsewhere classified: Secondary | ICD-10-CM | POA: Diagnosis not present

## 2016-10-02 DIAGNOSIS — I480 Paroxysmal atrial fibrillation: Secondary | ICD-10-CM | POA: Diagnosis not present

## 2016-10-02 DIAGNOSIS — I2510 Atherosclerotic heart disease of native coronary artery without angina pectoris: Secondary | ICD-10-CM | POA: Diagnosis not present

## 2016-10-02 DIAGNOSIS — D72829 Elevated white blood cell count, unspecified: Secondary | ICD-10-CM | POA: Diagnosis not present

## 2016-10-03 DIAGNOSIS — I5022 Chronic systolic (congestive) heart failure: Secondary | ICD-10-CM | POA: Insufficient documentation

## 2016-10-05 DIAGNOSIS — Z85820 Personal history of malignant melanoma of skin: Secondary | ICD-10-CM | POA: Diagnosis not present

## 2016-10-05 DIAGNOSIS — Z0000 Encounter for general adult medical examination without abnormal findings: Secondary | ICD-10-CM | POA: Diagnosis not present

## 2016-10-05 DIAGNOSIS — E784 Other hyperlipidemia: Secondary | ICD-10-CM | POA: Diagnosis not present

## 2016-10-05 DIAGNOSIS — I480 Paroxysmal atrial fibrillation: Secondary | ICD-10-CM | POA: Diagnosis not present

## 2016-10-05 DIAGNOSIS — D649 Anemia, unspecified: Secondary | ICD-10-CM | POA: Diagnosis not present

## 2016-10-05 DIAGNOSIS — I2510 Atherosclerotic heart disease of native coronary artery without angina pectoris: Secondary | ICD-10-CM | POA: Diagnosis not present

## 2016-10-05 DIAGNOSIS — I5022 Chronic systolic (congestive) heart failure: Secondary | ICD-10-CM | POA: Diagnosis not present

## 2016-10-05 DIAGNOSIS — N400 Enlarged prostate without lower urinary tract symptoms: Secondary | ICD-10-CM | POA: Diagnosis not present

## 2016-10-05 DIAGNOSIS — D696 Thrombocytopenia, unspecified: Secondary | ICD-10-CM | POA: Diagnosis not present

## 2016-10-05 DIAGNOSIS — I10 Essential (primary) hypertension: Secondary | ICD-10-CM | POA: Diagnosis not present

## 2016-10-05 DIAGNOSIS — Z23 Encounter for immunization: Secondary | ICD-10-CM | POA: Diagnosis not present

## 2016-10-05 DIAGNOSIS — M1000 Idiopathic gout, unspecified site: Secondary | ICD-10-CM | POA: Diagnosis not present

## 2016-10-05 DIAGNOSIS — I700 Atherosclerosis of aorta: Secondary | ICD-10-CM | POA: Diagnosis not present

## 2016-10-13 ENCOUNTER — Ambulatory Visit: Payer: PPO

## 2016-10-18 ENCOUNTER — Ambulatory Visit: Payer: PPO | Attending: Internal Medicine

## 2016-10-18 DIAGNOSIS — R262 Difficulty in walking, not elsewhere classified: Secondary | ICD-10-CM | POA: Diagnosis not present

## 2016-10-18 DIAGNOSIS — M6281 Muscle weakness (generalized): Secondary | ICD-10-CM

## 2016-10-18 NOTE — Therapy (Signed)
[place] MAIN [facility] SERVICES [address] [place], [place], [address] Phone: [phone]   Fax:  [phone]  Physical Therapy Evaluation  Patient Details  Name: Levi Figueroa MRN: [id number] Date of Birth: 1935-10-12 Referring Provider: Hillary Bow, MD  Encounter Date: 10/18/2016      PT End of Session - 10/18/16 1647    Visit Number 1   Number of Visits 16   Date for PT Re-Evaluation 12/13/16   Authorization Type 1/10 G Code   PT Start Time 1430   PT Stop Time 1525   PT Time Calculation (min) 55 min   Equipment Utilized During Treatment Gait belt   Activity Tolerance Patient tolerated treatment well   Behavior During Therapy [facility] for tasks assessed/performed      Past Medical History:  Diagnosis Date  . CAD (coronary artery disease)   . Cancer ([place])   . Colon polyps   . GERD (gastroesophageal reflux disease)   . Gout   . Hyperlipidemia   . IDA (iron deficiency anemia)   . Kidney stones   . MI (myocardial infarction) 2004  . Mild anemia   . Thrombocytopenia ([place])     Past Surgical History:  Procedure Laterality Date  . BLADDER SURGERY    . CORONARY ARTERY BYPASS GRAFT    . INGUINAL HERNIA REPAIR Right   . LASER ABLATION     x 2 prostatic hypertrophy  . TONSILLECTOMY      There were no vitals filed for this visit.       Subjective Assessment - 10/18/16 1447    Subjective Patient demonstrates difficulty with going from sitting to standing, stairs using rails, walking (with increased weakness), lifting, woodworking, and gardening. Patient reports he's improving since leaving the hospital. Patient reports DOE when ambulating longer distances (100yards). Patient reports he was able to walk up and down a large hill with minimal dificulty.   Pertinent History Hx of skin CA, CHF. Gradual onset of weakness after hospital stay. Patient reports platelets level decreased.   Limitations Lifting;Standing;Walking;House hold  activities   How long can you walk comfortably? .25 miles   Patient Stated Goals Get to back to ADLs   Currently in Pain? No/denies            [facility] PT Assessment - 10/18/16 1443      Assessment   Medical Diagnosis M62.81 (ICD-10-CM) - Muscle weakness (generalized)   Referring Provider Hillary Bow, MD   Onset Date/Surgical Date 09/21/16   Hand Dominance Right   Next MD Visit 11/10/16   Prior Therapy none     Precautions   Precautions None     Restrictions   Weight Bearing Restrictions No     Balance Screen   Has the patient fallen in the past 6 months No   Has the patient had a decrease in activity level because of a fear of falling?  Yes   Is the patient reluctant to leave their home because of a fear of falling?  No     Home Environment   Living Environment Private residence   Living Arrangements Spouse/significant other   Available Help at Discharge Family   Type of [place] to enter   Entrance Stairs-Number of Steps 1   Entrance Stairs-Rails Can reach both   [place] Two level   Alternate Level Stairs-Number of Steps 9   Alternate Level Stairs-Rails Can reach both   [place] -  2 wheels;Shower seat     Prior Function   Level of Independence Independent with basic ADLs   Vocation Retired   [employer] N/A   [place] and gardening     Cognition   Overall Cognitive Status Within Functional Limits for tasks assessed     ROM / Strength   AROM / PROM / Arts administrator   Strength Assessment Site Knee;Ankle;Hip   Right/Left Hip Right;Left   Right Hip Flexion 4-/5   Right Hip ABduction 4-/5   Right Hip ADduction 4-/5   Left Hip Flexion 4-/5   Left Hip ABduction 4-/5   Left Hip ADduction 4-/5   Right/Left Knee Right;Left   Right Knee Flexion 4/5   Right Knee Extension 4+/5   Left Knee Flexion 4-/5   Left Knee Extension 4+/5   Right/Left Ankle Right;Left   Right Ankle  Dorsiflexion 4/5   Right Ankle Plantar Flexion 3+/5   Left Ankle Dorsiflexion 4/5   Left Ankle Plantar Flexion 3+/5     Ambulation/Gait   Ambulation Distance (Feet) 1000 Feet   Gait Pattern Step-through pattern  Decreased BOS during amb     6 minute walk test results    Aerobic Endurance Distance Walked 1365     Standardized Balance Assessment   Standardized Balance Assessment 10 meter walk test;Five Times Sit to Stand;Timed Up and Go Test;Berg Balance Test   Five times sit to stand comments  22sec   10 Meter Walk 1.6m/s     Berg Balance Test   Sit to Stand Able to stand  independently using hands   Standing Unsupported Able to stand safely 2 minutes   Sitting with Back Unsupported but Feet Supported on Floor or Stool Able to sit safely and securely 2 minutes   Stand to Sit Controls descent by using hands   Transfers Able to transfer safely, minor use of hands   Standing Unsupported with Eyes Closed Able to stand 10 seconds with supervision   Standing Ubsupported with Feet Together Able to place feet together independently and stand 1 minute safely   From Standing, Reach Forward with Outstretched Arm Can reach forward >12 cm safely (5")   From Standing Position, Pick up Object from Floor Able to pick up shoe safely and easily   From Standing Position, Turn to Look Behind Over each Shoulder Looks behind one side only/other side shows less weight shift   Turn 360 Degrees Able to turn 360 degrees safely one side only in 4 seconds or less   Standing Unsupported, Alternately Place Feet on Step/Stool Able to stand independently and complete 8 steps >20 seconds   Standing Unsupported, One Foot in Front Able to take small step independently and hold 30 seconds   Standing on One Leg Tries to lift leg/unable to hold 3 seconds but remains standing independently   Total Score 44     Timed Up and Go Test   Normal TUG (seconds) 10.5       TREATMENT: Therapeutic Exercise Sit to stand - 2  x 10 with cueing on body/joint positioning during movement Single leg stance - 2 x 30sec  Tandem ambulation with cueing on foot positioning - x 10 ft        PT Long Term Goals - 10/18/16 1652      PT LONG TERM GOAL #1   Title Patient will improve BERG balance score by 8 to better able perform ADLs and activities such as woodworking  at home and to decrease fall risk.    Baseline BERG: 44   Time 8   Period Weeks   Status New     PT LONG TERM GOAL #2   Title Patient will improve 5xSTS by 6 sec to demonstrate significant improvement in functional LE strength and decrease in fall risk.    Baseline 5xSTS: 22sec   Time 8   Period Weeks   Status New     PT LONG TERM GOAL #3   Title Patient will be independent with HEP to continue benefits of PT after discharge.    Baseline Dependent with exercise performance and technique   Time 8   Period Weeks   Status New               Plan - 10/23/2016 1648    Clinical Impression Statement Pt is a 81 yo right hand dominant male presenting with increased weakness and balance activities secondary to decreased level of activity from illness. Patient demonstrates decreased scoring on the BERG, 5xSTS, and demonstrates difficulty with prolonged walking indicating decreased static/dynamic balance and functional LE strength. In addition patient demonstrates decreased muscular coordination, strength, and endurance and will benefit from further skilled therapy focused on improving limitations to return to prior level of function.    Rehab Potential Good   Clinical Impairments Affecting Rehab Potential (+) highly motivated, family support (-) age, hx of cancer   PT Frequency 2x / week   PT Duration 8 weeks   PT Treatment/Interventions Balance training;Therapeutic exercise;Therapeutic activities;Functional mobility training;Stair training;ADLs/Self Care Home Management;Neuromuscular re-education;Patient/family education;Manual techniques   PT Next Visit  Plan Progress strengthening and balance exercises   PT Home Exercise Plan Sit to stands, SLS balance   Consulted and Agree with Plan of Care Patient      Patient will benefit from skilled therapeutic intervention in order to improve the following deficits and impairments:  Abnormal gait, Decreased balance, Decreased activity tolerance, Difficulty walking, Decreased strength, Decreased endurance, Increased muscle spasms, Decreased coordination, Pain  Visit Diagnosis: Muscle weakness (generalized)  Difficulty in walking, not elsewhere classified      G-Codes - 2016/10/23 1657    Functional Assessment Tool Used BERG, 5xSTS, clinical judgement   Functional Limitation Changing and maintaining body position   Mobility: Walking and Moving Around Current Status [id number]) --   Mobility: Walking and Moving Around Goal Status [id number]) --   Changing and Maintaining Body Position Current Status [id number]) At least 20 percent but less than 40 percent impaired, limited or restricted   Changing and Maintaining Body Position Goal Status [id number]) At least 1 percent but less than 20 percent impaired, limited or restricted       Problem List Patient Active Problem List   Diagnosis Date Noted  . Sepsis due to pneumonia ([place]) 09/12/2016  . MGUS (monoclonal gammopathy of unknown significance) 08/07/2016  . Idiopathic thrombocytopenic purpura ([place]) 06/29/2016    Blythe Stanford, PT DPT 10-23-16, 4:59 PM  [place] MAIN [facility] SERVICES [address] [place], [place], [address] Phone: [phone]   Fax:  [phone]  Name: Levi Figueroa MRN: [id number] Date of Birth: [birth year]

## 2016-10-20 DIAGNOSIS — R600 Localized edema: Secondary | ICD-10-CM | POA: Diagnosis not present

## 2016-10-20 DIAGNOSIS — R630 Anorexia: Secondary | ICD-10-CM | POA: Diagnosis not present

## 2016-10-20 DIAGNOSIS — I5022 Chronic systolic (congestive) heart failure: Secondary | ICD-10-CM | POA: Diagnosis not present

## 2016-10-23 ENCOUNTER — Ambulatory Visit: Payer: PPO

## 2016-10-23 DIAGNOSIS — R630 Anorexia: Secondary | ICD-10-CM | POA: Diagnosis not present

## 2016-10-23 DIAGNOSIS — R600 Localized edema: Secondary | ICD-10-CM | POA: Diagnosis not present

## 2016-10-23 DIAGNOSIS — I5022 Chronic systolic (congestive) heart failure: Secondary | ICD-10-CM | POA: Diagnosis not present

## 2016-10-25 ENCOUNTER — Telehealth: Payer: Self-pay | Admitting: *Deleted

## 2016-10-25 ENCOUNTER — Ambulatory Visit: Payer: PPO

## 2016-10-25 ENCOUNTER — Inpatient Hospital Stay: Payer: PPO

## 2016-10-25 VITALS — BP 140/58

## 2016-10-25 DIAGNOSIS — D693 Immune thrombocytopenic purpura: Secondary | ICD-10-CM | POA: Diagnosis not present

## 2016-10-25 DIAGNOSIS — D472 Monoclonal gammopathy: Secondary | ICD-10-CM

## 2016-10-25 DIAGNOSIS — M6281 Muscle weakness (generalized): Secondary | ICD-10-CM

## 2016-10-25 DIAGNOSIS — R262 Difficulty in walking, not elsewhere classified: Secondary | ICD-10-CM

## 2016-10-25 LAB — CBC WITH DIFFERENTIAL/PLATELET
BASOS ABS: 0.1 10*3/uL (ref 0–0.1)
Basophils Relative: 1 %
Eosinophils Absolute: 0.2 10*3/uL (ref 0–0.7)
Eosinophils Relative: 1 %
HCT: 33.5 % — ABNORMAL LOW (ref 40.0–52.0)
Hemoglobin: 11.1 g/dL — ABNORMAL LOW (ref 13.0–18.0)
Lymphocytes Relative: 10 %
Lymphs Abs: 1.7 10*3/uL (ref 1.0–3.6)
MCH: 29.2 pg (ref 26.0–34.0)
MCHC: 33.1 g/dL (ref 32.0–36.0)
MCV: 88.1 fL (ref 80.0–100.0)
MONOS PCT: 8 %
Monocytes Absolute: 1.4 10*3/uL — ABNORMAL HIGH (ref 0.2–1.0)
NEUTROS ABS: 13.3 10*3/uL — AB (ref 1.4–6.5)
NEUTROS PCT: 80 %
PLATELETS: 130 10*3/uL — AB (ref 150–440)
RBC: 3.8 MIL/uL — ABNORMAL LOW (ref 4.40–5.90)
RDW: 15.2 % — AB (ref 11.5–14.5)
WBC: 16.7 10*3/uL — AB (ref 3.8–10.6)

## 2016-10-25 NOTE — Telephone Encounter (Signed)
-----   Message from Cammie Sickle, MD sent at 10/25/2016 12:59 PM EST ----- Please inform patient that platelets are improving at 1:30; slightly elevated white count/as before not significant change. Monitor for now as planned.

## 2016-10-25 NOTE — Telephone Encounter (Signed)
-----   Message from Cammie Sickle, MD sent at 10/25/2016  1:02 PM EST ----- Also; order iron studies/ ferritin/ b12/folate/retic count at next lab draw. Thx

## 2016-10-25 NOTE — Therapy (Signed)
[place] MAIN [facility] SERVICES [address] [place], [place], [address] Phone: [phone]   Fax:  [phone]  Physical Therapy Treatment  Patient Details  Name: Levi Figueroa MRN: [id number] Date of Birth: Apr 21, 1935 Referring Provider: Hillary Bow, MD  Encounter Date: 10/25/2016      PT End of Session - 10/25/16 1601    Visit Number 2   Number of Visits 16   Date for PT Re-Evaluation 12/13/16   Authorization Type 2/10 G Code   PT Start Time [id number]   PT Stop Time 1555   PT Time Calculation (min) 40 min   Equipment Utilized During Treatment Gait belt   Activity Tolerance Patient tolerated treatment well   Behavior During Therapy [facility] for tasks assessed/performed      Past Medical History:  Diagnosis Date  . CAD (coronary artery disease)   . Cancer ([place])   . Colon polyps   . GERD (gastroesophageal reflux disease)   . Gout   . Hyperlipidemia   . IDA (iron deficiency anemia)   . Kidney stones   . MI (myocardial infarction) 2004  . Mild anemia   . Thrombocytopenia ([place])     Past Surgical History:  Procedure Laterality Date  . BLADDER SURGERY    . CORONARY ARTERY BYPASS GRAFT    . INGUINAL HERNIA REPAIR Right   . LASER ABLATION     x 2 prostatic hypertrophy  . TONSILLECTOMY      Vitals:   10/25/16 1519  BP: (!) 140/58        Subjective Assessment - 10/25/16 1516    Subjective Patient reports increased soreness after the previous visit. He states increased achniness and soreness along anterior aspect of his thighs.    Pertinent History Hx of skin CA, CHF. Gradual onset of weakness after hospital stay. Patient reports platelets level decreased.   Limitations Lifting;Standing;Walking;House hold activities   How long can you walk comfortably? .25 miles   Patient Stated Goals Get to back to ADLs      TREATMENT: Therapeutic Exercise Leg Press at quantum - 105# 3 x 12 Hip abduction with UE support - 2 x 20  Hip  extension with UE support - 2 x 20 Hip abduction in sitting - 2 x 20 GTB Hip adduction/glute squeeze - 2 x 20 against ball  Tandem ambulation with cueing on foot positioning - forward/backward 6 x 20 ft Rotational cone taps from airex pad - 2 x 20 B Single leg stance on airex pad B- 3 x 30sec  Monster walks with red band - 4 x 38ft  Backwards amb with RTB around knees - 4 x 8ft       PT Education - 10/25/16 1601    Education provided Yes   Education Details HEP: sit to stands, seated clams, ball squeeze glute squeeze   Person(s) Educated Patient   Methods Explanation;Demonstration   Comprehension Verbalized understanding;Returned demonstration             PT Long Term Goals - 10/18/16 1652      PT LONG TERM GOAL #1   Title Patient will improve BERG balance score by 8 to better able perform ADLs and activities such as woodworking at home and to decrease fall risk.    Baseline BERG: 44   Time 8   Period Weeks   Status New     PT LONG TERM GOAL #2   Title Patient will improve 5xSTS by 6 sec to  demonstrate significant improvement in functional LE strength and decrease in fall risk.    Baseline 5xSTS: 22sec   Time 8   Period Weeks   Status New     PT LONG TERM GOAL #3   Title Patient will be independent with HEP to continue benefits of PT after discharge.    Baseline Dependent with exercise performance and technique   Time 8   Period Weeks   Status New               Plan - 10/25/16 1602    Clinical Impression Statement Patient main deficits are muscular strength and endurance based with moderate difficulties with dynamic balance. Patient demonstrates increased fatigue early into set range most notably with quadriceps activation indicaitng decreased strength and endurance. Patient will benefit from further skilled therapy focused on performing muscular endurance/strength to return to prior level of function.    Rehab Potential Good   Clinical Impairments  Affecting Rehab Potential (+) highly motivated, family support (-) age, hx of cancer   PT Frequency 2x / week   PT Duration 8 weeks   PT Treatment/Interventions Balance training;Therapeutic exercise;Therapeutic activities;Functional mobility training;Stair training;ADLs/Self Care Home Management;Neuromuscular re-education;Patient/family education;Manual techniques   PT Next Visit Plan Progress strengthening and balance exercises   PT Home Exercise Plan Sit to stands, SLS balance   Consulted and Agree with Plan of Care Patient      Patient will benefit from skilled therapeutic intervention in order to improve the following deficits and impairments:  Abnormal gait, Decreased balance, Decreased activity tolerance, Difficulty walking, Decreased strength, Decreased endurance, Increased muscle spasms, Decreased coordination, Pain  Visit Diagnosis: Difficulty in walking, not elsewhere classified  Muscle weakness (generalized)     Problem List Patient Active Problem List   Diagnosis Date Noted  . Sepsis due to pneumonia ([place]) 09/12/2016  . MGUS (monoclonal gammopathy of unknown significance) 08/07/2016  . Idiopathic thrombocytopenic purpura ([place]) 06/29/2016    Blythe Stanford, PT DPT 10/25/2016, 4:05 PM  [place] MAIN [facility] SERVICES [address] [place], [place], [address] Phone: [phone]   Fax:  [phone]  Name: Levi Figueroa MRN: [id number] Date of Birth: [birth year]

## 2016-10-25 NOTE — Telephone Encounter (Signed)
Spoke with patient. Lab results provided to patient. Labs added per md order

## 2016-11-01 ENCOUNTER — Ambulatory Visit: Payer: PPO | Attending: Internal Medicine

## 2016-11-01 DIAGNOSIS — R262 Difficulty in walking, not elsewhere classified: Secondary | ICD-10-CM

## 2016-11-01 DIAGNOSIS — M6281 Muscle weakness (generalized): Secondary | ICD-10-CM | POA: Insufficient documentation

## 2016-11-01 NOTE — Therapy (Signed)
[place] MAIN [facility] SERVICES [address] [place], [place], [address] Phone: [phone]   Fax:  [phone]  Physical Therapy Treatment  Patient Details  Name: Levi Figueroa MRN: [id number] Date of Birth: October 13, 1935 Referring Provider: Hillary Bow, MD  Encounter Date: 11/01/2016      PT End of Session - 11/01/16 0934    Visit Number 3   Number of Visits 16   Date for PT Re-Evaluation 12/13/16   Authorization Type 3/10 G Code   PT Start Time 0845   PT Stop Time 0930   PT Time Calculation (min) 45 min   Equipment Utilized During Treatment Gait belt   Activity Tolerance Patient tolerated treatment well   Behavior During Therapy [facility] for tasks assessed/performed      Past Medical History:  Diagnosis Date  . CAD (coronary artery disease)   . Cancer ([place])   . Colon polyps   . GERD (gastroesophageal reflux disease)   . Gout   . Hyperlipidemia   . IDA (iron deficiency anemia)   . Kidney stones   . MI (myocardial infarction) 2004  . Mild anemia   . Thrombocytopenia ([place])     Past Surgical History:  Procedure Laterality Date  . BLADDER SURGERY    . CORONARY ARTERY BYPASS GRAFT    . INGUINAL HERNIA REPAIR Right   . LASER ABLATION     x 2 prostatic hypertrophy  . TONSILLECTOMY      There were no vitals filed for this visit.      Subjective Assessment - 11/01/16 0849    Subjective Patient reports no change in symptoms since previous visit. Patient states he was able to hang some Christmas decorations with minimal difficulty.    Pertinent History Hx of skin CA, CHF. Gradual onset of weakness after hospital stay. Patient reports platelets level decreased.   Limitations Lifting;Standing;Walking;House hold activities   How long can you walk comfortably? .25 miles   Patient Stated Goals Get to back to ADLs   Currently in Pain? No/denies      TREATMENT: Therapeutic Exercise: Nustep seat at 10 at level 5 for warmup Sit to  stands with unilateral push off with L UE - 2 x 7  Leg Press at quantum - 105# 2 x 20 Hip adduction/glute squeeze - 2 x 20 against ball  Hip abduction with UE support - 2 x 20  Hip extension with UE support - 2 x 20 Single leg stance on airex pad B- 3 x 30sec  Side stepping on 6" step with airex pad - x10 Mini squats with UE support - 2 x 10       PT Education - 11/01/16 0934    Education provided Yes   Education Details HEP   Person(s) Educated Patient   Methods Explanation;Demonstration;Handout   Comprehension Verbalized understanding;Returned demonstration             PT Long Term Goals - 10/18/16 1652      PT LONG TERM GOAL #1   Title Patient will improve BERG balance score by 8 to better able perform ADLs and activities such as woodworking at home and to decrease fall risk.    Baseline BERG: 44   Time 8   Period Weeks   Status New     PT LONG TERM GOAL #2   Title Patient will improve 5xSTS by 6 sec to demonstrate significant improvement in functional LE strength and decrease in fall risk.  Baseline 5xSTS: 22sec   Time 8   Period Weeks   Status New     PT LONG TERM GOAL #3   Title Patient will be independent with HEP to continue benefits of PT after discharge.    Baseline Dependent with exercise performance and technique   Time 8   Period Weeks   Status New               Plan - 11/01/16 0934    Clinical Impression Statement Patient demosntrates improved quadriceps strength today versus previous visit indicating functional carryover between visits. Although patient is improving, he continues to demonstrate decreased strength and static balance; patient will benefit from further skilled therapy to return to prior level of function.    Rehab Potential Good   Clinical Impairments Affecting Rehab Potential (+) highly motivated, family support (-) age, hx of cancer   PT Frequency 2x / week   PT Duration 8 weeks   PT Treatment/Interventions Balance  training;Therapeutic exercise;Therapeutic activities;Functional mobility training;Stair training;ADLs/Self Care Home Management;Neuromuscular re-education;Patient/family education;Manual techniques   PT Next Visit Plan Progress strengthening and balance exercises   PT Home Exercise Plan Sit to stands, SLS balance   Consulted and Agree with Plan of Care Patient      Patient will benefit from skilled therapeutic intervention in order to improve the following deficits and impairments:  Abnormal gait, Decreased balance, Decreased activity tolerance, Difficulty walking, Decreased strength, Decreased endurance, Increased muscle spasms, Decreased coordination, Pain  Visit Diagnosis: Difficulty in walking, not elsewhere classified  Muscle weakness (generalized)     Problem List Patient Active Problem List   Diagnosis Date Noted  . Sepsis due to pneumonia ([place]) 09/12/2016  . MGUS (monoclonal gammopathy of unknown significance) 08/07/2016  . Idiopathic thrombocytopenic purpura ([place]) 06/29/2016    Blythe Stanford, PT DPT 11/01/2016, 9:41 AM  [place] MAIN [facility] SERVICES [address] [place], [place], [address] Phone: [phone]   Fax:  [phone]  Name: WOODROE DUET MRN: [id number] Date of Birth: 1935-10-02

## 2016-11-06 ENCOUNTER — Ambulatory Visit: Payer: PPO | Admitting: Physical Therapy

## 2016-11-07 DIAGNOSIS — H353132 Nonexudative age-related macular degeneration, bilateral, intermediate dry stage: Secondary | ICD-10-CM | POA: Diagnosis not present

## 2016-11-08 DIAGNOSIS — D649 Anemia, unspecified: Secondary | ICD-10-CM | POA: Diagnosis not present

## 2016-11-08 DIAGNOSIS — I2510 Atherosclerotic heart disease of native coronary artery without angina pectoris: Secondary | ICD-10-CM | POA: Diagnosis not present

## 2016-11-08 DIAGNOSIS — I5022 Chronic systolic (congestive) heart failure: Secondary | ICD-10-CM | POA: Diagnosis not present

## 2016-11-08 DIAGNOSIS — M1000 Idiopathic gout, unspecified site: Secondary | ICD-10-CM | POA: Diagnosis not present

## 2016-11-08 DIAGNOSIS — K219 Gastro-esophageal reflux disease without esophagitis: Secondary | ICD-10-CM | POA: Diagnosis not present

## 2016-11-08 DIAGNOSIS — I700 Atherosclerosis of aorta: Secondary | ICD-10-CM | POA: Diagnosis not present

## 2016-11-08 DIAGNOSIS — I10 Essential (primary) hypertension: Secondary | ICD-10-CM | POA: Diagnosis not present

## 2016-11-08 DIAGNOSIS — E784 Other hyperlipidemia: Secondary | ICD-10-CM | POA: Diagnosis not present

## 2016-11-08 DIAGNOSIS — I480 Paroxysmal atrial fibrillation: Secondary | ICD-10-CM | POA: Diagnosis not present

## 2016-11-08 DIAGNOSIS — Z85820 Personal history of malignant melanoma of skin: Secondary | ICD-10-CM | POA: Diagnosis not present

## 2016-11-09 ENCOUNTER — Ambulatory Visit: Payer: PPO

## 2016-11-13 DIAGNOSIS — Z85828 Personal history of other malignant neoplasm of skin: Secondary | ICD-10-CM | POA: Diagnosis not present

## 2016-11-13 DIAGNOSIS — L570 Actinic keratosis: Secondary | ICD-10-CM | POA: Diagnosis not present

## 2016-11-13 DIAGNOSIS — Q828 Other specified congenital malformations of skin: Secondary | ICD-10-CM | POA: Diagnosis not present

## 2016-11-13 DIAGNOSIS — X32XXXA Exposure to sunlight, initial encounter: Secondary | ICD-10-CM | POA: Diagnosis not present

## 2016-11-13 DIAGNOSIS — Z08 Encounter for follow-up examination after completed treatment for malignant neoplasm: Secondary | ICD-10-CM | POA: Diagnosis not present

## 2016-11-13 DIAGNOSIS — Z85820 Personal history of malignant melanoma of skin: Secondary | ICD-10-CM | POA: Diagnosis not present

## 2016-11-13 DIAGNOSIS — C44222 Squamous cell carcinoma of skin of right ear and external auricular canal: Secondary | ICD-10-CM | POA: Diagnosis not present

## 2016-11-13 DIAGNOSIS — D485 Neoplasm of uncertain behavior of skin: Secondary | ICD-10-CM | POA: Diagnosis not present

## 2016-11-15 ENCOUNTER — Encounter: Payer: PPO | Admitting: Physical Therapy

## 2016-11-17 ENCOUNTER — Other Ambulatory Visit: Payer: Self-pay | Admitting: Internal Medicine

## 2016-11-17 DIAGNOSIS — Z8701 Personal history of pneumonia (recurrent): Secondary | ICD-10-CM

## 2016-11-22 ENCOUNTER — Ambulatory Visit: Payer: PPO

## 2016-11-22 ENCOUNTER — Inpatient Hospital Stay: Payer: PPO | Attending: Internal Medicine

## 2016-11-22 DIAGNOSIS — D693 Immune thrombocytopenic purpura: Secondary | ICD-10-CM | POA: Diagnosis not present

## 2016-11-22 DIAGNOSIS — D472 Monoclonal gammopathy: Secondary | ICD-10-CM

## 2016-11-22 LAB — IRON AND TIBC
IRON: 53 ug/dL (ref 45–182)
Saturation Ratios: 20 % (ref 17.9–39.5)
TIBC: 264 ug/dL (ref 250–450)
UIBC: 211 ug/dL

## 2016-11-22 LAB — VITAMIN B12: Vitamin B-12: 570 pg/mL (ref 180–914)

## 2016-11-22 LAB — FERRITIN: FERRITIN: 111 ng/mL (ref 24–336)

## 2016-11-22 LAB — FOLATE: FOLATE: 34 ng/mL (ref >5.9)

## 2016-12-18 DIAGNOSIS — J40 Bronchitis, not specified as acute or chronic: Secondary | ICD-10-CM | POA: Diagnosis not present

## 2016-12-18 DIAGNOSIS — I5022 Chronic systolic (congestive) heart failure: Secondary | ICD-10-CM | POA: Diagnosis not present

## 2016-12-18 DIAGNOSIS — I480 Paroxysmal atrial fibrillation: Secondary | ICD-10-CM | POA: Diagnosis not present

## 2016-12-20 DIAGNOSIS — I5022 Chronic systolic (congestive) heart failure: Secondary | ICD-10-CM | POA: Diagnosis not present

## 2016-12-20 DIAGNOSIS — J069 Acute upper respiratory infection, unspecified: Secondary | ICD-10-CM | POA: Diagnosis not present

## 2016-12-20 DIAGNOSIS — I700 Atherosclerosis of aorta: Secondary | ICD-10-CM | POA: Diagnosis not present

## 2016-12-20 DIAGNOSIS — I10 Essential (primary) hypertension: Secondary | ICD-10-CM | POA: Diagnosis not present

## 2016-12-20 DIAGNOSIS — I2510 Atherosclerotic heart disease of native coronary artery without angina pectoris: Secondary | ICD-10-CM | POA: Diagnosis not present

## 2016-12-20 DIAGNOSIS — D696 Thrombocytopenia, unspecified: Secondary | ICD-10-CM | POA: Diagnosis not present

## 2016-12-20 DIAGNOSIS — I480 Paroxysmal atrial fibrillation: Secondary | ICD-10-CM | POA: Diagnosis not present

## 2016-12-28 ENCOUNTER — Inpatient Hospital Stay: Payer: PPO | Attending: Internal Medicine

## 2016-12-28 ENCOUNTER — Other Ambulatory Visit: Payer: Self-pay

## 2016-12-28 ENCOUNTER — Inpatient Hospital Stay (HOSPITAL_BASED_OUTPATIENT_CLINIC_OR_DEPARTMENT_OTHER): Payer: PPO | Admitting: Internal Medicine

## 2016-12-28 VITALS — BP 144/77 | HR 71 | Temp 97.6°F | Resp 20 | Ht 70.0 in | Wt 172.0 lb

## 2016-12-28 DIAGNOSIS — Z85820 Personal history of malignant melanoma of skin: Secondary | ICD-10-CM

## 2016-12-28 DIAGNOSIS — E785 Hyperlipidemia, unspecified: Secondary | ICD-10-CM | POA: Diagnosis not present

## 2016-12-28 DIAGNOSIS — D509 Iron deficiency anemia, unspecified: Secondary | ICD-10-CM | POA: Diagnosis not present

## 2016-12-28 DIAGNOSIS — Z880 Allergy status to penicillin: Secondary | ICD-10-CM | POA: Diagnosis not present

## 2016-12-28 DIAGNOSIS — Z87442 Personal history of urinary calculi: Secondary | ICD-10-CM | POA: Diagnosis not present

## 2016-12-28 DIAGNOSIS — K219 Gastro-esophageal reflux disease without esophagitis: Secondary | ICD-10-CM

## 2016-12-28 DIAGNOSIS — G720 Drug-induced myopathy: Secondary | ICD-10-CM | POA: Diagnosis not present

## 2016-12-28 DIAGNOSIS — Z79899 Other long term (current) drug therapy: Secondary | ICD-10-CM

## 2016-12-28 DIAGNOSIS — Z7952 Long term (current) use of systemic steroids: Secondary | ICD-10-CM

## 2016-12-28 DIAGNOSIS — Z803 Family history of malignant neoplasm of breast: Secondary | ICD-10-CM | POA: Diagnosis not present

## 2016-12-28 DIAGNOSIS — T380X5A Adverse effect of glucocorticoids and synthetic analogues, initial encounter: Secondary | ICD-10-CM | POA: Diagnosis not present

## 2016-12-28 DIAGNOSIS — Z86010 Personal history of colonic polyps: Secondary | ICD-10-CM

## 2016-12-28 DIAGNOSIS — I252 Old myocardial infarction: Secondary | ICD-10-CM

## 2016-12-28 DIAGNOSIS — I2510 Atherosclerotic heart disease of native coronary artery without angina pectoris: Secondary | ICD-10-CM | POA: Insufficient documentation

## 2016-12-28 DIAGNOSIS — D693 Immune thrombocytopenic purpura: Secondary | ICD-10-CM

## 2016-12-28 DIAGNOSIS — M109 Gout, unspecified: Secondary | ICD-10-CM

## 2016-12-28 LAB — CBC WITH DIFFERENTIAL/PLATELET
BASOS PCT: 1 %
Basophils Absolute: 0.1 10*3/uL (ref 0–0.1)
EOS ABS: 0.2 10*3/uL (ref 0–0.7)
Eosinophils Relative: 2 %
HCT: 42 % (ref 40.0–52.0)
HEMOGLOBIN: 13.8 g/dL (ref 13.0–18.0)
Lymphocytes Relative: 10 %
Lymphs Abs: 1.5 10*3/uL (ref 1.0–3.6)
MCH: 28.2 pg (ref 26.0–34.0)
MCHC: 32.9 g/dL (ref 32.0–36.0)
MCV: 85.7 fL (ref 80.0–100.0)
MONOS PCT: 8 %
Monocytes Absolute: 1.1 10*3/uL — ABNORMAL HIGH (ref 0.2–1.0)
NEUTROS PCT: 80 %
Neutro Abs: 11.7 10*3/uL — ABNORMAL HIGH (ref 1.4–6.5)
Platelets: 78 10*3/uL — ABNORMAL LOW (ref 150–440)
RBC: 4.91 MIL/uL (ref 4.40–5.90)
RDW: 14.6 % — AB (ref 11.5–14.5)
WBC: 14.7 10*3/uL — ABNORMAL HIGH (ref 3.8–10.6)

## 2016-12-28 LAB — BASIC METABOLIC PANEL
Anion gap: 6 (ref 5–15)
BUN: 19 mg/dL (ref 6–20)
CALCIUM: 9 mg/dL (ref 8.9–10.3)
CHLORIDE: 109 mmol/L (ref 101–111)
CO2: 27 mmol/L (ref 22–32)
CREATININE: 0.92 mg/dL (ref 0.61–1.24)
GFR calc non Af Amer: >60 mL/min (ref >60)
Glucose, Bld: 110 mg/dL — ABNORMAL HIGH (ref 65–99)
Potassium: 3.8 mmol/L (ref 3.5–5.1)
SODIUM: 142 mmol/L (ref 135–145)

## 2016-12-28 NOTE — Progress Notes (Signed)
[place] OFFICE PROGRESS NOTE  Patient Care Team: [name], MD as PCP - General (Internal Medicine)   SUMMARY OF ONCOLOGIC HISTORY:  2012- CHRONIC ITP-[ BMBx; 2012- hypercellular 60%; megakaryocytes/no dyspoiesis; FISH- Neg; Dr.Pandit]; [place]- Abdo-Neg for spleen/liver; HIV/hepatitis-NEG; OCT 2017- Rituxan weekly x4  # July 2017- Melanoma s/p excision [? Stage; Dr.Dasher]  INTERVAL HISTORY:  A very pleasant 82 year old Caucasian male patient with ITP suboptimal response to steroids; currently on prednisone 80 mg a day; status post Rituxan weekly 4 - approximately 3 months ago is here for follow-up.  Patient had a recent episode of bronchitis needing to be treated with antibiotics with doxycycline. Improving. No bleeding.   He otherwise denies any blood in stools black stools. No nausea no vomiting. No weight loss. No skin rash. No night sweats.   REVIEW OF SYSTEMS:  A complete 10 point review of system is done which is negative except mentioned above/history of present illness.   PAST MEDICAL HISTORY :  Past Medical History:  Diagnosis Date  . CAD (coronary artery disease)   . Cancer ([place])   . Colon polyps   . GERD (gastroesophageal reflux disease)   . Gout   . Hyperlipidemia   . IDA (iron deficiency anemia)   . Kidney stones   . MI (myocardial infarction) 2004  . Mild anemia   . Thrombocytopenia ([place])     PAST SURGICAL HISTORY :   Past Surgical History:  Procedure Laterality Date  . BLADDER SURGERY    . CORONARY ARTERY BYPASS GRAFT    . INGUINAL HERNIA REPAIR Right   . LASER ABLATION     x 2 prostatic hypertrophy  . TONSILLECTOMY      FAMILY HISTORY :   Family History  Problem Relation Age of Onset  . Breast cancer Mother   . Diabetes Father   . Heart disease Father   . Heart disease Brother   . Heart disease Brother     SOCIAL HISTORY:   Social History  Substance Use Topics  . Smoking status: Never Smoker  . Smokeless tobacco:  Never Used  . Alcohol use No    ALLERGIES:  is allergic to penicillins.  MEDICATIONS:  Current Outpatient Prescriptions  Medication Sig Dispense Refill  . acetaminophen (TYLENOL) 500 MG tablet Take 500 mg by mouth every 6 (six) hours as needed.    Marland Kitchen albuterol (PROVENTIL HFA;VENTOLIN HFA) 108 (90 Base) MCG/ACT inhaler Inhale 2 puffs into the lungs every 6 (six) hours as needed for wheezing or shortness of breath. 1 Inhaler 0  . atorvastatin (LIPITOR) 10 MG tablet Take 10 mg by mouth daily. 1/2 tablet    . Cyanocobalamin (VITAMIN B 12 PO) Take 1 Dose by mouth daily.    Marland Kitchen diltiazem (DILACOR XR) 240 MG 24 hr capsule Take 240 mg by mouth daily.     Marland Kitchen doxycycline (VIBRAMYCIN) 100 MG capsule Take 1 capsule by mouth 2 (two) times daily. X 7 days    . metoprolol succinate (TOPROL-XL) 25 MG 24 hr tablet Take 25 mg by mouth 2 (two) times daily.    . Multiple Vitamins-Minerals (MULTIVITAMIN WITH MINERALS) tablet Take 1 tablet by mouth daily.    . multivitamin-lutein (OCUVITE-LUTEIN) CAPS capsule Take 2 capsules by mouth daily.    . Omega-3 Fatty Acids (FISH OIL) 1000 MG CAPS Take 1 capsule by mouth daily.    Marland Kitchen omeprazole (PRILOSEC) 20 MG capsule Take 20 mg by mouth daily.    Marland Kitchen loratadine (  CLARITIN) 10 MG tablet Take 10 mg by mouth daily as needed for allergies.     No current facility-administered medications for this visit.     PHYSICAL EXAMINATION:   BP (!) 144/77 (Patient Position: Sitting)   Pulse 71   Temp 97.6 F (36.4 C) (Tympanic)   Resp 20   Ht 5\' 10"  (1.778 m)   Wt 172 lb (78 kg)   BMI 24.68 kg/m   Filed Weights   12/28/16 1033  Weight: 172 lb (78 kg)    GENERAL: Well-nourished well-developed; Alert, no distress and comfortable.  Accompanied by his wife. EYES: no pallor or icterus OROPHARYNX: no thrush or ulceration;  NECK: supple, no masses felt LYMPH:  no palpable lymphadenopathy in the cervical, axillary or inguinal regions LUNGS: clear to auscultation and  No wheeze  or crackles HEART/CVS: regular rate & rhythm and no murmurs; No lower extremity edema ABDOMEN:abdomen soft, non-tender and normal bowel sounds Musculoskeletal:no cyanosis of digits and no clubbing  PSYCH: alert & oriented x 3 with fluent speech NEURO: no focal motor/sensory deficits SKIN:  no rashes or significant lesions; multiple chronic ecchymosis  LABORATORY DATA:  I have reviewed the data as listed    Component Value Date/Time   NA 142 12/28/2016 1010   NA 140 12/17/2012 1229   K 3.8 12/28/2016 1010   K 4.4 12/17/2012 1229   CL 109 12/28/2016 1010   CL 106 12/17/2012 1229   CO2 27 12/28/2016 1010   CO2 26 12/17/2012 1229   GLUCOSE 110 (H) 12/28/2016 1010   GLUCOSE 77 12/17/2012 1229   BUN 19 12/28/2016 1010   BUN 19 (H) 12/17/2012 1229   CREATININE 0.92 12/28/2016 1010   CREATININE 1.05 12/17/2012 1229   CALCIUM 9.0 12/28/2016 1010   CALCIUM 8.4 (L) 12/17/2012 1229   PROT 7.5 06/29/2016 0855   ALBUMIN 4.1 06/29/2016 0855   AST 19 06/29/2016 0855   ALT 17 06/29/2016 0855   ALKPHOS 55 06/29/2016 0855   BILITOT 0.6 06/29/2016 0855   GFRNONAA >60 12/28/2016 1010   GFRNONAA >60 12/17/2012 1229   GFRAA >60 12/28/2016 1010   GFRAA >60 12/17/2012 1229    No results found for: SPEP, UPEP  Lab Results  Component Value Date   WBC 14.7 (H) 12/28/2016   NEUTROABS 11.7 (H) 12/28/2016   HGB 13.8 12/28/2016   HCT 42.0 12/28/2016   MCV 85.7 12/28/2016   PLT 78 (L) 12/28/2016      Chemistry      Component Value Date/Time   NA 142 12/28/2016 1010   NA 140 12/17/2012 1229   K 3.8 12/28/2016 1010   K 4.4 12/17/2012 1229   CL 109 12/28/2016 1010   CL 106 12/17/2012 1229   CO2 27 12/28/2016 1010   CO2 26 12/17/2012 1229   BUN 19 12/28/2016 1010   BUN 19 (H) 12/17/2012 1229   CREATININE 0.92 12/28/2016 1010   CREATININE 1.05 12/17/2012 1229      Component Value Date/Time   CALCIUM 9.0 12/28/2016 1010   CALCIUM 8.4 (L) 12/17/2012 1229   ALKPHOS 55 06/29/2016 0855    AST 19 06/29/2016 0855   ALT 17 06/29/2016 0855   BILITOT 0.6 06/29/2016 0855       RADIOGRAPHIC STUDIES: I have personally reviewed the radiological images as listed and agreed with the findings in the report. No results found.   ASSESSMENT & PLAN:   Idiopathic thrombocytopenic purpura ([place]) # Chronic thrombocytopenia- likely ITP. S/p Rituxan x 4 [  last 08/31/2016]. platlets 78.  Discussed that it might take 8-12 weeks to notice improvement in the platelet counts. Discussed multiple options- if and when the patient needs treatment for low platelets. For now recommend  surveillance.   # recent bronchitis- improving on doxycycline.  # okay to take baby asprin.   # Oral thrush- resolved.   # steroid myopathy- improving.   # follow up in 3 months/cbc monthly labs.      Cammie Sickle, MD 12/28/2016 10:52 AM

## 2016-12-28 NOTE — Progress Notes (Signed)
Patient here for followup for ITP. He has no medical complaints

## 2016-12-28 NOTE — Assessment & Plan Note (Addendum)
#   Chronic thrombocytopenia- likely ITP. S/p Rituxan x 4 [last 08/31/2016]. platlets 78.  Discussed that it might take 8-12 weeks to notice improvement in the platelet counts. Discussed multiple options- if and when the patient needs treatment for low platelets. For now recommend  surveillance.   # recent bronchitis- improving on doxycycline.  # okay to take baby asprin.   # Oral thrush- resolved.   # steroid myopathy- improving.   # follow up in 3 months/cbc monthly labs.

## 2017-01-11 ENCOUNTER — Ambulatory Visit
Admission: RE | Admit: 2017-01-11 | Discharge: 2017-01-11 | Disposition: A | Discharge status: Home / Self Care | Payer: PPO | Source: Ambulatory Visit | Attending: Internal Medicine | Admitting: Internal Medicine

## 2017-01-11 DIAGNOSIS — K8020 Calculus of gallbladder without cholecystitis without obstruction: Secondary | ICD-10-CM | POA: Diagnosis not present

## 2017-01-11 DIAGNOSIS — Z8701 Personal history of pneumonia (recurrent): Secondary | ICD-10-CM | POA: Diagnosis not present

## 2017-01-11 DIAGNOSIS — Z951 Presence of aortocoronary bypass graft: Secondary | ICD-10-CM | POA: Insufficient documentation

## 2017-01-11 DIAGNOSIS — J189 Pneumonia, unspecified organism: Secondary | ICD-10-CM | POA: Diagnosis not present

## 2017-01-17 DIAGNOSIS — L905 Scar conditions and fibrosis of skin: Secondary | ICD-10-CM | POA: Diagnosis not present

## 2017-01-17 DIAGNOSIS — C44329 Squamous cell carcinoma of skin of other parts of face: Secondary | ICD-10-CM | POA: Diagnosis not present

## 2017-01-24 DIAGNOSIS — R350 Frequency of micturition: Secondary | ICD-10-CM | POA: Diagnosis not present

## 2017-01-24 DIAGNOSIS — N400 Enlarged prostate without lower urinary tract symptoms: Secondary | ICD-10-CM | POA: Diagnosis not present

## 2017-01-24 DIAGNOSIS — R351 Nocturia: Secondary | ICD-10-CM | POA: Diagnosis not present

## 2017-01-24 DIAGNOSIS — N401 Enlarged prostate with lower urinary tract symptoms: Secondary | ICD-10-CM | POA: Diagnosis not present

## 2017-01-25 ENCOUNTER — Inpatient Hospital Stay: Payer: PPO | Attending: Internal Medicine

## 2017-01-25 DIAGNOSIS — D693 Immune thrombocytopenic purpura: Secondary | ICD-10-CM | POA: Diagnosis not present

## 2017-01-25 LAB — CBC WITH DIFFERENTIAL/PLATELET
BASOS ABS: 0.1 10*3/uL (ref 0–0.1)
BASOS PCT: 1 %
EOS ABS: 0.2 10*3/uL (ref 0–0.7)
EOS PCT: 2 %
HCT: 40.7 % (ref 40.0–52.0)
HEMOGLOBIN: 13.8 g/dL (ref 13.0–18.0)
Lymphocytes Relative: 12 %
Lymphs Abs: 1.6 10*3/uL (ref 1.0–3.6)
MCH: 28.7 pg (ref 26.0–34.0)
MCHC: 34 g/dL (ref 32.0–36.0)
MCV: 84.3 fL (ref 80.0–100.0)
Monocytes Absolute: 0.9 10*3/uL (ref 0.2–1.0)
Monocytes Relative: 6 %
NEUTROS PCT: 79 %
Neutro Abs: 11 10*3/uL — ABNORMAL HIGH (ref 1.4–6.5)
PLATELETS: 85 10*3/uL — AB (ref 150–440)
RBC: 4.82 MIL/uL (ref 4.40–5.90)
RDW: 15 % — ABNORMAL HIGH (ref 11.5–14.5)
WBC: 13.8 10*3/uL — ABNORMAL HIGH (ref 3.8–10.6)

## 2017-01-30 DIAGNOSIS — I480 Paroxysmal atrial fibrillation: Secondary | ICD-10-CM | POA: Diagnosis not present

## 2017-01-30 DIAGNOSIS — I2510 Atherosclerotic heart disease of native coronary artery without angina pectoris: Secondary | ICD-10-CM | POA: Diagnosis not present

## 2017-01-30 DIAGNOSIS — I10 Essential (primary) hypertension: Secondary | ICD-10-CM | POA: Diagnosis not present

## 2017-02-06 DIAGNOSIS — I5022 Chronic systolic (congestive) heart failure: Secondary | ICD-10-CM | POA: Diagnosis not present

## 2017-02-06 DIAGNOSIS — I700 Atherosclerosis of aorta: Secondary | ICD-10-CM | POA: Diagnosis not present

## 2017-02-06 DIAGNOSIS — E784 Other hyperlipidemia: Secondary | ICD-10-CM | POA: Diagnosis not present

## 2017-02-06 DIAGNOSIS — K219 Gastro-esophageal reflux disease without esophagitis: Secondary | ICD-10-CM | POA: Diagnosis not present

## 2017-02-06 DIAGNOSIS — D72828 Other elevated white blood cell count: Secondary | ICD-10-CM | POA: Diagnosis not present

## 2017-02-06 DIAGNOSIS — D649 Anemia, unspecified: Secondary | ICD-10-CM | POA: Diagnosis not present

## 2017-02-06 DIAGNOSIS — I2510 Atherosclerotic heart disease of native coronary artery without angina pectoris: Secondary | ICD-10-CM | POA: Diagnosis not present

## 2017-02-06 DIAGNOSIS — I10 Essential (primary) hypertension: Secondary | ICD-10-CM | POA: Diagnosis not present

## 2017-02-06 DIAGNOSIS — I480 Paroxysmal atrial fibrillation: Secondary | ICD-10-CM | POA: Diagnosis not present

## 2017-02-06 DIAGNOSIS — M1000 Idiopathic gout, unspecified site: Secondary | ICD-10-CM | POA: Diagnosis not present

## 2017-02-06 DIAGNOSIS — D696 Thrombocytopenia, unspecified: Secondary | ICD-10-CM | POA: Diagnosis not present

## 2017-02-26 ENCOUNTER — Inpatient Hospital Stay (HOSPITAL_BASED_OUTPATIENT_CLINIC_OR_DEPARTMENT_OTHER): Payer: PPO | Admitting: Internal Medicine

## 2017-02-26 ENCOUNTER — Inpatient Hospital Stay: Payer: PPO | Attending: Internal Medicine

## 2017-02-26 VITALS — BP 144/74 | HR 65 | Temp 97.0°F | Ht 71.0 in | Wt 173.9 lb

## 2017-02-26 DIAGNOSIS — Z87442 Personal history of urinary calculi: Secondary | ICD-10-CM | POA: Diagnosis not present

## 2017-02-26 DIAGNOSIS — Z86010 Personal history of colonic polyps: Secondary | ICD-10-CM

## 2017-02-26 DIAGNOSIS — Z85820 Personal history of malignant melanoma of skin: Secondary | ICD-10-CM | POA: Insufficient documentation

## 2017-02-26 DIAGNOSIS — Z79899 Other long term (current) drug therapy: Secondary | ICD-10-CM

## 2017-02-26 DIAGNOSIS — E785 Hyperlipidemia, unspecified: Secondary | ICD-10-CM | POA: Diagnosis not present

## 2017-02-26 DIAGNOSIS — Z880 Allergy status to penicillin: Secondary | ICD-10-CM

## 2017-02-26 DIAGNOSIS — D693 Immune thrombocytopenic purpura: Secondary | ICD-10-CM | POA: Diagnosis not present

## 2017-02-26 DIAGNOSIS — Z803 Family history of malignant neoplasm of breast: Secondary | ICD-10-CM | POA: Insufficient documentation

## 2017-02-26 DIAGNOSIS — I2510 Atherosclerotic heart disease of native coronary artery without angina pectoris: Secondary | ICD-10-CM

## 2017-02-26 DIAGNOSIS — K219 Gastro-esophageal reflux disease without esophagitis: Secondary | ICD-10-CM | POA: Diagnosis not present

## 2017-02-26 DIAGNOSIS — M109 Gout, unspecified: Secondary | ICD-10-CM

## 2017-02-26 DIAGNOSIS — I252 Old myocardial infarction: Secondary | ICD-10-CM

## 2017-02-26 DIAGNOSIS — D509 Iron deficiency anemia, unspecified: Secondary | ICD-10-CM

## 2017-02-26 LAB — CBC WITH DIFFERENTIAL/PLATELET
BASOS ABS: 0.1 10*3/uL (ref 0–0.1)
Basophils Relative: 1 %
EOS ABS: 0.3 10*3/uL (ref 0–0.7)
EOS PCT: 2 %
HCT: 39.4 % — ABNORMAL LOW (ref 40.0–52.0)
Hemoglobin: 13.3 g/dL (ref 13.0–18.0)
LYMPHS PCT: 12 %
Lymphs Abs: 1.5 10*3/uL (ref 1.0–3.6)
MCH: 28.9 pg (ref 26.0–34.0)
MCHC: 33.8 g/dL (ref 32.0–36.0)
MCV: 85.4 fL (ref 80.0–100.0)
MONO ABS: 0.9 10*3/uL (ref 0.2–1.0)
Monocytes Relative: 8 %
Neutro Abs: 9.2 10*3/uL — ABNORMAL HIGH (ref 1.4–6.5)
Neutrophils Relative %: 77 %
PLATELETS: 76 10*3/uL — AB (ref 150–440)
RBC: 4.61 MIL/uL (ref 4.40–5.90)
RDW: 15.1 % — AB (ref 11.5–14.5)
WBC: 11.9 10*3/uL — AB (ref 3.8–10.6)

## 2017-02-26 NOTE — Assessment & Plan Note (Signed)
#   Chronic thrombocytopenia- likely ITP. S/p Rituxan x 4 [last 08/31/2016]. platlets 76/ STABLE; no significant benefit from Rituxan.    # continue surveillance for now. Discussed multiple options- if and when the patient needs treatment for low platelets [< 50-60s]  # follow up in 4 months/cbc monthly labs.

## 2017-02-26 NOTE — Progress Notes (Signed)
Patient here for follow up no changes since last appt 

## 2017-02-27 NOTE — Progress Notes (Signed)
[name]  Patient Care Team: Levi Hector, MD as PCP - General (Internal Medicine)   SUMMARY OF ONCOLOGIC HISTORY:  2012- CHRONIC ITP-[ BMBx; 2012- hypercellular 60%; megakaryocytes/no dyspoiesis; FISH- Neg; Dr.Pandit]; [place]- Abdo-Neg for spleen/liver; HIV/hepatitis-NEG; OCT 2017- Rituxan weekly x4  # July 2017- Melanoma s/p excision [? Stage; Dr.Dasher]  INTERVAL HISTORY:  A very pleasant 82 year old Caucasian male patient with ITP suboptimal response to steroids; also status post Rituxan weekly 4 - approximately  6 m ago is here for follow-up.  Patient denies any blood in stools black stools. No nausea no vomiting. No weight loss. No skin rash. No night sweats. Denies any chest pain. His pneumonia has resolved.  REVIEW OF SYSTEMS:  A complete 10 point review of system is done which is negative except mentioned above/history of present illness.   PAST MEDICAL HISTORY :  Past Medical History:  Diagnosis Date  . CAD (coronary artery disease)   . Cancer ([name])   . Colon polyps   . GERD (gastroesophageal reflux disease)   . Gout   . Hyperlipidemia   . IDA (iron deficiency anemia)   . Kidney stones   . MI (myocardial infarction) 2004  . Mild anemia   . Thrombocytopenia ([place])     PAST SURGICAL HISTORY :   Past Surgical History:  Procedure Laterality Date  . BLADDER SURGERY    . CORONARY ARTERY BYPASS GRAFT    . INGUINAL HERNIA REPAIR Right   . LASER ABLATION     x 2 prostatic hypertrophy  . TONSILLECTOMY      FAMILY HISTORY :   Family History  Problem Relation Age of Onset  . Breast cancer Mother   . Diabetes Father   . Heart disease Father   . Heart disease Brother   . Heart disease Brother     SOCIAL HISTORY:   Social History  Substance Use Topics  . Smoking status: Never Smoker  . Smokeless tobacco: Never Used  . Alcohol use No    ALLERGIES:  is allergic to penicillins.  MEDICATIONS:  Current Outpatient  Prescriptions  Medication Sig Dispense Refill  . acetaminophen (TYLENOL) 500 MG tablet Take 500 mg by mouth every 6 (six) hours as needed.    Marland Kitchen albuterol (PROVENTIL HFA;VENTOLIN HFA) 108 (90 Base) MCG/ACT inhaler Inhale 2 puffs into the lungs every 6 (six) hours as needed for wheezing or shortness of breath. 1 Inhaler 0  . atorvastatin (LIPITOR) 10 MG tablet Take 10 mg by mouth daily. 1/2 tablet    . Cyanocobalamin (VITAMIN B 12 PO) Take 1 Dose by mouth daily.    Marland Kitchen diltiazem (DILACOR XR) 240 MG 24 hr capsule Take 240 mg by mouth daily.     Marland Kitchen loratadine (CLARITIN) 10 MG tablet Take 10 mg by mouth daily as needed for allergies.    . metoprolol succinate (TOPROL-XL) 25 MG 24 hr tablet Take 25 mg by mouth 2 (two) times daily.    . Multiple Vitamins-Minerals (MULTIVITAMIN WITH MINERALS) tablet Take 1 tablet by mouth daily.    . multivitamin-lutein (OCUVITE-LUTEIN) CAPS capsule Take 2 capsules by mouth daily.    . Omega-3 Fatty Acids (FISH OIL) 1000 MG CAPS Take 1 capsule by mouth daily.    Marland Kitchen omeprazole (PRILOSEC) 20 MG capsule Take 20 mg by mouth daily.     No current facility-administered medications for this visit.     PHYSICAL EXAMINATION:   BP (!) 144/74 (BP Location: Left Arm,  Patient Position: Sitting)   Pulse 65   Temp 97 F (36.1 C) (Tympanic)   Ht 5\' 11"  (1.803 m)   Wt 173 lb 14.4 oz (78.9 kg)   BMI 24.25 kg/m   Filed Weights   02/26/17 1051  Weight: 173 lb 14.4 oz (78.9 kg)    GENERAL: Well-nourished well-developed; Alert, no distress and comfortable.  Accompanied by his wife. EYES: no pallor or icterus OROPHARYNX: no thrush or ulceration;  NECK: supple, no masses felt LYMPH:  no palpable lymphadenopathy in the cervical, axillary or inguinal regions LUNGS: clear to auscultation and  No wheeze or crackles HEART/CVS: regular rate & rhythm and no murmurs; No lower extremity edema ABDOMEN:abdomen soft, non-tender and normal bowel sounds Musculoskeletal:no cyanosis of digits  and no clubbing  PSYCH: alert & oriented x 3 with fluent speech NEURO: no focal motor/sensory deficits SKIN:  no rashes or significant lesions; multiple chronic ecchymosis  LABORATORY DATA:  I have reviewed the data as listed    Component Value Date/Time   NA 142 12/28/2016 1010   NA 140 12/17/2012 1229   K 3.8 12/28/2016 1010   K 4.4 12/17/2012 1229   CL 109 12/28/2016 1010   CL 106 12/17/2012 1229   CO2 27 12/28/2016 1010   CO2 26 12/17/2012 1229   GLUCOSE 110 (H) 12/28/2016 1010   GLUCOSE 77 12/17/2012 1229   BUN 19 12/28/2016 1010   BUN 19 (H) 12/17/2012 1229   CREATININE 0.92 12/28/2016 1010   CREATININE 1.05 12/17/2012 1229   CALCIUM 9.0 12/28/2016 1010   CALCIUM 8.4 (L) 12/17/2012 1229   PROT 7.5 06/29/2016 0855   ALBUMIN 4.1 06/29/2016 0855   AST 19 06/29/2016 0855   ALT 17 06/29/2016 0855   ALKPHOS 55 06/29/2016 0855   BILITOT 0.6 06/29/2016 0855   GFRNONAA >60 12/28/2016 1010   GFRNONAA >60 12/17/2012 1229   GFRAA >60 12/28/2016 1010   GFRAA >60 12/17/2012 1229    No results found for: SPEP, UPEP  Lab Results  Component Value Date   WBC 11.9 (H) 02/26/2017   NEUTROABS 9.2 (H) 02/26/2017   HGB 13.3 02/26/2017   HCT 39.4 (L) 02/26/2017   MCV 85.4 02/26/2017   PLT 76 (L) 02/26/2017      Chemistry      Component Value Date/Time   NA 142 12/28/2016 1010   NA 140 12/17/2012 1229   K 3.8 12/28/2016 1010   K 4.4 12/17/2012 1229   CL 109 12/28/2016 1010   CL 106 12/17/2012 1229   CO2 27 12/28/2016 1010   CO2 26 12/17/2012 1229   BUN 19 12/28/2016 1010   BUN 19 (H) 12/17/2012 1229   CREATININE 0.92 12/28/2016 1010   CREATININE 1.05 12/17/2012 1229      Component Value Date/Time   CALCIUM 9.0 12/28/2016 1010   CALCIUM 8.4 (L) 12/17/2012 1229   ALKPHOS 55 06/29/2016 0855   AST 19 06/29/2016 0855   ALT 17 06/29/2016 0855   BILITOT 0.6 06/29/2016 0855       RADIOGRAPHIC STUDIES: I have personally reviewed the radiological images as listed and  agreed with the findings in the report. No results found.   ASSESSMENT & PLAN:   Idiopathic thrombocytopenic purpura ([place]) # Chronic thrombocytopenia- likely ITP. S/p Rituxan x 4 [last 08/31/2016]. platlets 76/ STABLE; no significant benefit from Rituxan.    # continue surveillance for now. Discussed multiple options- if and when the patient needs treatment for low platelets [< 50-60s]  #  follow up in 4 months/cbc monthly labs.      Cammie Sickle, MD 02/27/2017 1:26 PM

## 2017-03-28 ENCOUNTER — Inpatient Hospital Stay: Payer: PPO | Attending: Internal Medicine

## 2017-03-28 DIAGNOSIS — D693 Immune thrombocytopenic purpura: Secondary | ICD-10-CM | POA: Insufficient documentation

## 2017-03-28 DIAGNOSIS — D509 Iron deficiency anemia, unspecified: Secondary | ICD-10-CM | POA: Insufficient documentation

## 2017-03-28 LAB — CBC WITH DIFFERENTIAL/PLATELET
BASOS ABS: 0.1 10*3/uL (ref 0–0.1)
BASOS PCT: 1 %
Eosinophils Absolute: 0.3 10*3/uL (ref 0–0.7)
Eosinophils Relative: 2 %
HEMATOCRIT: 42.5 % (ref 40.0–52.0)
Hemoglobin: 14.4 g/dL (ref 13.0–18.0)
LYMPHS PCT: 13 %
Lymphs Abs: 1.7 10*3/uL (ref 1.0–3.6)
MCH: 29.1 pg (ref 26.0–34.0)
MCHC: 33.8 g/dL (ref 32.0–36.0)
MCV: 86.1 fL (ref 80.0–100.0)
MONO ABS: 1 10*3/uL (ref 0.2–1.0)
Monocytes Relative: 7 %
NEUTROS ABS: 10 10*3/uL — AB (ref 1.4–6.5)
Neutrophils Relative %: 77 %
PLATELETS: 72 10*3/uL — AB (ref 150–440)
RBC: 4.94 MIL/uL (ref 4.40–5.90)
RDW: 14.8 % — AB (ref 11.5–14.5)
WBC: 13 10*3/uL — ABNORMAL HIGH (ref 3.8–10.6)

## 2017-03-29 ENCOUNTER — Telehealth: Payer: Self-pay | Admitting: *Deleted

## 2017-03-29 NOTE — Telephone Encounter (Signed)
Spoke with patient. Patient gave verbal understanding of lab results and plan of care. He has an apt for lab only next month in June and lab/md apt in July. He was instructed to keep these appointments.   Notes recorded by Cammie Sickle, MD on 03/29/2017 at 12:13 PM EDT Please inform patient that platelets are fairly stable at [place] continue to monitor on a monthly basis; no new recommendations at this time.

## 2017-04-03 IMAGING — CR DG CHEST 2V
2 series · 2 of 2 positions shown · non-contrast
Comparison: 09/05/2016

CLINICAL DATA: Shortness of breath, pneumonia

EXAM:
CHEST  2 VIEW

[chest pa]
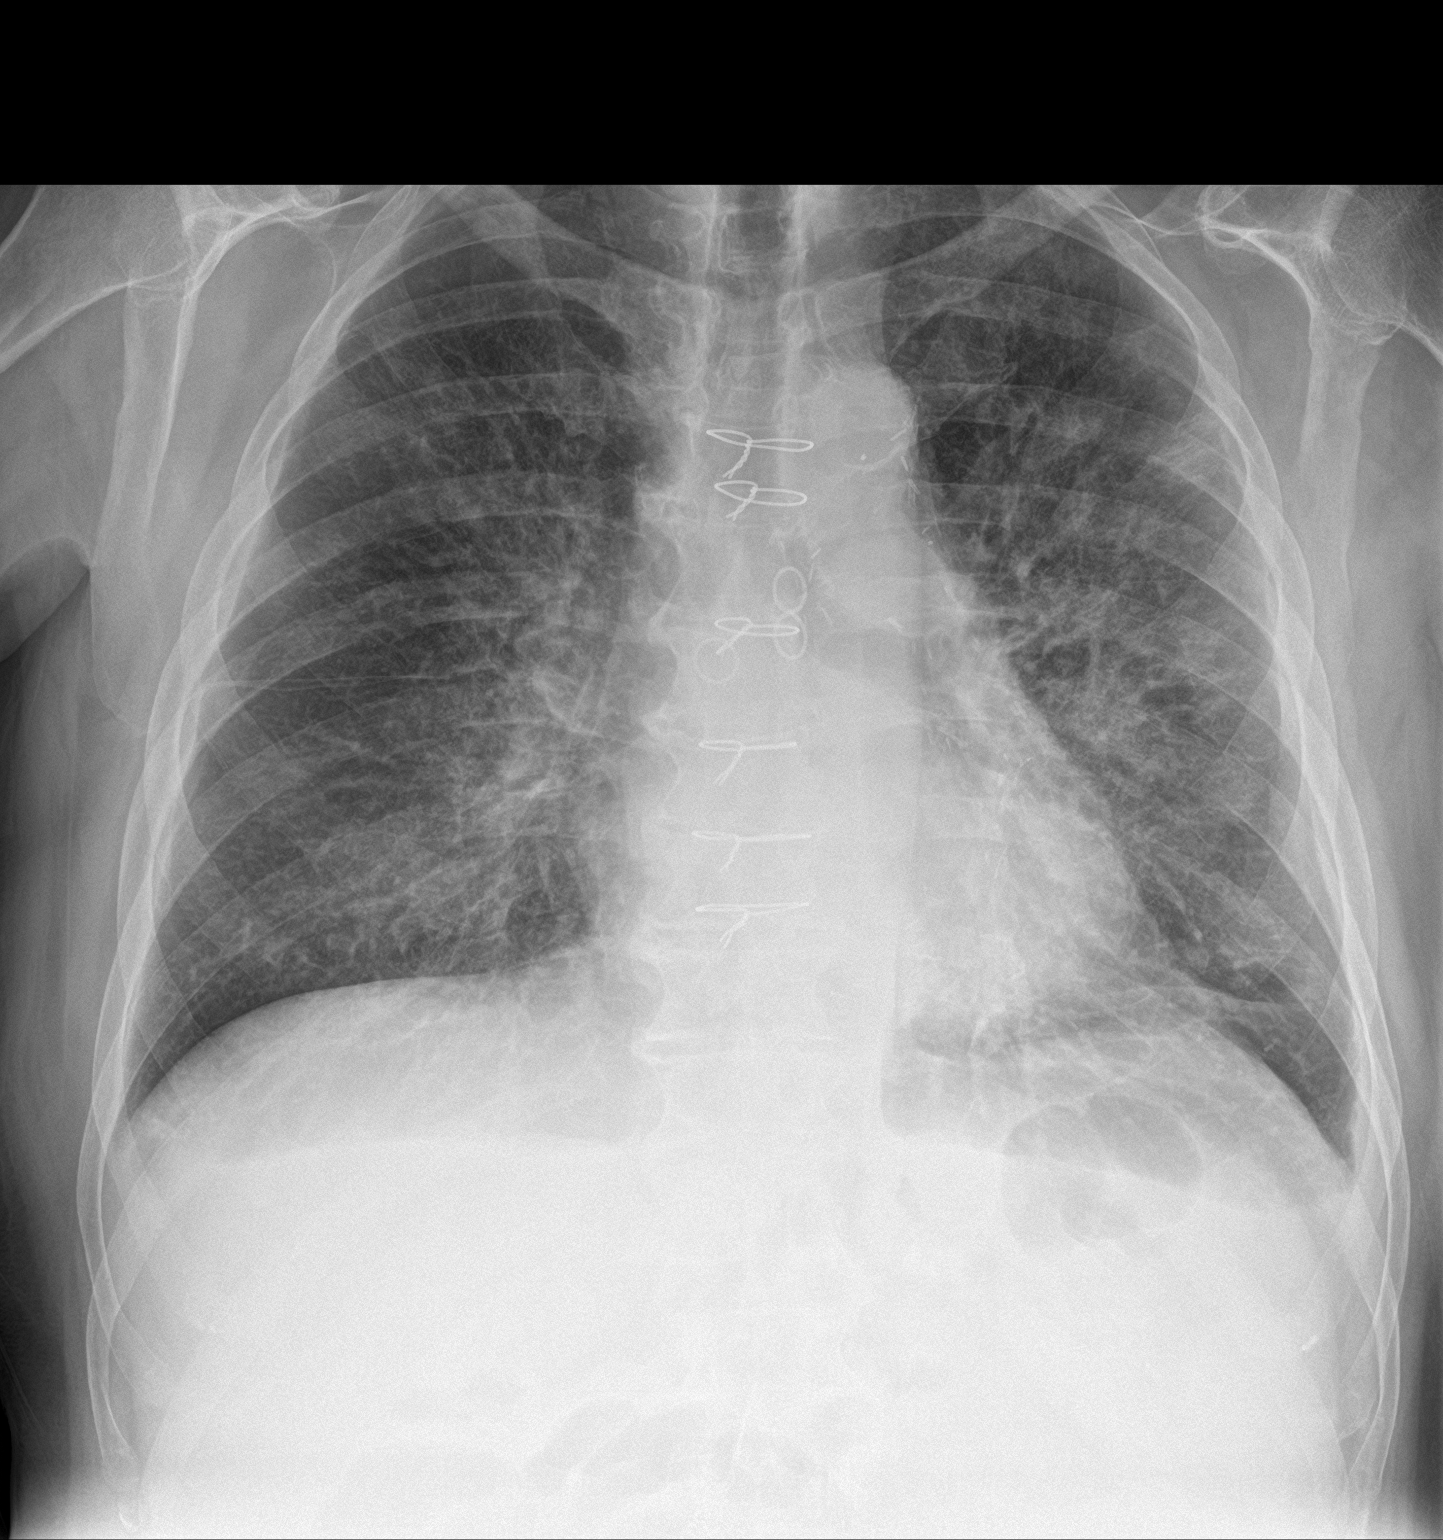

[chest lat]
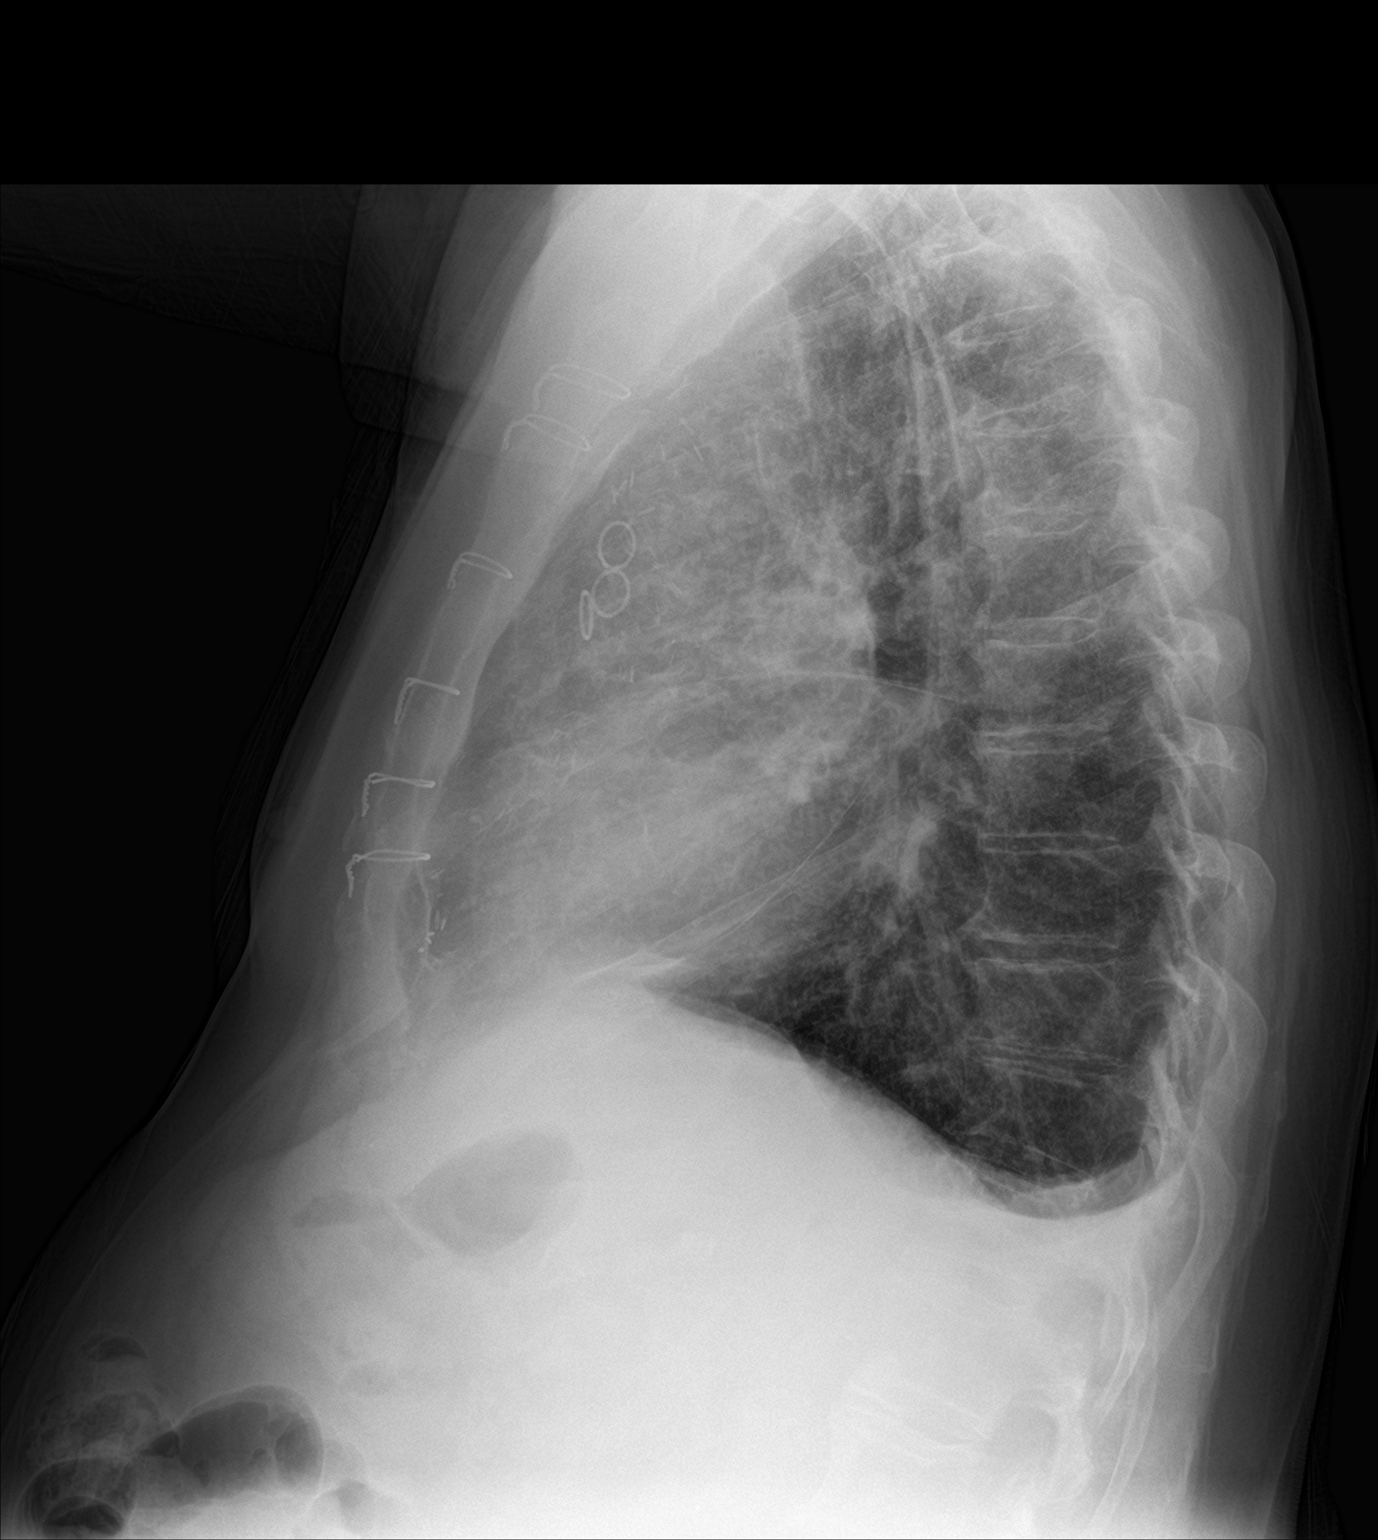

[2 of 2 positions shown; findings below may reference images not displayed]

FINDINGS: Cardiomediastinal silhouette is stable. Bilateral perihilar
bronchitic changes and interstitial prominence again noted highly
suspicious for pneumonitis. Follow-up to resolution after
appropriate treatment is recommended. Status post CABG. Small
bilateral pleural effusion. No convincing pulmonary edema.
IMPRESSION: Bilateral perihilar bronchitic changes and interstitial prominence
again noted highly suspicious for pneumonitis. Follow-up to
resolution after appropriate treatment is recommended. Status post
CABG. Small bilateral pleural effusion. No convincing pulmonary
edema.

## 2017-04-16 DIAGNOSIS — Z85820 Personal history of malignant melanoma of skin: Secondary | ICD-10-CM | POA: Diagnosis not present

## 2017-04-16 DIAGNOSIS — D2272 Melanocytic nevi of left lower limb, including hip: Secondary | ICD-10-CM | POA: Diagnosis not present

## 2017-04-16 DIAGNOSIS — L570 Actinic keratosis: Secondary | ICD-10-CM | POA: Diagnosis not present

## 2017-04-16 DIAGNOSIS — D225 Melanocytic nevi of trunk: Secondary | ICD-10-CM | POA: Diagnosis not present

## 2017-04-16 DIAGNOSIS — X32XXXA Exposure to sunlight, initial encounter: Secondary | ICD-10-CM | POA: Diagnosis not present

## 2017-04-16 DIAGNOSIS — Z85828 Personal history of other malignant neoplasm of skin: Secondary | ICD-10-CM | POA: Diagnosis not present

## 2017-04-30 ENCOUNTER — Inpatient Hospital Stay: Payer: PPO | Attending: Internal Medicine

## 2017-04-30 DIAGNOSIS — D693 Immune thrombocytopenic purpura: Secondary | ICD-10-CM | POA: Insufficient documentation

## 2017-04-30 DIAGNOSIS — D509 Iron deficiency anemia, unspecified: Secondary | ICD-10-CM | POA: Diagnosis not present

## 2017-04-30 LAB — CBC WITH DIFFERENTIAL/PLATELET
Basophils Absolute: 0.1 10*3/uL (ref 0–0.1)
Basophils Relative: 1 %
Eosinophils Absolute: 0.2 10*3/uL (ref 0–0.7)
Eosinophils Relative: 2 %
HCT: 42.2 % (ref 40.0–52.0)
Hemoglobin: 14.5 g/dL (ref 13.0–18.0)
Lymphocytes Relative: 12 %
Lymphs Abs: 1.5 10*3/uL (ref 1.0–3.6)
MCH: 29.5 pg (ref 26.0–34.0)
MCHC: 34.3 g/dL (ref 32.0–36.0)
MCV: 85.9 fL (ref 80.0–100.0)
Monocytes Absolute: 1 10*3/uL (ref 0.2–1.0)
Monocytes Relative: 8 %
Neutro Abs: 9.9 10*3/uL — ABNORMAL HIGH (ref 1.4–6.5)
Neutrophils Relative %: 77 %
Platelets: 77 10*3/uL — ABNORMAL LOW (ref 150–440)
RBC: 4.92 MIL/uL (ref 4.40–5.90)
RDW: 14.4 % (ref 11.5–14.5)
WBC: 12.7 10*3/uL — ABNORMAL HIGH (ref 3.8–10.6)

## 2017-05-03 ENCOUNTER — Telehealth: Payer: Self-pay | Admitting: *Deleted

## 2017-05-03 NOTE — Telephone Encounter (Signed)
Contacted patient. Pt's wife informed - cbc - Labs drawn 3 days ago stable - continue with Dr. Sharmaine Base plan of care- pt scheduled for labs 7/2- Nira Conn, RN BSN 05/04/17.

## 2017-05-28 ENCOUNTER — Inpatient Hospital Stay: Payer: PPO | Attending: Internal Medicine

## 2017-05-28 ENCOUNTER — Inpatient Hospital Stay (HOSPITAL_BASED_OUTPATIENT_CLINIC_OR_DEPARTMENT_OTHER): Payer: PPO | Admitting: Internal Medicine

## 2017-05-28 VITALS — BP 147/72 | HR 60 | Temp 97.8°F | Resp 18 | Ht 71.0 in | Wt 174.0 lb

## 2017-05-28 DIAGNOSIS — D509 Iron deficiency anemia, unspecified: Secondary | ICD-10-CM

## 2017-05-28 DIAGNOSIS — I252 Old myocardial infarction: Secondary | ICD-10-CM | POA: Insufficient documentation

## 2017-05-28 DIAGNOSIS — Z7982 Long term (current) use of aspirin: Secondary | ICD-10-CM

## 2017-05-28 DIAGNOSIS — Z87442 Personal history of urinary calculi: Secondary | ICD-10-CM | POA: Diagnosis not present

## 2017-05-28 DIAGNOSIS — E785 Hyperlipidemia, unspecified: Secondary | ICD-10-CM | POA: Insufficient documentation

## 2017-05-28 DIAGNOSIS — Z79899 Other long term (current) drug therapy: Secondary | ICD-10-CM | POA: Diagnosis not present

## 2017-05-28 DIAGNOSIS — M109 Gout, unspecified: Secondary | ICD-10-CM | POA: Diagnosis not present

## 2017-05-28 DIAGNOSIS — Z9225 Personal history of immunosupression therapy: Secondary | ICD-10-CM | POA: Insufficient documentation

## 2017-05-28 DIAGNOSIS — Z86010 Personal history of colonic polyps: Secondary | ICD-10-CM | POA: Diagnosis not present

## 2017-05-28 DIAGNOSIS — Z803 Family history of malignant neoplasm of breast: Secondary | ICD-10-CM

## 2017-05-28 DIAGNOSIS — D693 Immune thrombocytopenic purpura: Secondary | ICD-10-CM | POA: Insufficient documentation

## 2017-05-28 DIAGNOSIS — Z880 Allergy status to penicillin: Secondary | ICD-10-CM

## 2017-05-28 DIAGNOSIS — Z85820 Personal history of malignant melanoma of skin: Secondary | ICD-10-CM | POA: Insufficient documentation

## 2017-05-28 DIAGNOSIS — I2510 Atherosclerotic heart disease of native coronary artery without angina pectoris: Secondary | ICD-10-CM | POA: Insufficient documentation

## 2017-05-28 DIAGNOSIS — K219 Gastro-esophageal reflux disease without esophagitis: Secondary | ICD-10-CM | POA: Diagnosis not present

## 2017-05-28 LAB — CBC WITH DIFFERENTIAL/PLATELET
BASOS ABS: 0.1 10*3/uL (ref 0–0.1)
BASOS PCT: 1 %
EOS ABS: 0.2 10*3/uL (ref 0–0.7)
EOS PCT: 2 %
HEMATOCRIT: 41.5 % (ref 40.0–52.0)
Hemoglobin: 14 g/dL (ref 13.0–18.0)
Lymphocytes Relative: 11 %
Lymphs Abs: 1.2 10*3/uL (ref 1.0–3.6)
MCH: 29 pg (ref 26.0–34.0)
MCHC: 33.6 g/dL (ref 32.0–36.0)
MCV: 86.2 fL (ref 80.0–100.0)
MONO ABS: 0.8 10*3/uL (ref 0.2–1.0)
MONOS PCT: 7 %
Neutro Abs: 9.2 10*3/uL — ABNORMAL HIGH (ref 1.4–6.5)
Neutrophils Relative %: 81 %
Platelets: 75 10*3/uL — ABNORMAL LOW (ref 150–440)
RBC: 4.82 MIL/uL (ref 4.40–5.90)
RDW: 14.4 % (ref 11.5–14.5)
WBC: 11.5 10*3/uL — ABNORMAL HIGH (ref 3.8–10.6)

## 2017-05-28 NOTE — Progress Notes (Signed)
[place] OFFICE PROGRESS NOTE  Patient Care Team: [name], MD as PCP - General (Internal Medicine)   SUMMARY OF ONCOLOGIC HISTORY:  2012- CHRONIC ITP-[ BMBx; 2012- hypercellular 60%; megakaryocytes/no dyspoiesis; FISH- Neg; Dr.Pandit]; [place]- Abdo-Neg for spleen/liver; HIV/hepatitis-NEG; OCT 2017- Rituxan weekly x4  # July 2017- Melanoma s/p excision [? Stage; Dr.Dasher]  INTERVAL HISTORY:  A very pleasant 82 year old Caucasian male patient with ITP suboptimal response to steroids/ post Rituxan weekly 30 August 2016 is here for follow-up.  Patient denies any blood in stools black stools. No nausea no vomiting. No weight loss. No skin rash. No night sweats. Denies any chest pain. Does admit to easy bruising. He is not on any anticoagulants except for aspirin.  REVIEW OF SYSTEMS:  A complete 10 point review of system is done which is negative except mentioned above/history of present illness.   PAST MEDICAL HISTORY :  Past Medical History:  Diagnosis Date  . CAD (coronary artery disease)   . Cancer ([place])   . Colon polyps   . GERD (gastroesophageal reflux disease)   . Gout   . Hyperlipidemia   . IDA (iron deficiency anemia)   . Kidney stones   . MI (myocardial infarction) 2004  . Mild anemia   . Thrombocytopenia ([place])     PAST SURGICAL HISTORY :   Past Surgical History:  Procedure Laterality Date  . BLADDER SURGERY    . CORONARY ARTERY BYPASS GRAFT    . INGUINAL HERNIA REPAIR Right   . LASER ABLATION     x 2 prostatic hypertrophy  . TONSILLECTOMY      FAMILY HISTORY :   Family History  Problem Relation Age of Onset  . Breast cancer Mother   . Diabetes Father   . Heart disease Father   . Heart disease Brother   . Heart disease Brother     SOCIAL HISTORY:   Social History  Substance Use Topics  . Smoking status: Never Smoker  . Smokeless tobacco: Never Used  . Alcohol use No    ALLERGIES:  is allergic to  penicillins.  MEDICATIONS:  Current Outpatient Prescriptions  Medication Sig Dispense Refill  . atorvastatin (LIPITOR) 10 MG tablet Take 5 mg by mouth daily.     . Cyanocobalamin (VITAMIN B 12 PO) Take 1 Dose by mouth daily.    Marland Kitchen diltiazem (DILACOR XR) 240 MG 24 hr capsule Take 240 mg by mouth daily.     . metoprolol succinate (TOPROL-XL) 25 MG 24 hr tablet Take 25 mg by mouth 2 (two) times daily.    . Multiple Vitamins-Minerals (MULTIVITAMIN WITH MINERALS) tablet Take 1 tablet by mouth daily.    . multivitamin-lutein (OCUVITE-LUTEIN) CAPS capsule Take 2 capsules by mouth daily.    . Omega-3 Fatty Acids (FISH OIL) 1000 MG CAPS Take 1 capsule by mouth daily.    Marland Kitchen omeprazole (PRILOSEC) 20 MG capsule Take 20 mg by mouth daily.    Marland Kitchen acetaminophen (TYLENOL) 500 MG tablet Take 500 mg by mouth every 6 (six) hours as needed.    Marland Kitchen albuterol (PROVENTIL HFA;VENTOLIN HFA) 108 (90 Base) MCG/ACT inhaler Inhale 2 puffs into the lungs every 6 (six) hours as needed for wheezing or shortness of breath. (Patient not taking: Reported on 05/28/2017) 1 Inhaler 0  . loratadine (CLARITIN) 10 MG tablet Take 10 mg by mouth daily as needed for allergies.     No current facility-administered medications for this visit.     PHYSICAL EXAMINATION:  BP (!) 147/72 (BP Location: Right Arm, Patient Position: Sitting)   Pulse 60   Temp 97.8 F (36.6 C) (Tympanic)   Resp 18   Ht 5\' 11"  (1.803 m)   Wt 174 lb (78.9 kg)   BMI 24.27 kg/m   Filed Weights   05/28/17 0952  Weight: 174 lb (78.9 kg)    GENERAL: Well-nourished well-developed; Alert, no distress and comfortable.  Accompanied by his wife. EYES: no pallor or icterus OROPHARYNX: no thrush or ulceration;  NECK: supple, no masses felt LYMPH:  no palpable lymphadenopathy in the cervical, axillary or inguinal regions LUNGS: clear to auscultation and  No wheeze or crackles HEART/CVS: regular rate & rhythm and no murmurs; No lower extremity edema ABDOMEN:abdomen  soft, non-tender and normal bowel sounds Musculoskeletal:no cyanosis of digits and no clubbing  PSYCH: alert & oriented x 3 with fluent speech NEURO: no focal motor/sensory deficits SKIN:  no rashes or significant lesions; multiple chronic ecchymosis  LABORATORY DATA:  I have reviewed the data as listed    Component Value Date/Time   NA 142 12/28/2016 1010   NA 140 12/17/2012 1229   K 3.8 12/28/2016 1010   K 4.4 12/17/2012 1229   CL 109 12/28/2016 1010   CL 106 12/17/2012 1229   CO2 27 12/28/2016 1010   CO2 26 12/17/2012 1229   GLUCOSE 110 (H) 12/28/2016 1010   GLUCOSE 77 12/17/2012 1229   BUN 19 12/28/2016 1010   BUN 19 (H) 12/17/2012 1229   CREATININE 0.92 12/28/2016 1010   CREATININE 1.05 12/17/2012 1229   CALCIUM 9.0 12/28/2016 1010   CALCIUM 8.4 (L) 12/17/2012 1229   PROT 7.5 06/29/2016 0855   ALBUMIN 4.1 06/29/2016 0855   AST 19 06/29/2016 0855   ALT 17 06/29/2016 0855   ALKPHOS 55 06/29/2016 0855   BILITOT 0.6 06/29/2016 0855   GFRNONAA >60 12/28/2016 1010   GFRNONAA >60 12/17/2012 1229   GFRAA >60 12/28/2016 1010   GFRAA >60 12/17/2012 1229    No results found for: SPEP, UPEP  Lab Results  Component Value Date   WBC 11.5 (H) 05/28/2017   NEUTROABS 9.2 (H) 05/28/2017   HGB 14.0 05/28/2017   HCT 41.5 05/28/2017   MCV 86.2 05/28/2017   PLT 75 (L) 05/28/2017      Chemistry      Component Value Date/Time   NA 142 12/28/2016 1010   NA 140 12/17/2012 1229   K 3.8 12/28/2016 1010   K 4.4 12/17/2012 1229   CL 109 12/28/2016 1010   CL 106 12/17/2012 1229   CO2 27 12/28/2016 1010   CO2 26 12/17/2012 1229   BUN 19 12/28/2016 1010   BUN 19 (H) 12/17/2012 1229   CREATININE 0.92 12/28/2016 1010   CREATININE 1.05 12/17/2012 1229      Component Value Date/Time   CALCIUM 9.0 12/28/2016 1010   CALCIUM 8.4 (L) 12/17/2012 1229   ALKPHOS 55 06/29/2016 0855   AST 19 06/29/2016 0855   ALT 17 06/29/2016 0855   BILITOT 0.6 06/29/2016 0855       RADIOGRAPHIC  STUDIES: I have personally reviewed the radiological images as listed and agreed with the findings in the report. No results found.   ASSESSMENT & PLAN:   Idiopathic thrombocytopenic purpura ([place]) # Chronic thrombocytopenia- likely ITP. S/p Rituxan x 4 [last 08/31/2016]. platlets 74/ STABLE; no significant benefit from Rituxan.   # continue surveillance for now. Discussed multiple options- if and when the patient needs treatment for low  platelets [< 50-60s]  # follow up in 4 months/cbc q 2 monthly labs.      Cammie Sickle, MD 05/28/2017 5:25 PM

## 2017-05-28 NOTE — Assessment & Plan Note (Addendum)
#   Chronic thrombocytopenia- likely ITP. S/p Rituxan x 4 [last 08/31/2016]. platlets 74/ STABLE; no significant benefit from Rituxan.   # continue surveillance for now. Discussed multiple options- if and when the patient needs treatment for low platelets [< 50-60s]  # follow up in 4 months/cbc q 2 monthly labs.

## 2017-05-28 NOTE — Progress Notes (Signed)
Patient presents to clinic to follow-up for h/o of ITP. He has no medical complaints today.

## 2017-07-31 ENCOUNTER — Inpatient Hospital Stay: Payer: PPO | Attending: Internal Medicine

## 2017-07-31 DIAGNOSIS — D509 Iron deficiency anemia, unspecified: Secondary | ICD-10-CM | POA: Diagnosis not present

## 2017-07-31 DIAGNOSIS — D693 Immune thrombocytopenic purpura: Secondary | ICD-10-CM | POA: Diagnosis not present

## 2017-07-31 LAB — CBC WITH DIFFERENTIAL/PLATELET
Basophils Absolute: 0.1 10*3/uL (ref 0–0.1)
Basophils Relative: 1 %
EOS ABS: 0.2 10*3/uL (ref 0–0.7)
EOS PCT: 2 %
HCT: 41.5 % (ref 40.0–52.0)
Hemoglobin: 14.1 g/dL (ref 13.0–18.0)
LYMPHS ABS: 1.4 10*3/uL (ref 1.0–3.6)
Lymphocytes Relative: 12 %
MCH: 29.8 pg (ref 26.0–34.0)
MCHC: 34 g/dL (ref 32.0–36.0)
MCV: 87.5 fL (ref 80.0–100.0)
MONO ABS: 0.9 10*3/uL (ref 0.2–1.0)
MONOS PCT: 8 %
Neutro Abs: 8.9 10*3/uL — ABNORMAL HIGH (ref 1.4–6.5)
Neutrophils Relative %: 77 %
PLATELETS: 73 10*3/uL — AB (ref 150–440)
RBC: 4.74 MIL/uL (ref 4.40–5.90)
RDW: 14.6 % — ABNORMAL HIGH (ref 11.5–14.5)
WBC: 11.4 10*3/uL — ABNORMAL HIGH (ref 3.8–10.6)

## 2017-08-01 ENCOUNTER — Telehealth: Payer: Self-pay | Admitting: *Deleted

## 2017-08-01 NOTE — Telephone Encounter (Signed)
-----   Message from Cammie Sickle, MD sent at 07/31/2017 11:59 AM EDT ----- Please inform patient that his platelets are stable at 73; in no new recommendations follow-up/labs as planned.

## 2017-08-01 NOTE — Telephone Encounter (Signed)
Spoke with patient-made aware of plt counts results and to keep f/u as planned.

## 2017-08-02 DIAGNOSIS — M1000 Idiopathic gout, unspecified site: Secondary | ICD-10-CM | POA: Diagnosis not present

## 2017-08-02 DIAGNOSIS — I10 Essential (primary) hypertension: Secondary | ICD-10-CM | POA: Diagnosis not present

## 2017-08-02 DIAGNOSIS — D696 Thrombocytopenia, unspecified: Secondary | ICD-10-CM | POA: Diagnosis not present

## 2017-08-09 ENCOUNTER — Other Ambulatory Visit: Payer: Self-pay | Admitting: Internal Medicine

## 2017-08-09 DIAGNOSIS — I10 Essential (primary) hypertension: Secondary | ICD-10-CM | POA: Diagnosis not present

## 2017-08-09 DIAGNOSIS — I5022 Chronic systolic (congestive) heart failure: Secondary | ICD-10-CM | POA: Diagnosis not present

## 2017-08-09 DIAGNOSIS — I2510 Atherosclerotic heart disease of native coronary artery without angina pectoris: Secondary | ICD-10-CM | POA: Diagnosis not present

## 2017-08-09 DIAGNOSIS — R310 Gross hematuria: Secondary | ICD-10-CM

## 2017-08-09 DIAGNOSIS — Z0000 Encounter for general adult medical examination without abnormal findings: Secondary | ICD-10-CM | POA: Diagnosis not present

## 2017-08-09 DIAGNOSIS — I700 Atherosclerosis of aorta: Secondary | ICD-10-CM | POA: Diagnosis not present

## 2017-08-09 DIAGNOSIS — I480 Paroxysmal atrial fibrillation: Secondary | ICD-10-CM | POA: Diagnosis not present

## 2017-08-09 DIAGNOSIS — K219 Gastro-esophageal reflux disease without esophagitis: Secondary | ICD-10-CM | POA: Diagnosis not present

## 2017-08-09 DIAGNOSIS — M1000 Idiopathic gout, unspecified site: Secondary | ICD-10-CM | POA: Diagnosis not present

## 2017-08-09 DIAGNOSIS — D692 Other nonthrombocytopenic purpura: Secondary | ICD-10-CM | POA: Insufficient documentation

## 2017-08-09 DIAGNOSIS — E784 Other hyperlipidemia: Secondary | ICD-10-CM | POA: Diagnosis not present

## 2017-08-09 DIAGNOSIS — D649 Anemia, unspecified: Secondary | ICD-10-CM | POA: Diagnosis not present

## 2017-08-09 DIAGNOSIS — D696 Thrombocytopenia, unspecified: Secondary | ICD-10-CM | POA: Diagnosis not present

## 2017-08-15 ENCOUNTER — Ambulatory Visit
Admission: RE | Admit: 2017-08-15 | Discharge: 2017-08-15 | Disposition: A | Discharge status: Home / Self Care | Payer: PPO | Source: Ambulatory Visit | Attending: Internal Medicine | Admitting: Internal Medicine

## 2017-08-15 DIAGNOSIS — K8020 Calculus of gallbladder without cholecystitis without obstruction: Secondary | ICD-10-CM | POA: Insufficient documentation

## 2017-08-15 DIAGNOSIS — N289 Disorder of kidney and ureter, unspecified: Secondary | ICD-10-CM | POA: Insufficient documentation

## 2017-08-15 DIAGNOSIS — N281 Cyst of kidney, acquired: Secondary | ICD-10-CM | POA: Insufficient documentation

## 2017-08-15 DIAGNOSIS — I2510 Atherosclerotic heart disease of native coronary artery without angina pectoris: Secondary | ICD-10-CM | POA: Diagnosis not present

## 2017-08-15 DIAGNOSIS — N210 Calculus in bladder: Secondary | ICD-10-CM | POA: Diagnosis not present

## 2017-08-15 DIAGNOSIS — K439 Ventral hernia without obstruction or gangrene: Secondary | ICD-10-CM | POA: Diagnosis not present

## 2017-08-15 DIAGNOSIS — R310 Gross hematuria: Secondary | ICD-10-CM | POA: Diagnosis present

## 2017-08-15 DIAGNOSIS — N420 Calculus of prostate: Secondary | ICD-10-CM | POA: Diagnosis not present

## 2017-08-15 DIAGNOSIS — R918 Other nonspecific abnormal finding of lung field: Secondary | ICD-10-CM | POA: Diagnosis not present

## 2017-08-15 DIAGNOSIS — I700 Atherosclerosis of aorta: Secondary | ICD-10-CM | POA: Insufficient documentation

## 2017-08-16 DIAGNOSIS — N2889 Other specified disorders of kidney and ureter: Secondary | ICD-10-CM | POA: Insufficient documentation

## 2017-08-16 DIAGNOSIS — R911 Solitary pulmonary nodule: Secondary | ICD-10-CM | POA: Insufficient documentation

## 2017-08-16 DIAGNOSIS — N210 Calculus in bladder: Secondary | ICD-10-CM | POA: Insufficient documentation

## 2017-09-04 ENCOUNTER — Encounter: Payer: Self-pay | Admitting: Urology

## 2017-09-04 ENCOUNTER — Ambulatory Visit (INDEPENDENT_AMBULATORY_CARE_PROVIDER_SITE_OTHER): Payer: PPO | Admitting: Urology

## 2017-09-04 VITALS — BP 151/71 | HR 74 | Ht 71.0 in | Wt 174.9 lb

## 2017-09-04 DIAGNOSIS — N210 Calculus in bladder: Secondary | ICD-10-CM | POA: Diagnosis not present

## 2017-09-04 DIAGNOSIS — N2889 Other specified disorders of kidney and ureter: Secondary | ICD-10-CM

## 2017-09-04 LAB — URINALYSIS, COMPLETE
BILIRUBIN UA: NEGATIVE
Glucose, UA: NEGATIVE
KETONES UA: NEGATIVE
NITRITE UA: NEGATIVE
PH UA: 5.5 (ref 5.0–7.5)
RBC UA: NEGATIVE
Specific Gravity, UA: 1.02 (ref 1.005–1.030)
Urobilinogen, Ur: 0.2 mg/dL (ref 0.2–1.0)

## 2017-09-04 LAB — MICROSCOPIC EXAMINATION: RBC MICROSCOPIC, UA: NONE SEEN /HPF (ref 0–?)

## 2017-09-04 NOTE — Progress Notes (Signed)
09/04/2017 1:40 PM   PAT Levi Figueroa [birth year] [id number]  Referring provider: [name], MD [place] [facility] [place], [place] [address]  Chief Complaint  Patient presents with  . New Patient (Initial Visit)    Bladder stone    HPI: 82 year old man with an extensive history of bladder outlet obstruction who has had several laser prostate procedures by Dr. Eliberto Ivory in the past 15 years. His last procedure was in 2014. He presents today for further management and evaluation of a large bladder stone, this was seen on his CAT scan that was performed for gross hematuria one month prior.  The patient states that when he rides his lawnmower for more than an hour and a half he has some discolored urine. He denies dysuria. He denies any worsening frequency or urgency. He denies any suprapubic pain. He does endorse some bladder leakage first thing in the morning, but otherwise has no incontinence. He does not have any urinary frequency. He gets up once a night to void.  In addition to the bladder stone, there is a hyperdense nodule, 4 mm, and the left lower pole that was otherwise uncharacterized on the noncontrast CT scan.     PMH: Past Medical History:  Diagnosis Date  . CAD (coronary artery disease)   . Cancer ([place])   . Colon polyps   . GERD (gastroesophageal reflux disease)   . Gout   . Hyperlipidemia   . IDA (iron deficiency anemia)   . Kidney stones   . MI (myocardial infarction) ([place]) 2004  . Mild anemia   . Thrombocytopenia [facility])     Surgical History: Past Surgical History:  Procedure Laterality Date  . BLADDER SURGERY    . CORONARY ARTERY BYPASS GRAFT    . INGUINAL HERNIA REPAIR Right   . LASER ABLATION     x 2 prostatic hypertrophy  . TONSILLECTOMY      Home Medications:  Allergies as of 09/04/2017      Reactions   Penicillins Swelling   Has patient had a PCN reaction causing immediate rash, facial/tongue/throat swelling, SOB or  lightheadedness with hypotension: Yes Has patient had a PCN reaction causing severe rash involving mucus membranes or skin necrosis: No Has patient had a PCN reaction that required hospitalization No Has patient had a PCN reaction occurring within the last 10 years: No If all of the above answers are "NO", then may proceed with Cephalosporin use.      Medication List       Accurate as of 09/04/17  1:40 PM. Always use your most recent med list.          acetaminophen 500 MG tablet Commonly known as:  TYLENOL Take 500 mg by mouth every 6 (six) hours as needed.   albuterol 108 (90 Base) MCG/ACT inhaler Commonly known as:  PROVENTIL HFA;VENTOLIN HFA Inhale 2 puffs into the lungs every 6 (six) hours as needed for wheezing or shortness of breath.   atorvastatin 10 MG tablet Commonly known as:  LIPITOR Take 5 mg by mouth daily.   diltiazem 240 MG 24 hr capsule Commonly known as:  DILACOR XR Take 240 mg by mouth daily.   Fish Oil 1000 MG Caps Take 1 capsule by mouth daily.   loratadine 10 MG tablet Commonly known as:  CLARITIN Take 10 mg by mouth daily as needed for allergies.   metoprolol succinate 25 MG 24 hr tablet Commonly known as:  TOPROL-XL Take 25 mg  by mouth 2 (two) times daily.   multivitamin with minerals tablet Take 1 tablet by mouth daily.   multivitamin-lutein Caps capsule Take 2 capsules by mouth daily.   omeprazole 20 MG capsule Commonly known as:  PRILOSEC Take 20 mg by mouth daily.   VITAMIN B 12 PO Take 1 Dose by mouth daily.       Allergies:  Allergies  Allergen Reactions  . Penicillins Swelling    Has patient had a PCN reaction causing immediate rash, facial/tongue/throat swelling, SOB or lightheadedness with hypotension: Yes Has patient had a PCN reaction causing severe rash involving mucus membranes or skin necrosis: No Has patient had a PCN reaction that required hospitalization No Has patient had a PCN reaction occurring within the last  10 years: No If all of the above answers are "NO", then may proceed with Cephalosporin use.     Family History: Family History  Problem Relation Age of Onset  . Breast cancer Mother   . Diabetes Father   . Heart disease Father   . Heart disease Brother   . Heart disease Brother   . Prostate cancer Neg Hx   . Bladder Cancer Neg Hx   . Kidney cancer Neg Hx     Social History:  reports that he has never smoked. He has never used smokeless tobacco. He reports that he does not drink alcohol or use drugs.  ROS: UROLOGY Frequent Urination?: No Hard to postpone urination?: No Burning/pain with urination?: No Get up at night to urinate?: Yes Leakage of urine?: Yes Urine stream starts and stops?: No Trouble starting stream?: No Do you have to strain to urinate?: No Blood in urine?: Yes Urinary tract infection?: No Sexually transmitted disease?: No Injury to kidneys or bladder?: No Painful intercourse?: No Weak stream?: No Erection problems?: Yes Penile pain?: No  Gastrointestinal Nausea?: No Vomiting?: No Indigestion/heartburn?: No Diarrhea?: No Constipation?: No  Constitutional Fever: No Night sweats?: No Weight loss?: No Fatigue?: No  Skin Skin rash/lesions?: No Itching?: Yes  Eyes Blurred vision?: No Double vision?: No  Ears/Nose/Throat Sore throat?: No Sinus problems?: No  Hematologic/Lymphatic Swollen glands?: No Easy bruising?: Yes  Cardiovascular Leg swelling?: No Chest pain?: No  Respiratory Cough?: No Shortness of breath?: No  Endocrine Excessive thirst?: No  Musculoskeletal Back pain?: No Joint pain?: No  Neurological Headaches?: No Dizziness?: No  Psychologic Depression?: No Anxiety?: No  Physical Exam: BP (!) 151/71 (BP Location: Right Arm, Patient Position: Sitting, Cuff Size: Normal)   Pulse 74   Ht 5\' 11"  (1.803 m)   Wt 174 lb 14.4 oz (79.3 kg)   BMI 24.39 kg/m   Constitutional:  Alert and oriented, No acute  distress. HEENT: [place] AT, moist mucus membranes.  Trachea midline, no masses. Cardiovascular: No clubbing, cyanosis, or edema. Respiratory: Normal respiratory effort, no increased work of breathing. GI: Abdomen is soft, nontender, nondistended, no abdominal masses GU: No CVA tenderness.  Skin: No rashes, bruises or suspicious lesions. Lymph: No cervical or inguinal adenopathy. Neurologic: Grossly intact, no focal deficits, moving all 4 extremities. Psychiatric: Normal mood and affect.  Laboratory Data: Lab Results  Component Value Date   WBC 11.4 (H) 07/31/2017   HGB 14.1 07/31/2017   HCT 41.5 07/31/2017   MCV 87.5 07/31/2017   PLT 73 (L) 07/31/2017    Lab Results  Component Value Date   CREATININE 0.92 12/28/2016    No results found for: PSA1  No results found for: TESTOSTERONE  No results found for:  HGBA1C  Urinalysis No results found for: SPECGRAV, PHUR, COLORU, APPEARANCEUR, LEUKOCYTESUR, PROTEINUR, GLUCOSEU, KETONESU, RBCU, BILIRUBINUR, UUROB, NITRITE  No results found for: LABMICR, [place], RBCUA, LABEPIT, MUCUS, BACTERIA  Pertinent Imaging: I reviewed the patient's CT scan, showed the patient the images, and discussed the findings. He has a 3 and a half centimeter bladder stone. He also has a very small, 4 mm, left poorly characterized nodule on the lower pole.   Assessment & Plan:  The patient has a large bladder stone with otherwise very few symptoms. He does have occasional hematuria, but no history of infections, pain, worsening urinary tract symptoms, or any other reason for urgent cystolitholapaxy.  The patient also has a very small left renal mass that is poorly characterized on the most recent CT scan. In addition he has a Bosniak 1 cysts bilaterally.   1. Bladder stones I discussed treatment options for the patient, and recommended that he consider bladder stone removal. I briefly discussed the operation with him. We discussed indications for the surgery as well  as the timing. At this point, there is no rush to get this done considering that he is only having intermittent mild hematuria with no other symptoms. The patient would like to defer surgery at this time, he will then follow up with [place] in 6 months for reevaluation.  - Urinalysis, Complete - CT ABDOMEN PELVIS W WO CONTRAST; Future  2. Renal mass The patient has a poorly characterized 4 mm left renal mass. Our plan is to follow this up with a repeat CT scan, with contrast, and 6 months. This will also allow [place] to reevaluate his bladder stone.  - CT ABDOMEN PELVIS W WO CONTRAST; Future   Return in about 6 months (around 03/05/2018) for w/ CT scan prior.  Ardis Hughs, [facility] [address], [place], [place] [address] [phone]

## 2017-10-01 ENCOUNTER — Inpatient Hospital Stay: Payer: PPO | Attending: Internal Medicine | Admitting: Internal Medicine

## 2017-10-01 ENCOUNTER — Inpatient Hospital Stay: Payer: PPO

## 2017-10-01 VITALS — BP 156/79 | HR 75 | Temp 97.8°F | Resp 20 | Ht 71.0 in | Wt 174.0 lb

## 2017-10-01 DIAGNOSIS — I252 Old myocardial infarction: Secondary | ICD-10-CM | POA: Insufficient documentation

## 2017-10-01 DIAGNOSIS — Z803 Family history of malignant neoplasm of breast: Secondary | ICD-10-CM | POA: Diagnosis not present

## 2017-10-01 DIAGNOSIS — N281 Cyst of kidney, acquired: Secondary | ICD-10-CM | POA: Insufficient documentation

## 2017-10-01 DIAGNOSIS — Z9225 Personal history of immunosupression therapy: Secondary | ICD-10-CM | POA: Diagnosis not present

## 2017-10-01 DIAGNOSIS — N210 Calculus in bladder: Secondary | ICD-10-CM

## 2017-10-01 DIAGNOSIS — Z86010 Personal history of colonic polyps: Secondary | ICD-10-CM | POA: Insufficient documentation

## 2017-10-01 DIAGNOSIS — Z85820 Personal history of malignant melanoma of skin: Secondary | ICD-10-CM | POA: Insufficient documentation

## 2017-10-01 DIAGNOSIS — Z880 Allergy status to penicillin: Secondary | ICD-10-CM | POA: Diagnosis not present

## 2017-10-01 DIAGNOSIS — D693 Immune thrombocytopenic purpura: Secondary | ICD-10-CM

## 2017-10-01 DIAGNOSIS — Z7982 Long term (current) use of aspirin: Secondary | ICD-10-CM | POA: Insufficient documentation

## 2017-10-01 DIAGNOSIS — Z79899 Other long term (current) drug therapy: Secondary | ICD-10-CM | POA: Insufficient documentation

## 2017-10-01 DIAGNOSIS — E785 Hyperlipidemia, unspecified: Secondary | ICD-10-CM

## 2017-10-01 DIAGNOSIS — K219 Gastro-esophageal reflux disease without esophagitis: Secondary | ICD-10-CM | POA: Insufficient documentation

## 2017-10-01 DIAGNOSIS — I2510 Atherosclerotic heart disease of native coronary artery without angina pectoris: Secondary | ICD-10-CM | POA: Insufficient documentation

## 2017-10-01 LAB — BASIC METABOLIC PANEL
Anion gap: 8 (ref 5–15)
BUN: 20 mg/dL (ref 6–20)
CHLORIDE: 108 mmol/L (ref 101–111)
CO2: 26 mmol/L (ref 22–32)
CREATININE: 0.97 mg/dL (ref 0.61–1.24)
Calcium: 9 mg/dL (ref 8.9–10.3)
GFR calc non Af Amer: >60 mL/min (ref >60)
GLUCOSE: 99 mg/dL (ref 65–99)
Potassium: 4.5 mmol/L (ref 3.5–5.1)
Sodium: 142 mmol/L (ref 135–145)

## 2017-10-01 LAB — CBC WITH DIFFERENTIAL/PLATELET
Basophils Absolute: 0.1 10*3/uL (ref 0–0.1)
Basophils Relative: 1 %
EOS ABS: 0.3 10*3/uL (ref 0–0.7)
Eosinophils Relative: 2 %
HEMATOCRIT: 42.4 % (ref 40.0–52.0)
HEMOGLOBIN: 14 g/dL (ref 13.0–18.0)
LYMPHS ABS: 1.5 10*3/uL (ref 1.0–3.6)
Lymphocytes Relative: 12 %
MCH: 29.3 pg (ref 26.0–34.0)
MCHC: 33.1 g/dL (ref 32.0–36.0)
MCV: 88.6 fL (ref 80.0–100.0)
MONOS PCT: 9 %
Monocytes Absolute: 1.1 10*3/uL — ABNORMAL HIGH (ref 0.2–1.0)
NEUTROS ABS: 10.1 10*3/uL — AB (ref 1.4–6.5)
NEUTROS PCT: 76 %
Platelets: 81 10*3/uL — ABNORMAL LOW (ref 150–440)
RBC: 4.78 MIL/uL (ref 4.40–5.90)
RDW: 14 % (ref 11.5–14.5)
WBC: 13.2 10*3/uL — ABNORMAL HIGH (ref 3.8–10.6)

## 2017-10-01 NOTE — Assessment & Plan Note (Addendum)
#   Chronic thrombocytopenia- likely ITP. S/p Rituxan x 4 [last 08/31/2016]. platlets 74/ STABLE; no significant benefit from Rituxan.   # continue surveillance for now. Discussed multiple options- if and when the patient needs treatment for low platelets [< 50-60s]  # Bladder stone-under surveillance/Dr.Herrick  # Left kidney- cyst; followed by urology.  # follow up 6 months/ labs. He will let us know if he has any worsening symptoms in the interim.

## 2017-10-01 NOTE — Progress Notes (Signed)
[place] OFFICE PROGRESS NOTE  Patient Care Team: [name], MD as PCP - General (Internal Medicine)   SUMMARY OF ONCOLOGIC HISTORY:  2012- CHRONIC ITP-[ BMBx; 2012- hypercellular 60%; megakaryocytes/no dyspoiesis; FISH- Neg; Dr.Pandit]; [place]- Abdo-Neg for spleen/liver; HIV/hepatitis-NEG; OCT 2017- Rituxan weekly x4  # July 2017- Melanoma s/p excision [? Stage; Dr.Dasher]  INTERVAL HISTORY:  A very pleasant 82 year old Caucasian male patient with ITP currently on surveillance is here for follow-up.  Patient had episode of hematuria for which was evaluated by urology. Patient was found to have a bladder stone. Currently on surveillance.  Patient denies any blood in stools or black stools. Does admit to easy bruising. He is on baby aspirin. He denies any gum bleeding/  Nosebleeds.   REVIEW OF SYSTEMS:  A complete 10 point review of system is done which is negative except mentioned above/history of present illness.   PAST MEDICAL HISTORY :  Past Medical History:  Diagnosis Date  . CAD (coronary artery disease)   . Cancer ([place])   . Colon polyps   . GERD (gastroesophageal reflux disease)   . Gout   . Hyperlipidemia   . IDA (iron deficiency anemia)   . Kidney stones   . MI (myocardial infarction) ([place]) 2004  . Mild anemia   . Thrombocytopenia ([place])     PAST SURGICAL HISTORY :   Past Surgical History:  Procedure Laterality Date  . BLADDER SURGERY    . CORONARY ARTERY BYPASS GRAFT    . INGUINAL HERNIA REPAIR Right   . LASER ABLATION     x 2 prostatic hypertrophy  . TONSILLECTOMY      FAMILY HISTORY :   Family History  Problem Relation Age of Onset  . Breast cancer Mother   . Diabetes Father   . Heart disease Father   . Heart disease Brother   . Heart disease Brother   . Prostate cancer Neg Hx   . Bladder Cancer Neg Hx   . Kidney cancer Neg Hx     SOCIAL HISTORY:   Social History   Tobacco Use  . Smoking status: Never Smoker  .  Smokeless tobacco: Never Used  Substance Use Topics  . Alcohol use: No    Alcohol/week: 0.0 oz  . Drug use: No    ALLERGIES:  is allergic to penicillins.  MEDICATIONS:  Current Outpatient Medications  Medication Sig Dispense Refill  . acetaminophen (TYLENOL) 500 MG tablet Take 500 mg by mouth every 6 (six) hours as needed.    Marland Kitchen atorvastatin (LIPITOR) 10 MG tablet Take 5 mg by mouth daily.     . Cyanocobalamin (VITAMIN B 12 PO) Take 1 Dose by mouth daily.    Marland Kitchen diltiazem (DILACOR XR) 240 MG 24 hr capsule Take 240 mg by mouth daily.     . furosemide (LASIX) 20 MG tablet Take 1 tablet as needed by mouth for edema.    Marland Kitchen lisinopril (PRINIVIL,ZESTRIL) 5 MG tablet Take 1 tablet daily by mouth.    . loratadine (CLARITIN) 10 MG tablet Take 10 mg by mouth daily as needed for allergies.    . metoprolol succinate (TOPROL-XL) 25 MG 24 hr tablet Take 25 mg by mouth 2 (two) times daily.    . Multiple Vitamins-Minerals (MULTIVITAMIN WITH MINERALS) tablet Take 1 tablet by mouth daily.    . multivitamin-lutein (OCUVITE-LUTEIN) CAPS capsule Take 2 capsules by mouth daily.    . Omega-3 Fatty Acids (FISH OIL) 1000 MG CAPS Take 1  capsule by mouth daily.    Marland Kitchen omeprazole (PRILOSEC) 20 MG capsule Take 20 mg by mouth daily.    Marland Kitchen albuterol (PROVENTIL HFA;VENTOLIN HFA) 108 (90 Base) MCG/ACT inhaler Inhale 2 puffs into the lungs every 6 (six) hours as needed for wheezing or shortness of breath. (Patient not taking: Reported on 10/01/2017) 1 Inhaler 0   No current facility-administered medications for this visit.     PHYSICAL EXAMINATION:   BP (!) 156/79 (BP Location: Left Arm, Patient Position: Sitting)   Pulse 75   Temp 97.8 F (36.6 C) (Tympanic)   Resp 20   Ht 5\' 11"  (1.803 m)   Wt 174 lb (78.9 kg)   BMI 24.27 kg/m   Filed Weights   10/01/17 1502  Weight: 174 lb (78.9 kg)    GENERAL: Well-nourished well-developed; Alert, no distress and comfortable.  Accompanied by his wife. EYES: no pallor or  icterus OROPHARYNX: no thrush or ulceration;  NECK: supple, no masses felt LYMPH:  no palpable lymphadenopathy in the cervical, axillary or inguinal regions LUNGS: clear to auscultation and  No wheeze or crackles HEART/CVS: regular rate & rhythm and no murmurs; No lower extremity edema ABDOMEN:abdomen soft, non-tender and normal bowel sounds Musculoskeletal:no cyanosis of digits and no clubbing  PSYCH: alert & oriented x 3 with fluent speech NEURO: no focal motor/sensory deficits SKIN:  no rashes or significant lesions; multiple chronic ecchymosis  LABORATORY DATA:  I have reviewed the data as listed    Component Value Date/Time   NA 142 10/01/2017 1437   NA 140 12/17/2012 1229   K 4.5 10/01/2017 1437   K 4.4 12/17/2012 1229   CL 108 10/01/2017 1437   CL 106 12/17/2012 1229   CO2 26 10/01/2017 1437   CO2 26 12/17/2012 1229   GLUCOSE 99 10/01/2017 1437   GLUCOSE 77 12/17/2012 1229   BUN 20 10/01/2017 1437   BUN 19 (H) 12/17/2012 1229   CREATININE 0.97 10/01/2017 1437   CREATININE 1.05 12/17/2012 1229   CALCIUM 9.0 10/01/2017 1437   CALCIUM 8.4 (L) 12/17/2012 1229   PROT 7.5 06/29/2016 0855   ALBUMIN 4.1 06/29/2016 0855   AST 19 06/29/2016 0855   ALT 17 06/29/2016 0855   ALKPHOS 55 06/29/2016 0855   BILITOT 0.6 06/29/2016 0855   GFRNONAA >60 10/01/2017 1437   GFRNONAA >60 12/17/2012 1229   GFRAA >60 10/01/2017 1437   GFRAA >60 12/17/2012 1229    No results found for: SPEP, UPEP  Lab Results  Component Value Date   WBC 13.2 (H) 10/01/2017   NEUTROABS 10.1 (H) 10/01/2017   HGB 14.0 10/01/2017   HCT 42.4 10/01/2017   MCV 88.6 10/01/2017   PLT 81 (L) 10/01/2017      Chemistry      Component Value Date/Time   NA 142 10/01/2017 1437   NA 140 12/17/2012 1229   K 4.5 10/01/2017 1437   K 4.4 12/17/2012 1229   CL 108 10/01/2017 1437   CL 106 12/17/2012 1229   CO2 26 10/01/2017 1437   CO2 26 12/17/2012 1229   BUN 20 10/01/2017 1437   BUN 19 (H) 12/17/2012 1229    CREATININE 0.97 10/01/2017 1437   CREATININE 1.05 12/17/2012 1229      Component Value Date/Time   CALCIUM 9.0 10/01/2017 1437   CALCIUM 8.4 (L) 12/17/2012 1229   ALKPHOS 55 06/29/2016 0855   AST 19 06/29/2016 0855   ALT 17 06/29/2016 0855   BILITOT 0.6 06/29/2016 0855  RADIOGRAPHIC STUDIES: I have personally reviewed the radiological images as listed and agreed with the findings in the report. No results found.   ASSESSMENT & PLAN:   Idiopathic thrombocytopenic purpura ([place]) # Chronic thrombocytopenia- likely ITP. S/p Rituxan x 4 [last 08/31/2016]. platlets 74/ STABLE; no significant benefit from Rituxan.   # continue surveillance for now. Discussed multiple options- if and when the patient needs treatment for low platelets [< 50-60s]  # Bladder stone-under surveillance/Dr.Herrick  # Left kidney- cyst; followed by urology.  # follow up 6 months/ labs. He will let us know if he has any worsening symptoms in the interim.      Cammie Sickle, MD 10/01/2017 7:52 PM

## 2017-10-11 DIAGNOSIS — J40 Bronchitis, not specified as acute or chronic: Secondary | ICD-10-CM | POA: Diagnosis not present

## 2017-10-15 DIAGNOSIS — X32XXXA Exposure to sunlight, initial encounter: Secondary | ICD-10-CM | POA: Diagnosis not present

## 2017-10-15 DIAGNOSIS — L570 Actinic keratosis: Secondary | ICD-10-CM | POA: Diagnosis not present

## 2017-10-15 DIAGNOSIS — Z85820 Personal history of malignant melanoma of skin: Secondary | ICD-10-CM | POA: Diagnosis not present

## 2017-10-15 DIAGNOSIS — D225 Melanocytic nevi of trunk: Secondary | ICD-10-CM | POA: Diagnosis not present

## 2017-10-15 DIAGNOSIS — L821 Other seborrheic keratosis: Secondary | ICD-10-CM | POA: Diagnosis not present

## 2017-10-15 DIAGNOSIS — Z85828 Personal history of other malignant neoplasm of skin: Secondary | ICD-10-CM | POA: Diagnosis not present

## 2017-11-14 DIAGNOSIS — H0016 Chalazion left eye, unspecified eyelid: Secondary | ICD-10-CM | POA: Diagnosis not present

## 2017-11-30 DIAGNOSIS — H353132 Nonexudative age-related macular degeneration, bilateral, intermediate dry stage: Secondary | ICD-10-CM | POA: Diagnosis not present

## 2018-01-31 DIAGNOSIS — I10 Essential (primary) hypertension: Secondary | ICD-10-CM | POA: Diagnosis not present

## 2018-01-31 DIAGNOSIS — E7849 Other hyperlipidemia: Secondary | ICD-10-CM | POA: Diagnosis not present

## 2018-01-31 DIAGNOSIS — I2510 Atherosclerotic heart disease of native coronary artery without angina pectoris: Secondary | ICD-10-CM | POA: Diagnosis not present

## 2018-01-31 DIAGNOSIS — I480 Paroxysmal atrial fibrillation: Secondary | ICD-10-CM | POA: Diagnosis not present

## 2018-02-07 DIAGNOSIS — M1000 Idiopathic gout, unspecified site: Secondary | ICD-10-CM | POA: Diagnosis not present

## 2018-02-07 DIAGNOSIS — D649 Anemia, unspecified: Secondary | ICD-10-CM | POA: Diagnosis not present

## 2018-02-07 DIAGNOSIS — I2510 Atherosclerotic heart disease of native coronary artery without angina pectoris: Secondary | ICD-10-CM | POA: Diagnosis not present

## 2018-02-07 DIAGNOSIS — D696 Thrombocytopenia, unspecified: Secondary | ICD-10-CM | POA: Diagnosis not present

## 2018-02-07 DIAGNOSIS — I10 Essential (primary) hypertension: Secondary | ICD-10-CM | POA: Diagnosis not present

## 2018-02-07 DIAGNOSIS — D692 Other nonthrombocytopenic purpura: Secondary | ICD-10-CM | POA: Diagnosis not present

## 2018-02-07 DIAGNOSIS — K219 Gastro-esophageal reflux disease without esophagitis: Secondary | ICD-10-CM | POA: Diagnosis not present

## 2018-02-07 DIAGNOSIS — I5022 Chronic systolic (congestive) heart failure: Secondary | ICD-10-CM | POA: Diagnosis not present

## 2018-02-07 DIAGNOSIS — I480 Paroxysmal atrial fibrillation: Secondary | ICD-10-CM | POA: Diagnosis not present

## 2018-02-07 DIAGNOSIS — E7849 Other hyperlipidemia: Secondary | ICD-10-CM | POA: Diagnosis not present

## 2018-02-07 DIAGNOSIS — I700 Atherosclerosis of aorta: Secondary | ICD-10-CM | POA: Diagnosis not present

## 2018-02-07 DIAGNOSIS — D693 Immune thrombocytopenic purpura: Secondary | ICD-10-CM | POA: Diagnosis not present

## 2018-02-22 ENCOUNTER — Ambulatory Visit
Admission: RE | Admit: 2018-02-22 | Discharge: 2018-02-22 | Disposition: A | Discharge status: Home / Self Care | Payer: PPO | Source: Ambulatory Visit | Attending: Urology | Admitting: Urology

## 2018-02-22 DIAGNOSIS — N281 Cyst of kidney, acquired: Secondary | ICD-10-CM | POA: Insufficient documentation

## 2018-02-22 DIAGNOSIS — K7689 Other specified diseases of liver: Secondary | ICD-10-CM | POA: Diagnosis not present

## 2018-02-22 DIAGNOSIS — N210 Calculus in bladder: Secondary | ICD-10-CM | POA: Diagnosis not present

## 2018-02-22 DIAGNOSIS — N2889 Other specified disorders of kidney and ureter: Secondary | ICD-10-CM | POA: Insufficient documentation

## 2018-02-22 DIAGNOSIS — K8020 Calculus of gallbladder without cholecystitis without obstruction: Secondary | ICD-10-CM | POA: Diagnosis not present

## 2018-02-22 DIAGNOSIS — I700 Atherosclerosis of aorta: Secondary | ICD-10-CM | POA: Insufficient documentation

## 2018-02-22 MED ORDER — IOPAMIDOL (ISOVUE-370) INJECTION 76%
100.0000 mL | Freq: Once | INTRAVENOUS | Status: AC | PRN
  Administered 2018-02-22: 100 mL via INTRAVENOUS

## 2018-02-28 ENCOUNTER — Ambulatory Visit: Payer: PPO

## 2018-03-07 ENCOUNTER — Ambulatory Visit: Payer: PPO

## 2018-03-14 ENCOUNTER — Encounter: Payer: Self-pay | Admitting: Urology

## 2018-03-14 ENCOUNTER — Ambulatory Visit: Payer: PPO | Admitting: Urology

## 2018-03-14 VITALS — BP 149/71 | HR 77 | Ht 71.0 in | Wt 176.8 lb

## 2018-03-14 DIAGNOSIS — N210 Calculus in bladder: Secondary | ICD-10-CM

## 2018-03-14 LAB — MICROSCOPIC EXAMINATION

## 2018-03-14 LAB — URINALYSIS, COMPLETE
BILIRUBIN UA: NEGATIVE
Glucose, UA: NEGATIVE
Ketones, UA: NEGATIVE
Nitrite, UA: NEGATIVE
PH UA: 5 (ref 5.0–7.5)
Specific Gravity, UA: 1.025 (ref 1.005–1.030)
UUROB: 0.2 mg/dL (ref 0.2–1.0)

## 2018-03-14 NOTE — Progress Notes (Signed)
03/14/2018 4:45 PM   Levi Figueroa 1935-01-24 [id number]  Referring provider: [name], MD [place] [facility] [place], [place] [address]  Chief Complaint  Patient presents with  . Follow-up    HPI: 83 year old man with an extensive history of bladder outlet obstruction who has had several laser prostate procedures by Dr. Eliberto Ivory in the past 15 years. His last procedure was in 2014. He presents today for further management and evaluation of a large bladder stone, this was seen on his CAT scan that was performed for gross hematuria one month prior.  The patient states that when he rides his lawnmower for more than an hour and a half he has some discolored urine. He denies dysuria. He denies any worsening frequency or urgency. He denies any suprapubic pain. He does endorse some bladder leakage first thing in the morning, but otherwise has no incontinence. He does not have any urinary frequency. He gets up once a night to void.  In addition to the bladder stone, there is a hyperdense nodule, 4 mm, and the left lower pole that was otherwise uncharacterized on the noncontrast CT scan.    Interval: The patient follows up today after undergoing a CT scan, 6 months prior from the last one.  This demonstrated a hyperdense cyst in the left lower pole, likely a benign cyst.  The patient denies any associated symptoms from his cyst.  The patient continues to have some hematuria, predominantly after mowing his grass.  He started to feel the bladder stone more often.  He does complain of some dysuria intermittently.  He is here to discuss removal of the bladder stone.  PMH: Past Medical History:  Diagnosis Date  . CAD (coronary artery disease)   . Cancer ([place])   . Colon polyps   . GERD (gastroesophageal reflux disease)   . Gout   . Hyperlipidemia   . IDA (iron deficiency anemia)   . Kidney stones   . MI (myocardial infarction) ([place]) 2004  . Mild anemia   .  Thrombocytopenia [facility])     Surgical History: Past Surgical History:  Procedure Laterality Date  . BLADDER SURGERY    . CORONARY ARTERY BYPASS GRAFT    . INGUINAL HERNIA REPAIR Right   . LASER ABLATION     x 2 prostatic hypertrophy  . TONSILLECTOMY      Home Medications:  Allergies as of 03/14/2018      Reactions   Penicillins Swelling   Has patient had a PCN reaction causing immediate rash, facial/tongue/throat swelling, SOB or lightheadedness with hypotension: Yes Has patient had a PCN reaction causing severe rash involving mucus membranes or skin necrosis: No Has patient had a PCN reaction that required hospitalization No Has patient had a PCN reaction occurring within the last 10 years: No If all of the above answers are "NO", then may proceed with Cephalosporin use.      Medication List        Accurate as of 03/14/18  4:45 PM. Always use your most recent med list.          acetaminophen 500 MG tablet Commonly known as:  TYLENOL Take 500 mg by mouth every 6 (six) hours as needed.   albuterol 108 (90 Base) MCG/ACT inhaler Commonly known as:  PROVENTIL HFA;VENTOLIN HFA Inhale 2 puffs into the lungs every 6 (six) hours as needed for wheezing or shortness of breath.   atorvastatin 10 MG tablet Commonly known as:  LIPITOR Take 5 mg by mouth daily.   diltiazem 240 MG 24 hr capsule Commonly known as:  DILACOR XR Take 240 mg by mouth daily.   Fish Oil 1000 MG Caps Take 1 capsule by mouth daily.   lisinopril 5 MG tablet Commonly known as:  PRINIVIL,ZESTRIL Take 1 tablet daily by mouth.   loratadine 10 MG tablet Commonly known as:  CLARITIN Take 10 mg by mouth daily as needed for allergies.   metoprolol succinate 25 MG 24 hr tablet Commonly known as:  TOPROL-XL Take 25 mg by mouth 2 (two) times daily.   multivitamin with minerals tablet Take 1 tablet by mouth daily.   multivitamin-lutein Caps capsule Take 2 capsules by mouth daily.   omeprazole 20 MG  capsule Commonly known as:  PRILOSEC Take 20 mg by mouth daily.   VITAMIN B 12 PO Take 1 Dose by mouth daily.       Allergies:  Allergies  Allergen Reactions  . Penicillins Swelling    Has patient had a PCN reaction causing immediate rash, facial/tongue/throat swelling, SOB or lightheadedness with hypotension: Yes Has patient had a PCN reaction causing severe rash involving mucus membranes or skin necrosis: No Has patient had a PCN reaction that required hospitalization No Has patient had a PCN reaction occurring within the last 10 years: No If all of the above answers are "NO", then may proceed with Cephalosporin use.     Family History: Family History  Problem Relation Age of Onset  . Breast cancer Mother   . Diabetes Father   . Heart disease Father   . Heart disease Brother   . Heart disease Brother   . Prostate cancer Neg Hx   . Bladder Cancer Neg Hx   . Kidney cancer Neg Hx     Social History:  reports that he has never smoked. He has never used smokeless tobacco. He reports that he does not drink alcohol or use drugs.  ROS: UROLOGY Frequent Urination?: No Hard to postpone urination?: No Burning/pain with urination?: No Get up at night to urinate?: No Leakage of urine?: No Urine stream starts and stops?: No Trouble starting stream?: No Do you have to strain to urinate?: No Blood in urine?: Yes Urinary tract infection?: No Sexually transmitted disease?: No Injury to kidneys or bladder?: No Painful intercourse?: No Weak stream?: No Erection problems?: No Penile pain?: No  Gastrointestinal Nausea?: No Vomiting?: No Indigestion/heartburn?: No Diarrhea?: No Constipation?: No  Constitutional Fever: No Night sweats?: No Weight loss?: No Fatigue?: No  Skin Skin rash/lesions?: No Itching?: No  Eyes Blurred vision?: No Double vision?: No  Ears/Nose/Throat Sore throat?: No Sinus problems?: No  Hematologic/Lymphatic Swollen glands?: No Easy  bruising?: No  Cardiovascular Leg swelling?: No Chest pain?: No  Respiratory Cough?: No Shortness of breath?: No  Endocrine Excessive thirst?: No  Musculoskeletal Back pain?: No Joint pain?: No  Neurological Headaches?: No Dizziness?: No  Psychologic Depression?: No Anxiety?: No  Physical Exam: BP (!) 149/71 (BP Location: Right Arm, Patient Position: Sitting, Cuff Size: Normal)   Pulse 77   Ht 5\' 11"  (1.803 m)   Wt 80.2 kg (176 lb 12.8 oz)   BMI 24.66 kg/m   Constitutional:  Alert and oriented, No acute distress. HEENT: [place] AT, moist mucus membranes.  Trachea midline, no masses. Cardiovascular: No clubbing, cyanosis, or edema. Respiratory: Normal respiratory effort, no increased work of breathing. GI: Abdomen is soft, nontender, nondistended, no abdominal masses GU: No CVA tenderness.  Skin: No  rashes, bruises or suspicious lesions. Lymph: No cervical or inguinal adenopathy. Neurologic: Grossly intact, no focal deficits, moving all 4 extremities. Psychiatric: Normal mood and affect.  Laboratory Data: Lab Results  Component Value Date   WBC 13.2 (H) 10/01/2017   HGB 14.0 10/01/2017   HCT 42.4 10/01/2017   MCV 88.6 10/01/2017   PLT 81 (L) 10/01/2017    Lab Results  Component Value Date   CREATININE 0.97 10/01/2017    No results found for: PSA1  No results found for: TESTOSTERONE  No results found for: HGBA1C  Urinalysis Lab Results  Component Value Date   SPECGRAV 1.025 03/14/2018   PHUR 5.0 03/14/2018   COLORU Yellow 03/14/2018   APPEARANCEUR Cloudy (A) 03/14/2018   LEUKOCYTESUR Trace (A) 03/14/2018   PROTEINUR 2+ (A) 03/14/2018   GLUCOSEU Negative 03/14/2018   KETONESU Negative 03/14/2018   RBCU 3+ (A) 03/14/2018   BILIRUBINUR Negative 03/14/2018   UUROB 0.2 03/14/2018   NITRITE Negative 03/14/2018    Lab Results  Component Value Date   LABMICR See below: 03/14/2018   WBCUA 0-5 03/14/2018   RBCUA 11-30 (A) 03/14/2018   LABEPIT 0-10  03/14/2018   MUCUS Present (A) 03/14/2018   BACTERIA Few (A) 03/14/2018    Pertinent Imaging: I reviewed the patient's CT scan demonstrating a 9 mm hyperdense cyst in the left lower pole.  He also has bilateral simple cyst.  The patient has a bladder stone measuring approximately 4 cm.   Assessment & Plan: The patient and I again discussed the treatment options for his bladder stone and I recommended cystolitholapaxy.  The patient has lots of activities planned for the summer and would like to get it Done in a way that will minimize the risk of him missing these events.  The timing seems to be best for the patient in the last week of June or beginning of July.  I will plan to have the patient follow-up with Dr. Bernardo Heater within 6 weeks so that this can be planned.  This may require coordination with hematology given the patient's history of ITP.  - Urinalysis, Complete - CT ABDOMEN PELVIS W WO CONTRAST; Future  2. Renal mass The patient's renal mass appears to be a hyperdense cyst in the left lower pole.  This is reassuring.  However, the patient also has a new finding of ill-defined lesion in the liver which was new.  This needs to be reimaged in 6 months according to the radiologist.  - [place]; Future   No follow-ups on file.  Ardis Hughs, [facility] [address] [place], [place] [address] [phone]

## 2018-04-01 ENCOUNTER — Inpatient Hospital Stay: Payer: PPO

## 2018-04-01 ENCOUNTER — Inpatient Hospital Stay: Payer: PPO | Attending: Internal Medicine | Admitting: Internal Medicine

## 2018-04-01 VITALS — BP 143/81 | HR 61 | Temp 97.7°F | Resp 16 | Wt 173.2 lb

## 2018-04-01 DIAGNOSIS — N210 Calculus in bladder: Secondary | ICD-10-CM | POA: Insufficient documentation

## 2018-04-01 DIAGNOSIS — R319 Hematuria, unspecified: Secondary | ICD-10-CM | POA: Diagnosis not present

## 2018-04-01 DIAGNOSIS — Z9225 Personal history of immunosupression therapy: Secondary | ICD-10-CM | POA: Diagnosis not present

## 2018-04-01 DIAGNOSIS — Z87442 Personal history of urinary calculi: Secondary | ICD-10-CM

## 2018-04-01 DIAGNOSIS — D693 Immune thrombocytopenic purpura: Secondary | ICD-10-CM | POA: Diagnosis not present

## 2018-04-01 DIAGNOSIS — Z85820 Personal history of malignant melanoma of skin: Secondary | ICD-10-CM | POA: Diagnosis not present

## 2018-04-01 DIAGNOSIS — K219 Gastro-esophageal reflux disease without esophagitis: Secondary | ICD-10-CM | POA: Diagnosis not present

## 2018-04-01 DIAGNOSIS — I252 Old myocardial infarction: Secondary | ICD-10-CM | POA: Diagnosis not present

## 2018-04-01 DIAGNOSIS — Z79899 Other long term (current) drug therapy: Secondary | ICD-10-CM

## 2018-04-01 DIAGNOSIS — N281 Cyst of kidney, acquired: Secondary | ICD-10-CM | POA: Insufficient documentation

## 2018-04-01 DIAGNOSIS — I2510 Atherosclerotic heart disease of native coronary artery without angina pectoris: Secondary | ICD-10-CM | POA: Diagnosis not present

## 2018-04-01 DIAGNOSIS — Z7982 Long term (current) use of aspirin: Secondary | ICD-10-CM | POA: Diagnosis not present

## 2018-04-01 DIAGNOSIS — E785 Hyperlipidemia, unspecified: Secondary | ICD-10-CM | POA: Diagnosis not present

## 2018-04-01 LAB — CBC WITH DIFFERENTIAL/PLATELET
Basophils Absolute: 0.1 10*3/uL (ref 0–0.1)
Basophils Relative: 1 %
EOS ABS: 0.3 10*3/uL (ref 0–0.7)
Eosinophils Relative: 2 %
HCT: 42.6 % (ref 40.0–52.0)
HEMOGLOBIN: 14.3 g/dL (ref 13.0–18.0)
LYMPHS ABS: 1 10*3/uL (ref 1.0–3.6)
Lymphocytes Relative: 8 %
MCH: 29.5 pg (ref 26.0–34.0)
MCHC: 33.6 g/dL (ref 32.0–36.0)
MCV: 87.7 fL (ref 80.0–100.0)
Monocytes Absolute: 0.9 10*3/uL (ref 0.2–1.0)
Monocytes Relative: 7 %
NEUTROS ABS: 10.6 10*3/uL — AB (ref 1.4–6.5)
NEUTROS PCT: 82 %
Platelets: 75 10*3/uL — ABNORMAL LOW (ref 150–440)
RBC: 4.86 MIL/uL (ref 4.40–5.90)
RDW: 14.5 % (ref 11.5–14.5)
WBC: 12.8 10*3/uL — ABNORMAL HIGH (ref 3.8–10.6)

## 2018-04-01 LAB — BASIC METABOLIC PANEL
ANION GAP: 8 (ref 5–15)
BUN: 20 mg/dL (ref 6–20)
CALCIUM: 9.2 mg/dL (ref 8.9–10.3)
CHLORIDE: 109 mmol/L (ref 101–111)
CO2: 25 mmol/L (ref 22–32)
CREATININE: 0.94 mg/dL (ref 0.61–1.24)
GFR calc non Af Amer: >60 mL/min (ref >60)
GLUCOSE: 117 mg/dL — AB (ref 65–99)
Potassium: 4.7 mmol/L (ref 3.5–5.1)
Sodium: 142 mmol/L (ref 135–145)

## 2018-04-01 NOTE — Assessment & Plan Note (Addendum)
#  Chronic ITP-status post Rituxan October 2017; no significant improvement platelets stable around 70s.  Not significantly symptomatic.  #Continue surveillance for now; recommend treatments [steroids/TPO agonist] if platelets less than 50-60/periprocedural.  # Bladder stone-under surveillance/Dr.Stoiff;  [place] lithotripsy [? June-July 2019].  He will let us know if platelets are low prior to procedure for possible intervention.  #10 mm hypervascular lesion unclear etiology; plan imaging again in 6 months.  #Bilateral kidney cysts; with 7 mm left inferior kidney exophytic complex cyst/Bosniak 2 monitor for now.  # follow up 6 months/ labs. He will let us know if he has any worsening symptoms in the interim.

## 2018-04-01 NOTE — Progress Notes (Signed)
[facility] OFFICE PROGRESS NOTE  Patient Care Team: [name], MD as PCP - General (Internal Medicine)   SUMMARY OF ONCOLOGIC HISTORY:  2012- CHRONIC ITP-[ BMBx; 2012- hypercellular 60%; megakaryocytes/no dyspoiesis; FISH- Neg; Dr.Pandit]; [place]- [place]-Neg for spleen/liver; HIV/hepatitis-NEG; OCT 2017- Rituxan weekly x4  # July 2017- Melanoma s/p excision [? Stage; Dr.Dasher]  INTERVAL HISTORY:  83 year old male patient with a history of ITP currently on surveillance.  Given history of bladder stone-he had a CT scan of the abdomen pelvis/again evaluated by urology-awaiting lithotripsy end of June 2019.   Patient admits to mild bruising.  Otherwise denies any blood in stools or black-colored stools.  He has intermittent episodes of blood in urine especially with exertion.  He continues on baby aspirin.  Review of Systems  Constitutional: Negative.   HENT: Negative.   Eyes: Negative.   Respiratory: Negative.   Cardiovascular: Negative.   Gastrointestinal: Negative.   Genitourinary: Negative.   Musculoskeletal: Negative.   Skin: Negative.   Neurological: Negative.   Endo/Heme/Allergies: Bruises/bleeds easily.  Psychiatric/Behavioral: Negative.      PAST MEDICAL HISTORY :  Past Medical History:  Diagnosis Date  . CAD (coronary artery disease)   . Cancer ([place])   . Colon polyps   . GERD (gastroesophageal reflux disease)   . Gout   . Hyperlipidemia   . IDA (iron deficiency anemia)   . Kidney stones   . MI (myocardial infarction) ([place]) 2004  . Mild anemia   . Thrombocytopenia ([place])     PAST SURGICAL HISTORY :   Past Surgical History:  Procedure Laterality Date  . BLADDER SURGERY    . CORONARY ARTERY BYPASS GRAFT    . INGUINAL HERNIA REPAIR Right   . LASER ABLATION     x 2 prostatic hypertrophy  . TONSILLECTOMY      FAMILY HISTORY :   Family History  Problem Relation Age of Onset  . Breast cancer Mother   . Diabetes Father   . Heart  disease Father   . Heart disease Brother   . Heart disease Brother   . Prostate cancer Neg Hx   . Bladder Cancer Neg Hx   . Kidney cancer Neg Hx     SOCIAL HISTORY:   Social History   Tobacco Use  . Smoking status: Never Smoker  . Smokeless tobacco: Never Used  Substance Use Topics  . Alcohol use: No    Alcohol/week: 0.0 oz  . Drug use: No    ALLERGIES:  is allergic to penicillins.  MEDICATIONS:  Current Outpatient Medications  Medication Sig Dispense Refill  . acetaminophen (TYLENOL) 500 MG tablet Take 500 mg by mouth every 6 (six) hours as needed.    Marland Kitchen atorvastatin (LIPITOR) 10 MG tablet Take 5 mg by mouth daily.     . Cyanocobalamin (VITAMIN B 12 PO) Take 1 Dose by mouth daily.    Marland Kitchen diltiazem (DILACOR XR) 240 MG 24 hr capsule Take 240 mg by mouth daily.     Marland Kitchen lisinopril (PRINIVIL,ZESTRIL) 5 MG tablet Take 1 tablet daily by mouth.    . loratadine (CLARITIN) 10 MG tablet Take 10 mg by mouth daily as needed for allergies.    . metoprolol succinate (TOPROL-XL) 25 MG 24 hr tablet Take 25 mg by mouth 2 (two) times daily.    . Multiple Vitamins-Minerals (MULTIVITAMIN WITH MINERALS) tablet Take 1 tablet by mouth daily.    . multivitamin-lutein (OCUVITE-LUTEIN) CAPS capsule Take 2 capsules by mouth daily.    Marland Kitchen  Omega-3 Fatty Acids (FISH OIL) 1000 MG CAPS Take 1 capsule by mouth daily.    Marland Kitchen omeprazole (PRILOSEC) 20 MG capsule Take 20 mg by mouth daily.    Marland Kitchen albuterol (PROVENTIL HFA;VENTOLIN HFA) 108 (90 Base) MCG/ACT inhaler Inhale 2 puffs into the lungs every 6 (six) hours as needed for wheezing or shortness of breath. (Patient not taking: Reported on 10/01/2017) 1 Inhaler 0   No current facility-administered medications for this visit.     PHYSICAL EXAMINATION:   BP (!) 143/81 (BP Location: Left Arm, Patient Position: Sitting)   Pulse 61   Temp 97.7 F (36.5 C) (Tympanic)   Resp 16   Wt 173 lb 3.2 oz (78.6 kg)   BMI 24.16 kg/m   Filed Weights   04/01/18 1048 04/01/18  1102  Weight: 173 lb 3.2 oz (78.6 kg) 173 lb 3.2 oz (78.6 kg)    GENERAL: Well-nourished well-developed; Alert, no distress and comfortable.  Accompanied by family.  EYES: no pallor or icterus OROPHARYNX: no thrush or ulceration; NECK: supple; no lymph nodes felt. LYMPH:  no palpable lymphadenopathy in the axillary or inguinal regions LUNGS: Decreased breath sounds auscultation bilaterally. No wheeze or crackles HEART/CVS: regular rate & rhythm and no murmurs; No lower extremity edema ABDOMEN:abdomen soft, non-tender and normal bowel sounds. No hepatomegaly or splenomegaly.  Musculoskeletal:no cyanosis of digits and no clubbing  PSYCH: alert & oriented x 3 with fluent speech NEURO: no focal motor/sensory deficits SKIN:  no rashes or significant lesions  LABORATORY DATA:  I have reviewed the data as listed    Component Value Date/Time   NA 142 04/01/2018 1026   NA 140 12/17/2012 1229   K 4.7 04/01/2018 1026   K 4.4 12/17/2012 1229   CL 109 04/01/2018 1026   CL 106 12/17/2012 1229   CO2 25 04/01/2018 1026   CO2 26 12/17/2012 1229   GLUCOSE 117 (H) 04/01/2018 1026   GLUCOSE 77 12/17/2012 1229   BUN 20 04/01/2018 1026   BUN 19 (H) 12/17/2012 1229   CREATININE 0.94 04/01/2018 1026   CREATININE 1.05 12/17/2012 1229   CALCIUM 9.2 04/01/2018 1026   CALCIUM 8.4 (L) 12/17/2012 1229   PROT 7.5 06/29/2016 0855   ALBUMIN 4.1 06/29/2016 0855   AST 19 06/29/2016 0855   ALT 17 06/29/2016 0855   ALKPHOS 55 06/29/2016 0855   BILITOT 0.6 06/29/2016 0855   GFRNONAA >60 04/01/2018 1026   GFRNONAA >60 12/17/2012 1229   GFRAA >60 04/01/2018 1026   GFRAA >60 12/17/2012 1229    No results found for: SPEP, UPEP  Lab Results  Component Value Date   WBC 12.8 (H) 04/01/2018   NEUTROABS 10.6 (H) 04/01/2018   HGB 14.3 04/01/2018   HCT 42.6 04/01/2018   MCV 87.7 04/01/2018   PLT 75 (L) 04/01/2018      Chemistry      Component Value Date/Time   NA 142 04/01/2018 1026   NA 140  12/17/2012 1229   K 4.7 04/01/2018 1026   K 4.4 12/17/2012 1229   CL 109 04/01/2018 1026   CL 106 12/17/2012 1229   CO2 25 04/01/2018 1026   CO2 26 12/17/2012 1229   BUN 20 04/01/2018 1026   BUN 19 (H) 12/17/2012 1229   CREATININE 0.94 04/01/2018 1026   CREATININE 1.05 12/17/2012 1229      Component Value Date/Time   CALCIUM 9.2 04/01/2018 1026   CALCIUM 8.4 (L) 12/17/2012 1229   ALKPHOS 55 06/29/2016 0855   AST  19 06/29/2016 0855   ALT 17 06/29/2016 0855   BILITOT 0.6 06/29/2016 0855       RADIOGRAPHIC STUDIES: I have personally reviewed the radiological images as listed and agreed with the findings in the report. No results found.   ASSESSMENT & PLAN:   Idiopathic thrombocytopenic purpura [facility]) #Chronic ITP-status post Rituxan October 2017; no significant improvement platelets stable around 70s.  Not significantly symptomatic.  #Continue surveillance for now; recommend treatments [steroids/TPO agonist] if platelets less than 50-60/periprocedural.  # Bladder stone-under surveillance/Dr.Stoiff;  [place] lithotripsy [? June-July 2019].  He will let us know if platelets are low prior to procedure for possible intervention.  #10 mm hypervascular lesion unclear etiology; plan imaging again in 6 months.  #Bilateral kidney cysts; with 7 mm left inferior kidney exophytic complex cyst/Bosniak 2 monitor for now.  # follow up 6 months/ labs. He will let us know if he has any worsening symptoms in the interim.      Cammie Sickle, MD 04/01/2018 1:14 PM

## 2018-04-15 DIAGNOSIS — D2272 Melanocytic nevi of left lower limb, including hip: Secondary | ICD-10-CM | POA: Diagnosis not present

## 2018-04-15 DIAGNOSIS — D2261 Melanocytic nevi of right upper limb, including shoulder: Secondary | ICD-10-CM | POA: Diagnosis not present

## 2018-04-15 DIAGNOSIS — D225 Melanocytic nevi of trunk: Secondary | ICD-10-CM | POA: Diagnosis not present

## 2018-04-15 DIAGNOSIS — Z85820 Personal history of malignant melanoma of skin: Secondary | ICD-10-CM | POA: Diagnosis not present

## 2018-04-15 DIAGNOSIS — L570 Actinic keratosis: Secondary | ICD-10-CM | POA: Diagnosis not present

## 2018-04-15 DIAGNOSIS — X32XXXA Exposure to sunlight, initial encounter: Secondary | ICD-10-CM | POA: Diagnosis not present

## 2018-04-15 DIAGNOSIS — Z85828 Personal history of other malignant neoplasm of skin: Secondary | ICD-10-CM | POA: Diagnosis not present

## 2018-04-15 DIAGNOSIS — Z08 Encounter for follow-up examination after completed treatment for malignant neoplasm: Secondary | ICD-10-CM | POA: Diagnosis not present

## 2018-04-15 DIAGNOSIS — D2262 Melanocytic nevi of left upper limb, including shoulder: Secondary | ICD-10-CM | POA: Diagnosis not present

## 2018-04-15 DIAGNOSIS — D2271 Melanocytic nevi of right lower limb, including hip: Secondary | ICD-10-CM | POA: Diagnosis not present

## 2018-05-01 ENCOUNTER — Ambulatory Visit: Payer: PPO | Admitting: Urology

## 2018-06-18 ENCOUNTER — Ambulatory Visit: Payer: PPO | Admitting: Urology

## 2018-06-18 ENCOUNTER — Encounter: Payer: Self-pay | Admitting: Urology

## 2018-06-18 ENCOUNTER — Other Ambulatory Visit: Payer: Self-pay | Admitting: Radiology

## 2018-06-18 DIAGNOSIS — N210 Calculus in bladder: Secondary | ICD-10-CM

## 2018-06-18 DIAGNOSIS — Z01818 Encounter for other preprocedural examination: Secondary | ICD-10-CM

## 2018-06-19 ENCOUNTER — Encounter: Payer: Self-pay | Admitting: Urology

## 2018-06-19 NOTE — Progress Notes (Signed)
06/18/2018 7:15 AM   Levi Figueroa September 06, 1935 [id number]  Referring provider: [name], MD [facility] [place], [place] [address]  Chief Complaint  Patient presents with  . Bladder Stones    HPI: 83 year old male who presents to schedule cystolitholapaxy.  He has previously seen Dr. Louis Meckel and has a significant history of bladder outlet obstruction.  He states he has had 2 laser prostate procedures and one previous cystolitholapaxy.  He was noted in October 2019 to have a 4 cm bladder calculus.  He wanted to hold off on treatment until late summer.  He notes occasional gross hematuria after being on a riding lawn more for over 2 hours.  He notes occasional mild dysuria.   PMH: Past Medical History:  Diagnosis Date  . CAD (coronary artery disease)   . Cancer ([place])   . Colon polyps   . GERD (gastroesophageal reflux disease)   . Gout   . Hyperlipidemia   . IDA (iron deficiency anemia)   . Kidney stones   . MI (myocardial infarction) ([place]) 2004  . Mild anemia   . Thrombocytopenia [facility])     Surgical History: Past Surgical History:  Procedure Laterality Date  . BLADDER SURGERY    . CORONARY ARTERY BYPASS GRAFT    . INGUINAL HERNIA REPAIR Right   . LASER ABLATION     x 2 prostatic hypertrophy  . TONSILLECTOMY      Home Medications:  Allergies as of 06/18/2018      Reactions   Penicillins Swelling   Has patient had a PCN reaction causing immediate rash, facial/tongue/throat swelling, SOB or lightheadedness with hypotension: Yes Has patient had a PCN reaction causing severe rash involving mucus membranes or skin necrosis: No Has patient had a PCN reaction that required hospitalization No Has patient had a PCN reaction occurring within the last 10 years: No If all of the above answers are "NO", then may proceed with Cephalosporin use.      Medication List        Accurate as of 06/18/18 11:59 PM. Always use your most recent med  list.          acetaminophen 500 MG tablet Commonly known as:  TYLENOL Take 500 mg by mouth every 6 (six) hours as needed.   albuterol 108 (90 Base) MCG/ACT inhaler Commonly known as:  PROVENTIL HFA;VENTOLIN HFA Inhale 2 puffs into the lungs every 6 (six) hours as needed for wheezing or shortness of breath.   atorvastatin 10 MG tablet Commonly known as:  LIPITOR Take 5 mg by mouth daily.   CARTIA XT 240 MG 24 hr capsule Generic drug:  diltiazem Take 240 mg by mouth daily.   diltiazem 240 MG 24 hr capsule Commonly known as:  DILACOR XR Take 240 mg by mouth daily.   Fish Oil 1000 MG Caps Take 1 capsule by mouth daily.   lisinopril 5 MG tablet Commonly known as:  PRINIVIL,ZESTRIL Take 1 tablet daily by mouth.   loratadine 10 MG tablet Commonly known as:  CLARITIN Take 10 mg by mouth daily as needed for allergies.   metoprolol succinate 25 MG 24 hr tablet Commonly known as:  TOPROL-XL Take 25 mg by mouth 2 (two) times daily.   metoprolol tartrate 25 MG tablet Commonly known as:  LOPRESSOR Take 25 mg by mouth 2 (two) times daily.   multivitamin with minerals tablet Take 1 tablet by mouth daily.   multivitamin-lutein Caps capsule Take  2 capsules by mouth daily.   omeprazole 20 MG capsule Commonly known as:  PRILOSEC Take 20 mg by mouth daily.   VITAMIN B 12 PO Take 1 Dose by mouth daily.       Allergies:  Allergies  Allergen Reactions  . Penicillins Swelling    Has patient had a PCN reaction causing immediate rash, facial/tongue/throat swelling, SOB or lightheadedness with hypotension: Yes Has patient had a PCN reaction causing severe rash involving mucus membranes or skin necrosis: No Has patient had a PCN reaction that required hospitalization No Has patient had a PCN reaction occurring within the last 10 years: No If all of the above answers are "NO", then may proceed with Cephalosporin use.     Family History: Family History  Problem Relation  Age of Onset  . Breast cancer Mother   . Diabetes Father   . Heart disease Father   . Heart disease Brother   . Heart disease Brother   . Prostate cancer Neg Hx   . Bladder Cancer Neg Hx   . Kidney cancer Neg Hx     Social History:  reports that he has never smoked. He has never used smokeless tobacco. He reports that he does not drink alcohol or use drugs.  ROS: UROLOGY Frequent Urination?: No Hard to postpone urination?: No Burning/pain with urination?: No Get up at night to urinate?: No Leakage of urine?: No Urine stream starts and stops?: No Trouble starting stream?: No Do you have to strain to urinate?: No Blood in urine?: Yes Urinary tract infection?: No Sexually transmitted disease?: No Injury to kidneys or bladder?: No Painful intercourse?: No Weak stream?: No Erection problems?: No Penile pain?: No  Gastrointestinal Nausea?: No Vomiting?: No Indigestion/heartburn?: No Diarrhea?: No Constipation?: No  Constitutional Fever: No Night sweats?: No Weight loss?: No Fatigue?: No  Skin Skin rash/lesions?: No Itching?: No  Eyes Blurred vision?: No Double vision?: No  Ears/Nose/Throat Sore throat?: No Sinus problems?: No  Hematologic/Lymphatic Swollen glands?: No Easy bruising?: Yes  Cardiovascular Leg swelling?: No Chest pain?: No  Respiratory Cough?: No Shortness of breath?: No  Endocrine Excessive thirst?: No  Musculoskeletal Back pain?: No Joint pain?: No  Neurological Headaches?: No Dizziness?: No  Psychologic Depression?: No Anxiety?: No  Physical Exam: BP (!) 145/70 (BP Location: Left Arm, Patient Position: Sitting, Cuff Size: Normal)   Pulse 65   Ht 5\' 11"  (1.803 m)   Wt 175 lb 6.4 oz (79.6 kg)   BMI 24.46 kg/m   Constitutional:  Alert and oriented, No acute distress. HEENT: [place] AT, moist mucus membranes.  Trachea midline, no masses. Cardiovascular: No clubbing, cyanosis, or edema.  RRR Respiratory: Normal respiratory  effort, no increased work of breathing.  Lungs clear GI: Abdomen is soft, nontender, nondistended, no abdominal masses GU: No CVA tenderness Lymph: No cervical or inguinal lymphadenopathy. Skin: No rashes, bruises or suspicious lesions. Neurologic: Grossly intact, no focal deficits, moving all 4 extremities. Psychiatric: Normal mood and affect.  Laboratory Data: Lab Results  Component Value Date   WBC 12.8 (H) 04/01/2018   HGB 14.3 04/01/2018   HCT 42.6 04/01/2018   MCV 87.7 04/01/2018   PLT 75 (L) 04/01/2018     Pertinent Imaging: CT was personally reviewed  Assessment & Plan:   83 year old male with a large bladder calculus.  I did discuss bladder outlet obstruction with incomplete emptying as an etiology of his bladder calculus formation.  He only desires treatment of his stones and declines any type  of outlet procedure.  His stone density is 545 Hounsfield units and should be amenable to cystolitholapaxy.  The procedure was discussed in detail including potential risks of bleeding, infection, bladder injury. The need for a catheter postoperatively was discussed.  He indicated all questions were answered and desires to proceed.  He is followed by hematology for thrombocytopenia.  Will message Dr. Rogue Bussing for preoperative recommendations.    [name], [facility] [address], [place], [place] [address] [phone]

## 2018-06-19 NOTE — H&P (View-Only) (Signed)
06/18/2018 7:15 AM   Levi Figueroa 1935/04/29 [id number]  Referring provider: [name], MD [facility] [place], [place] [address]  Chief Complaint  Patient presents with  . Bladder Stones    HPI: 83 year old male who presents to schedule cystolitholapaxy.  He has previously seen Dr. Louis Meckel and has a significant history of bladder outlet obstruction.  He states he has had 2 laser prostate procedures and one previous cystolitholapaxy.  He was noted in October 2019 to have a 4 cm bladder calculus.  He wanted to hold off on treatment until late summer.  He notes occasional gross hematuria after being on a riding lawn more for over 2 hours.  He notes occasional mild dysuria.   PMH: Past Medical History:  Diagnosis Date  . CAD (coronary artery disease)   . Cancer ([place])   . Colon polyps   . GERD (gastroesophageal reflux disease)   . Gout   . Hyperlipidemia   . IDA (iron deficiency anemia)   . Kidney stones   . MI (myocardial infarction) ([place]) 2004  . Mild anemia   . Thrombocytopenia [facility])     Surgical History: Past Surgical History:  Procedure Laterality Date  . BLADDER SURGERY    . CORONARY ARTERY BYPASS GRAFT    . INGUINAL HERNIA REPAIR Right   . LASER ABLATION     x 2 prostatic hypertrophy  . TONSILLECTOMY      Home Medications:  Allergies as of 06/18/2018      Reactions   Penicillins Swelling   Has patient had a PCN reaction causing immediate rash, facial/tongue/throat swelling, SOB or lightheadedness with hypotension: Yes Has patient had a PCN reaction causing severe rash involving mucus membranes or skin necrosis: No Has patient had a PCN reaction that required hospitalization No Has patient had a PCN reaction occurring within the last 10 years: No If all of the above answers are "NO", then may proceed with Cephalosporin use.      Medication List        Accurate as of 06/18/18 11:59 PM. Always use your most recent med  list.          acetaminophen 500 MG tablet Commonly known as:  TYLENOL Take 500 mg by mouth every 6 (six) hours as needed.   albuterol 108 (90 Base) MCG/ACT inhaler Commonly known as:  PROVENTIL HFA;VENTOLIN HFA Inhale 2 puffs into the lungs every 6 (six) hours as needed for wheezing or shortness of breath.   atorvastatin 10 MG tablet Commonly known as:  LIPITOR Take 5 mg by mouth daily.   CARTIA XT 240 MG 24 hr capsule Generic drug:  diltiazem Take 240 mg by mouth daily.   diltiazem 240 MG 24 hr capsule Commonly known as:  DILACOR XR Take 240 mg by mouth daily.   Fish Oil 1000 MG Caps Take 1 capsule by mouth daily.   lisinopril 5 MG tablet Commonly known as:  PRINIVIL,ZESTRIL Take 1 tablet daily by mouth.   loratadine 10 MG tablet Commonly known as:  CLARITIN Take 10 mg by mouth daily as needed for allergies.   metoprolol succinate 25 MG 24 hr tablet Commonly known as:  TOPROL-XL Take 25 mg by mouth 2 (two) times daily.   metoprolol tartrate 25 MG tablet Commonly known as:  LOPRESSOR Take 25 mg by mouth 2 (two) times daily.   multivitamin with minerals tablet Take 1 tablet by mouth daily.   multivitamin-lutein Caps capsule Take  2 capsules by mouth daily.   omeprazole 20 MG capsule Commonly known as:  PRILOSEC Take 20 mg by mouth daily.   VITAMIN B 12 PO Take 1 Dose by mouth daily.       Allergies:  Allergies  Allergen Reactions  . Penicillins Swelling    Has patient had a PCN reaction causing immediate rash, facial/tongue/throat swelling, SOB or lightheadedness with hypotension: Yes Has patient had a PCN reaction causing severe rash involving mucus membranes or skin necrosis: No Has patient had a PCN reaction that required hospitalization No Has patient had a PCN reaction occurring within the last 10 years: No If all of the above answers are "NO", then may proceed with Cephalosporin use.     Family History: Family History  Problem Relation  Age of Onset  . Breast cancer Mother   . Diabetes Father   . Heart disease Father   . Heart disease Brother   . Heart disease Brother   . Prostate cancer Neg Hx   . Bladder Cancer Neg Hx   . Kidney cancer Neg Hx     Social History:  reports that he has never smoked. He has never used smokeless tobacco. He reports that he does not drink alcohol or use drugs.  ROS: UROLOGY Frequent Urination?: No Hard to postpone urination?: No Burning/pain with urination?: No Get up at night to urinate?: No Leakage of urine?: No Urine stream starts and stops?: No Trouble starting stream?: No Do you have to strain to urinate?: No Blood in urine?: Yes Urinary tract infection?: No Sexually transmitted disease?: No Injury to kidneys or bladder?: No Painful intercourse?: No Weak stream?: No Erection problems?: No Penile pain?: No  Gastrointestinal Nausea?: No Vomiting?: No Indigestion/heartburn?: No Diarrhea?: No Constipation?: No  Constitutional Fever: No Night sweats?: No Weight loss?: No Fatigue?: No  Skin Skin rash/lesions?: No Itching?: No  Eyes Blurred vision?: No Double vision?: No  Ears/Nose/Throat Sore throat?: No Sinus problems?: No  Hematologic/Lymphatic Swollen glands?: No Easy bruising?: Yes  Cardiovascular Leg swelling?: No Chest pain?: No  Respiratory Cough?: No Shortness of breath?: No  Endocrine Excessive thirst?: No  Musculoskeletal Back pain?: No Joint pain?: No  Neurological Headaches?: No Dizziness?: No  Psychologic Depression?: No Anxiety?: No  Physical Exam: BP (!) 145/70 (BP Location: Left Arm, Patient Position: Sitting, Cuff Size: Normal)   Pulse 65   Ht 5\' 11"  (1.803 m)   Wt 175 lb 6.4 oz (79.6 kg)   BMI 24.46 kg/m   Constitutional:  Alert and oriented, No acute distress. HEENT: [place] AT, moist mucus membranes.  Trachea midline, no masses. Cardiovascular: No clubbing, cyanosis, or edema.  RRR Respiratory: Normal respiratory  effort, no increased work of breathing.  Lungs clear GI: Abdomen is soft, nontender, nondistended, no abdominal masses GU: No CVA tenderness Lymph: No cervical or inguinal lymphadenopathy. Skin: No rashes, bruises or suspicious lesions. Neurologic: Grossly intact, no focal deficits, moving all 4 extremities. Psychiatric: Normal mood and affect.  Laboratory Data: Lab Results  Component Value Date   WBC 12.8 (H) 04/01/2018   HGB 14.3 04/01/2018   HCT 42.6 04/01/2018   MCV 87.7 04/01/2018   PLT 75 (L) 04/01/2018     Pertinent Imaging: CT was personally reviewed  Assessment & Plan:   83 year old male with a large bladder calculus.  I did discuss bladder outlet obstruction with incomplete emptying as an etiology of his bladder calculus formation.  He only desires treatment of his stones and declines any type  of outlet procedure.  His stone density is 545 Hounsfield units and should be amenable to cystolitholapaxy.  The procedure was discussed in detail including potential risks of bleeding, infection, bladder injury. The need for a catheter postoperatively was discussed.  He indicated all questions were answered and desires to proceed.  He is followed by hematology for thrombocytopenia.  Will message Dr. Rogue Bussing for preoperative recommendations.    [name], [facility] [address] [place], [place] [address] [phone]

## 2018-06-20 LAB — CULTURE, URINE COMPREHENSIVE

## 2018-06-21 ENCOUNTER — Other Ambulatory Visit: Payer: Self-pay | Admitting: Radiology

## 2018-06-21 DIAGNOSIS — N210 Calculus in bladder: Secondary | ICD-10-CM

## 2018-06-27 ENCOUNTER — Telehealth: Payer: Self-pay | Admitting: Internal Medicine

## 2018-06-27 ENCOUNTER — Telehealth: Payer: Self-pay | Admitting: *Deleted

## 2018-06-27 ENCOUNTER — Encounter
Admission: RE | Admit: 2018-06-27 | Discharge: 2018-06-27 | Disposition: A | Discharge status: Home / Self Care | Payer: PPO | Source: Ambulatory Visit | Attending: Urology | Admitting: Urology

## 2018-06-27 ENCOUNTER — Telehealth: Payer: Self-pay | Admitting: Radiology

## 2018-06-27 ENCOUNTER — Other Ambulatory Visit: Payer: Self-pay

## 2018-06-27 DIAGNOSIS — I2510 Atherosclerotic heart disease of native coronary artery without angina pectoris: Secondary | ICD-10-CM | POA: Insufficient documentation

## 2018-06-27 DIAGNOSIS — Z01810 Encounter for preprocedural cardiovascular examination: Secondary | ICD-10-CM | POA: Insufficient documentation

## 2018-06-27 DIAGNOSIS — I444 Left anterior fascicular block: Secondary | ICD-10-CM | POA: Diagnosis not present

## 2018-06-27 DIAGNOSIS — E7849 Other hyperlipidemia: Secondary | ICD-10-CM | POA: Diagnosis not present

## 2018-06-27 DIAGNOSIS — I5022 Chronic systolic (congestive) heart failure: Secondary | ICD-10-CM | POA: Diagnosis not present

## 2018-06-27 DIAGNOSIS — Z01812 Encounter for preprocedural laboratory examination: Secondary | ICD-10-CM | POA: Insufficient documentation

## 2018-06-27 DIAGNOSIS — R9431 Abnormal electrocardiogram [ECG] [EKG]: Secondary | ICD-10-CM | POA: Insufficient documentation

## 2018-06-27 DIAGNOSIS — I700 Atherosclerosis of aorta: Secondary | ICD-10-CM | POA: Diagnosis not present

## 2018-06-27 DIAGNOSIS — R001 Bradycardia, unspecified: Secondary | ICD-10-CM | POA: Insufficient documentation

## 2018-06-27 DIAGNOSIS — I10 Essential (primary) hypertension: Secondary | ICD-10-CM | POA: Diagnosis not present

## 2018-06-27 DIAGNOSIS — D693 Immune thrombocytopenic purpura: Secondary | ICD-10-CM

## 2018-06-27 HISTORY — DX: Heart failure, unspecified: I50.9

## 2018-06-27 HISTORY — DX: Essential (primary) hypertension: I10

## 2018-06-27 LAB — CBC
HCT: 41.1 % (ref 40.0–52.0)
Hemoglobin: 13.7 g/dL (ref 13.0–18.0)
MCH: 29.4 pg (ref 26.0–34.0)
MCHC: 33.4 g/dL (ref 32.0–36.0)
MCV: 88.2 fL (ref 80.0–100.0)
Platelets: 65 10*3/uL — ABNORMAL LOW (ref 150–440)
RBC: 4.65 MIL/uL (ref 4.40–5.90)
RDW: 14.6 % — ABNORMAL HIGH (ref 11.5–14.5)
WBC: 12.3 10*3/uL — ABNORMAL HIGH (ref 3.8–10.6)

## 2018-06-27 MED ORDER — PREDNISONE 20 MG PO TABS: 60.0000 mg | ORAL_TABLET | Freq: Every day | ORAL | 0 refills | Status: DC

## 2018-06-27 NOTE — Telephone Encounter (Signed)
Anesthesia called wanting surgical clearance for this patient regarding his platelet count. States they are faxing a form over and wants a call back to confirm we received it. Ext # [phone]

## 2018-06-27 NOTE — Telephone Encounter (Signed)
I faxed a note back to preadmit with today's phone note and md recommendations.

## 2018-06-27 NOTE — Telephone Encounter (Signed)
Heather/Brooke- please inform pt/wife of my plan below  #Recommend prednisone 60 mg/day x 7 days; and check labs-CBC on 08/06-Tuesday [please order]; will make further recommendations based on labs. Also talk to him about some of the side effects of steroids- like insomnia; jitteriness; leg swelling etc.   Thx  Dr.Stoiff-FYI

## 2018-06-27 NOTE — Telephone Encounter (Signed)
-----   Message from [name], MD sent at 06/21/2018  2:42 PM EDT ----- Levi Figueroa is [name] scheduled for his cystolitholapaxy? ----- Message ----- From: Cammie Sickle, MD Sent: 06/19/2018   1:17 PM To: [name], MD, Sabino Gasser, RN, #  Thanks for reaching out to me.   I can start patient on steroids prior to procedure to get his platelets above  80-100.   However, I need to know couple of weeks ahead of your procedure so that I can time the steroids.  If any questions- please call me at [phone]/cell.  GB     ----- Message ----- From: [name], MD Sent: 06/19/2018   7:24 AM To: Cammie Sickle, MD  Good morning,  [name] is going to be scheduled for endoscopic laser fragmentation of his bladder calculus in the near future.  Do you have any specific recommendations for his thrombocytopenia?  Thanks, [employer]

## 2018-06-27 NOTE — Pre-Procedure Instructions (Signed)
NOTE FROM ONCOLOGY WITH RECOMMENDATIONS ON CHART. LAB RECHECK 07/02/18

## 2018-06-27 NOTE — Pre-Procedure Instructions (Addendum)
CLEARANCE BY CARDIOLOGY ON CHART  AS INSTRUCTED BY DR P CARROLL, REQUEST FOR ONCOLOGY TO ADDRESS PLT COUNT [phone]- CALLED AND FAXED TO DR Burlene Arnt. HAD TO LEAVE MESSAGE ON TRIAGE LINE. ALSO FAXED AND SPOKE WITH AMY AT DR STOIOFF'S

## 2018-06-27 NOTE — Telephone Encounter (Signed)
Spoke with patient. Patient made aware of script for prednisone sent to pharmacy. Discussed side effects of prednisone with patient.  New apt given to patient for Tuesday am for lab recheck. Cbc entered.

## 2018-06-27 NOTE — Patient Instructions (Signed)
Your procedure is scheduled on:07/05/18 Report to Day Surgery. [facility] SECOND FLOOR To find out your arrival time please call [phone] between 1PM - 3PM on 07/04/18  Remember: Instructions that are not followed completely may result in serious medical risk,  up to and including death, or upon the discretion of your surgeon and anesthesiologist your  surgery may need to be rescheduled.     _X__ 1. Do not eat food after midnight the night before your procedure.                 No gum chewing or hard candies. You may drink clear liquids up to 2 hours                 before you are scheduled to arrive for your surgery- DO not drink clear                 liquids within 2 hours of the start of your surgery.                 Clear Liquids include:  water, apple juice without pulp, clear carbohydrate                 drink such as Clearfast of Gatorade, Black Coffee or Tea (Do not add                 anything to coffee or tea).  __X__2.  On the morning of surgery brush your teeth with toothpaste and water, you                may rinse your mouth with mouthwash if you wish.  Do not swallow any toothpaste of mouthwash.     _X__ 3.  No Alcohol for 24 hours before or after surgery.   _X__ 4.  Do Not Smoke or use e-cigarettes For 24 Hours Prior to Your Surgery.                 Do not use any chewable tobacco products for at least 6 hours prior to                 surgery.  ____  5.  Bring all medications with you on the day of surgery if instructed.   __X__  6.  Notify your doctor if there is any change in your medical condition      (cold, fever, infections).     Do not wear jewelry, make-up, hairpins, clips or nail polish. Do not wear lotions, powders, or perfumes. You may wear deodorant. Do not shave 48 hours prior to surgery. Men may shave face and neck. Do not bring valuables to the hospital.    [facility] is not responsible for any belongings or  valuables.  Contacts, dentures or bridgework may not be worn into surgery. Leave your suitcase in the car. After surgery it may be brought to your room. For patients admitted to the hospital, discharge time is determined by your treatment team.   Patients discharged the day of surgery will not be allowed to drive home.    _X___ Take these medicines the morning of surgery with A SIP OF WATER:    1. CARTIA XT  2. DILTIAZEM  3. METOPROLOL  4.OMEPRAZOLE BEDTIME 07/04/18 AND MORNING OF SURGERY  5.  6.  ____ Fleet Enema (as directed)   ____ Use CHG Soap as directed  ____ Use inhalers on the day of surgery  ____ Stop metformin  2 days prior to surgery    ____ Take 1/2 of usual insulin dose the night before surgery. No insulin the morning          of surgery.   _X___ Stop Coumadin/Plavix/aspirin on   LAST DOSE 06/27/18  ____ Stop Anti-inflammatories on   _X___ Stop supplements until after surgery.  LAST DOSE 06/27/18  ____ Bring C-Pap to the hospital.

## 2018-06-27 NOTE — Addendum Note (Signed)
Addended by: Sabino Gasser on: 06/27/2018 10:55 AM   Modules accepted: Orders

## 2018-07-02 ENCOUNTER — Inpatient Hospital Stay: Payer: PPO | Attending: Internal Medicine

## 2018-07-02 DIAGNOSIS — D693 Immune thrombocytopenic purpura: Secondary | ICD-10-CM | POA: Diagnosis not present

## 2018-07-02 LAB — CBC WITH DIFFERENTIAL/PLATELET
BASOS ABS: 0.1 10*3/uL (ref 0–0.1)
Basophils Relative: 0 %
Eosinophils Absolute: 0 10*3/uL (ref 0–0.7)
Eosinophils Relative: 0 %
HEMATOCRIT: 41.4 % (ref 40.0–52.0)
Hemoglobin: 13.5 g/dL (ref 13.0–18.0)
LYMPHS ABS: 0.9 10*3/uL — AB (ref 1.0–3.6)
LYMPHS PCT: 4 %
MCH: 29 pg (ref 26.0–34.0)
MCHC: 32.7 g/dL (ref 32.0–36.0)
MCV: 88.9 fL (ref 80.0–100.0)
MONO ABS: 1.2 10*3/uL — AB (ref 0.2–1.0)
Monocytes Relative: 6 %
NEUTROS ABS: 19 10*3/uL — AB (ref 1.4–6.5)
Neutrophils Relative %: 90 %
Platelets: 76 10*3/uL — ABNORMAL LOW (ref 150–440)
RBC: 4.66 MIL/uL (ref 4.40–5.90)
RDW: 14.7 % — AB (ref 11.5–14.5)
WBC: 21.1 10*3/uL — ABNORMAL HIGH (ref 3.8–10.6)

## 2018-07-04 ENCOUNTER — Telehealth: Payer: Self-pay | Admitting: Internal Medicine

## 2018-07-04 MED ORDER — CIPROFLOXACIN IN D5W 400 MG/200ML IV SOLN
400.0000 mg | INTRAVENOUS | Status: AC
  Administered 2018-07-05: 400 mg via INTRAVENOUS

## 2018-07-04 NOTE — Telephone Encounter (Signed)
FYI-  I Spoke to Amy at Dr.Stoiff's office- platelets at 76 [s/p [prednisone x7 days]; which is acceptable for lithotripsy.   Elevated white count- at 21/ likely secondary to steroids use.  No signs of infection  # spoke to pt- pt currently off asprin.   # encouraged to call us over the weekend [I am on call]if any bleeding issues occur.  GB

## 2018-07-05 ENCOUNTER — Other Ambulatory Visit: Payer: Self-pay

## 2018-07-05 ENCOUNTER — Encounter
Admission: RE | Disposition: A | Discharge status: Home / Self Care | Payer: Self-pay | Source: Ambulatory Visit | Attending: Urology

## 2018-07-05 ENCOUNTER — Ambulatory Visit
Admission: RE | Admit: 2018-07-05 | Discharge: 2018-07-05 | Disposition: A | Discharge status: Home / Self Care | Payer: PPO | Source: Ambulatory Visit | Attending: Urology | Admitting: Urology

## 2018-07-05 ENCOUNTER — Ambulatory Visit: Payer: PPO | Admitting: Anesthesiology

## 2018-07-05 ENCOUNTER — Other Ambulatory Visit: Payer: Self-pay | Admitting: Urology

## 2018-07-05 ENCOUNTER — Telehealth: Payer: Self-pay | Admitting: Radiology

## 2018-07-05 DIAGNOSIS — K219 Gastro-esophageal reflux disease without esophagitis: Secondary | ICD-10-CM | POA: Insufficient documentation

## 2018-07-05 DIAGNOSIS — E785 Hyperlipidemia, unspecified: Secondary | ICD-10-CM | POA: Insufficient documentation

## 2018-07-05 DIAGNOSIS — I10 Essential (primary) hypertension: Secondary | ICD-10-CM | POA: Insufficient documentation

## 2018-07-05 DIAGNOSIS — N210 Calculus in bladder: Secondary | ICD-10-CM | POA: Insufficient documentation

## 2018-07-05 DIAGNOSIS — Z79899 Other long term (current) drug therapy: Secondary | ICD-10-CM | POA: Insufficient documentation

## 2018-07-05 DIAGNOSIS — I110 Hypertensive heart disease with heart failure: Secondary | ICD-10-CM | POA: Diagnosis not present

## 2018-07-05 DIAGNOSIS — I252 Old myocardial infarction: Secondary | ICD-10-CM | POA: Diagnosis not present

## 2018-07-05 DIAGNOSIS — I509 Heart failure, unspecified: Secondary | ICD-10-CM | POA: Diagnosis not present

## 2018-07-05 DIAGNOSIS — I2510 Atherosclerotic heart disease of native coronary artery without angina pectoris: Secondary | ICD-10-CM | POA: Diagnosis not present

## 2018-07-05 DIAGNOSIS — Z951 Presence of aortocoronary bypass graft: Secondary | ICD-10-CM | POA: Insufficient documentation

## 2018-07-05 HISTORY — PX: CYSTOSCOPY WITH LITHOLAPAXY: SHX1425

## 2018-07-05 HISTORY — PX: HOLMIUM LASER APPLICATION: SHX5852

## 2018-07-05 SURGERY — CYSTOSCOPY, WITH BLADDER CALCULUS LITHOLAPAXY
Anesthesia: General | Site: Bladder | Wound class: Clean Contaminated

## 2018-07-05 MED ORDER — DEXAMETHASONE SODIUM PHOSPHATE 10 MG/ML IJ SOLN
INTRAMUSCULAR | Status: DC | PRN
  Administered 2018-07-05: 5 mg via INTRAVENOUS

## 2018-07-05 MED ORDER — FENTANYL CITRATE (PF) 100 MCG/2ML IJ SOLN
INTRAMUSCULAR | Status: AC
  Filled 2018-07-05: qty 2

## 2018-07-05 MED ORDER — OXYCODONE HCL 5 MG PO TABS: 5.0000 mg | ORAL_TABLET | Freq: Once | ORAL | Status: DC | PRN

## 2018-07-05 MED ORDER — GLYCOPYRROLATE 0.2 MG/ML IJ SOLN
INTRAMUSCULAR | Status: DC | PRN
  Administered 2018-07-05: 0.2 mg via INTRAVENOUS

## 2018-07-05 MED ORDER — SEVOFLURANE IN SOLN
RESPIRATORY_TRACT | Status: AC
  Filled 2018-07-05: qty 250

## 2018-07-05 MED ORDER — OXYBUTYNIN CHLORIDE 5 MG PO TABS: ORAL_TABLET | ORAL | 0 refills | Status: DC

## 2018-07-05 MED ORDER — MEPERIDINE HCL 50 MG/ML IJ SOLN: 6.2500 mg | INTRAMUSCULAR | Status: DC | PRN

## 2018-07-05 MED ORDER — OXYBUTYNIN CHLORIDE 5 MG PO TABS
ORAL_TABLET | ORAL | 0 refills | Status: DC
Start: 2018-07-05 — End: 2018-07-05

## 2018-07-05 MED ORDER — LACTATED RINGERS IV SOLN
INTRAVENOUS | Status: DC
  Administered 2018-07-05: 08:00:00 via INTRAVENOUS

## 2018-07-05 MED ORDER — ONDANSETRON HCL 4 MG/2ML IJ SOLN
INTRAMUSCULAR | Status: DC | PRN
  Administered 2018-07-05: 4 mg via INTRAVENOUS

## 2018-07-05 MED ORDER — PROPOFOL 10 MG/ML IV BOLUS
INTRAVENOUS | Status: DC | PRN
  Administered 2018-07-05: 120 mg via INTRAVENOUS

## 2018-07-05 MED ORDER — OXYCODONE HCL 5 MG/5ML PO SOLN: 5.0000 mg | Freq: Once | ORAL | Status: DC | PRN

## 2018-07-05 MED ORDER — EPHEDRINE SULFATE 50 MG/ML IJ SOLN
INTRAMUSCULAR | Status: DC | PRN
  Administered 2018-07-05 (×4): 10 mg via INTRAVENOUS

## 2018-07-05 MED ORDER — LIDOCAINE HCL (PF) 2 % IJ SOLN
INTRAMUSCULAR | Status: DC | PRN
  Administered 2018-07-05: 50 mg

## 2018-07-05 MED ORDER — FENTANYL CITRATE (PF) 100 MCG/2ML IJ SOLN
INTRAMUSCULAR | Status: DC | PRN
  Administered 2018-07-05: 25 ug via INTRAVENOUS
  Administered 2018-07-05: 50 ug via INTRAVENOUS
  Administered 2018-07-05 (×2): 25 ug via INTRAVENOUS
  Administered 2018-07-05: 50 ug via INTRAVENOUS
  Administered 2018-07-05: 25 ug via INTRAVENOUS
  Administered 2018-07-05: 50 ug via INTRAVENOUS

## 2018-07-05 MED ORDER — DEXAMETHASONE SODIUM PHOSPHATE 10 MG/ML IJ SOLN
INTRAMUSCULAR | Status: AC
  Filled 2018-07-05: qty 1

## 2018-07-05 MED ORDER — CIPROFLOXACIN IN D5W 400 MG/200ML IV SOLN
INTRAVENOUS | Status: AC
  Filled 2018-07-05: qty 200

## 2018-07-05 MED ORDER — FENTANYL CITRATE (PF) 100 MCG/2ML IJ SOLN: 25.0000 ug | INTRAMUSCULAR | Status: DC | PRN

## 2018-07-05 MED ORDER — PROPOFOL 10 MG/ML IV BOLUS
INTRAVENOUS | Status: AC
  Filled 2018-07-05: qty 20

## 2018-07-05 MED ORDER — PROMETHAZINE HCL 25 MG/ML IJ SOLN: 6.2500 mg | INTRAMUSCULAR | Status: DC | PRN

## 2018-07-05 SURGICAL SUPPLY — 15 items
BAG DRAIN CYSTO-URO LG1000N (MISCELLANEOUS) ×3 IMPLANT
BASKET ZERO TIP 1.9FR (BASKET) IMPLANT
CATH FOL 2WAY LX 18X30 (CATHETERS) ×3 IMPLANT
FIBER LASER 1000 (Laser) ×3 IMPLANT
GLOVE BIO SURGEON STRL SZ8 (GLOVE) ×3 IMPLANT
GOWN STRL REUS W/ TWL LRG LVL3 (GOWN DISPOSABLE) ×2 IMPLANT
GOWN STRL REUS W/ TWL XL LVL3 (GOWN DISPOSABLE) ×1 IMPLANT
GOWN STRL REUS W/TWL LRG LVL3 (GOWN DISPOSABLE) ×4
GOWN STRL REUS W/TWL XL LVL3 (GOWN DISPOSABLE) ×2
KIT TURNOVER CYSTO (KITS) ×3 IMPLANT
PACK CYSTO AR (MISCELLANEOUS) ×3 IMPLANT
SET IRRIG Y TYPE TUR BLADDER L (SET/KITS/TRAYS/PACK) ×3 IMPLANT
SYRINGE IRR TOOMEY STRL 70CC (SYRINGE) IMPLANT
WATER STERILE IRR 1000ML POUR (IV SOLUTION) ×3 IMPLANT
WATER STERILE IRR 3000ML UROMA (IV SOLUTION) ×54 IMPLANT

## 2018-07-05 NOTE — Anesthesia Post-op Follow-up Note (Signed)
Anesthesia QCDR form completed.        

## 2018-07-05 NOTE — Transfer of Care (Signed)
Immediate Anesthesia Transfer of Care Note  Patient: Levi Figueroa  Procedure(s) Performed: CYSTOSCOPY WITH LITHOLAPAXY (N/A Bladder) HOLMIUM LASER APPLICATION (N/A Bladder)  Patient Location: PACU  Anesthesia Type:General  Level of Consciousness: sedated  Airway & Oxygen Therapy: Patient Spontanous Breathing and Patient connected to face mask oxygen  Post-op Assessment: Report given to RN and Post -op Vital signs reviewed and stable  Post vital signs: Reviewed  Last Vitals:  Vitals Value Taken Time  BP 142/68 07/05/2018 12:50 PM  Temp    Pulse 67 07/05/2018 12:50 PM  Resp 18 07/05/2018 12:50 PM  SpO2 100 % 07/05/2018 12:50 PM    Last Pain:  Vitals:   07/05/18 0753  TempSrc: Oral  PainSc: 0-No pain         Complications: No apparent anesthesia complications

## 2018-07-05 NOTE — Anesthesia Preprocedure Evaluation (Signed)
Anesthesia Evaluation  Patient identified by MRN, date of birth, ID band Patient awake    Reviewed: Allergy & Precautions, NPO status , Patient's Chart, lab work & pertinent test results, reviewed documented beta blocker date and time   History of Anesthesia Complications Negative for: history of anesthetic complications  Airway Mallampati: III  TM Distance: <3 FB Neck ROM: Full    Dental no notable dental hx.    Pulmonary neg pulmonary ROS, neg sleep apnea, neg COPD,    breath sounds clear to auscultation- rhonchi (-) wheezing      Cardiovascular hypertension, Pt. on medications (-) angina+ CAD, + Past MI and + CABG (2004)  (-) Cardiac Stents  Rhythm:Regular Rate:Normal - Systolic murmurs and - Diastolic murmurs Echo 29/9/37: MILD LV SYSTOLIC DYSFUNCTION (EF 16%) NORMAL RIGHT VENTRICULAR SYSTOLIC FUNCTION TRIVIAL REGURGITATION NOTED (trivial MR, trivial AI, trivial TR) NO VALVULAR STENOSIS   Neuro/Psych negative neurological ROS  negative psych ROS   GI/Hepatic Neg liver ROS, GERD  ,  Endo/Other  negative endocrine ROSneg diabetes  Renal/GU Renal disease: nephrolithiasis.     Musculoskeletal negative musculoskeletal ROS (+)   Abdominal (+) - obese,   Peds  Hematology  (+) anemia ,   Anesthesia Other Findings Past Medical History: No date: CAD (coronary artery disease) No date: Cancer ([place])     Comment:  melanoma No date: CHF (congestive heart failure) ([place])     Comment:  2017 with pneumonia No date: Colon polyps No date: GERD (gastroesophageal reflux disease) No date: Gout No date: Hyperlipidemia No date: Hypertension No date: IDA (iron deficiency anemia) No date: Kidney stones 2004: MI (myocardial infarction) ([place]) No date: Mild anemia No date: Thrombocytopenia ([place])   Reproductive/Obstetrics                             Anesthesia Physical Anesthesia Plan  ASA:  III  Anesthesia Plan: General   Post-op Pain Management:    Induction: Intravenous  PONV Risk Score and Plan: 1 and Ondansetron and Midazolam  Airway Management Planned: LMA  Additional Equipment:   Intra-op Plan:   Post-operative Plan:   Informed Consent: I have reviewed the patients History and Physical, chart, labs and discussed the procedure including the risks, benefits and alternatives for the proposed anesthesia with the patient or authorized representative who has indicated his/her understanding and acceptance.   Dental advisory given  Plan Discussed with: CRNA and Anesthesiologist  Anesthesia Plan Comments:         Anesthesia Quick Evaluation

## 2018-07-05 NOTE — Interval H&P Note (Signed)
History and Physical Interval Note:  07/05/2018 8:07 AM  Levi Figueroa  has presented today for surgery, with the diagnosis of bladder stone  The various methods of treatment have been discussed with the patient and family. After consideration of risks, benefits and other options for treatment, the patient has consented to  Procedure(s): CYSTOSCOPY WITH LITHOLAPAXY (N/A) HOLMIUM LASER APPLICATION (N/A) as a surgical intervention .  The patient's history has been reviewed, patient examined, no change in status, stable for surgery.  I have reviewed the patient's chart and labs.  Questions were answered to the patient's satisfaction.     [place]

## 2018-07-05 NOTE — OR Nursing (Signed)
No rx on chart.  D/C instructions advise both escript and written.  Discussed with Dr. Elenor Quinones via tele.  Advises he escripted in to patient's pharmacy.   Patient/family aware of same.

## 2018-07-05 NOTE — Telephone Encounter (Signed)
Scheduled Tuesday per Kallie Locks.

## 2018-07-05 NOTE — OR Nursing (Signed)
Instructions indicate pt may remove foley at home Monday am if he would like; if not, keep appt with nurse to do on Monday am.  Pt advises he prefers for nurse to do in office.

## 2018-07-05 NOTE — Telephone Encounter (Signed)
-----   Message from [name], MD sent at 07/05/2018  2:12 PM EDT ----- Needs appointment on Monday for nurse visit/catheter removal

## 2018-07-05 NOTE — Anesthesia Postprocedure Evaluation (Signed)
Anesthesia Post Note  Patient: Levi Figueroa  Procedure(s) Performed: CYSTOSCOPY WITH LITHOLAPAXY (N/A Bladder) HOLMIUM LASER APPLICATION (N/A Bladder)  Patient location during evaluation: PACU Anesthesia Type: General Level of consciousness: awake and alert Pain management: pain level controlled Vital Signs Assessment: post-procedure vital signs reviewed and stable Respiratory status: spontaneous breathing, nonlabored ventilation, respiratory function stable and patient connected to nasal cannula oxygen Cardiovascular status: blood pressure returned to baseline and stable Postop Assessment: no apparent nausea or vomiting Anesthetic complications: no     Last Vitals:  Vitals:   07/05/18 1250 07/05/18 1304  BP: (!) 142/68   Pulse: 67   Resp: 18   Temp: (!) 36.4 C   SpO2: 100% 97%    Last Pain:  Vitals:   07/05/18 1304  TempSrc:   PainSc: 0-No pain                 Kevron Patella S

## 2018-07-05 NOTE — Discharge Instructions (Signed)

## 2018-07-05 NOTE — Anesthesia Procedure Notes (Signed)
Procedure Name: LMA Insertion Date/Time: 07/05/2018 8:23 AM Performed by: Rolla Plate, CRNA Pre-anesthesia Checklist: Patient identified, Emergency Drugs available, Suction available and Patient being monitored Patient Re-evaluated:Patient Re-evaluated prior to induction Oxygen Delivery Method: Circle system utilized Preoxygenation: Pre-oxygenation with 100% oxygen Induction Type: IV induction LMA: LMA inserted LMA Size: 4.5 Number of attempts: 1 Tube secured with: Tape Dental Injury: Teeth and Oropharynx as per pre-operative assessment

## 2018-07-08 NOTE — Op Note (Signed)
Preoperative diagnosis:  1. Bladder calculus  Postoperative diagnosis:  1. Bladder calculus  Procedure: 1. Cystolitholapaxy >2.5 cm  Surgeon: [name], MD  Anesthesia: General  Complications: None  Intraoperative findings:  4 cm bladder calculus  EBL: Minimal  Specimens: None  Indication: Levi Figueroa is a 83 y.o. patient with a long history of a 4 cm bladder calculus.  He has had 2 previous outlet procedures and declined any further prostate procedure.  After reviewing the management options for treatment, he elected to proceed with the above surgical procedure(s). We have discussed the potential benefits and risks of the procedure, side effects of the proposed treatment, the likelihood of the patient achieving the goals of the procedure, and any potential problems that might occur during the procedure or recuperation. Informed consent has been obtained.  Description of procedure:  The patient was taken to the operating room and general anesthesia was induced.  The patient was placed in the dorsal lithotomy position, prepped and draped in the usual sterile fashion, and preoperative antibiotics were administered. A preoperative time-out was performed.   The urethral meatus and penile urethra was gently dilated with Leander Rams sounds from 20-28 [place].  A 26 French continuous-flow resectoscope sheath with laser bridge was lubricated and passed per urethra.  The urethra was normal in caliber without stricture.  The prostate had a TUR defect with some residual adenoma near the veru.  The large bladder calculus was easily noted.  A 1000 m holmium laser fiber was placed through the resectoscope and the stone was initially fragmented at settings of 0.4 J / 40 Hz and subsequently increased to 0.8 J / 50 Hz.  Due to the large stone size complete fragmentation of the calculus was lengthy.  Once fragmented all fragments removed with manual irrigation and fill/strain.  At completion  procedure no significant fragments were remaining in the bladder.  No significant bleeding was noted.  An 38 French Foley catheter was placed without difficulty and was irrigated with clear effluent.  After anesthetic reversal the patient was transported to PACU in stable condition.    [name], M.D.

## 2018-07-09 ENCOUNTER — Ambulatory Visit: Payer: PPO

## 2018-07-09 ENCOUNTER — Other Ambulatory Visit: Payer: Self-pay

## 2018-07-09 ENCOUNTER — Inpatient Hospital Stay: Payer: PPO

## 2018-07-09 ENCOUNTER — Telehealth: Payer: Self-pay | Admitting: Internal Medicine

## 2018-07-09 DIAGNOSIS — D693 Immune thrombocytopenic purpura: Secondary | ICD-10-CM | POA: Diagnosis not present

## 2018-07-09 DIAGNOSIS — N210 Calculus in bladder: Secondary | ICD-10-CM

## 2018-07-09 LAB — BASIC METABOLIC PANEL
Anion gap: 7 (ref 5–15)
BUN: 24 mg/dL — AB (ref 8–23)
CALCIUM: 8.8 mg/dL — AB (ref 8.9–10.3)
CO2: 24 mmol/L (ref 22–32)
CREATININE: 1.05 mg/dL (ref 0.61–1.24)
Chloride: 108 mmol/L (ref 98–111)
GFR calc Af Amer: >60 mL/min (ref >60)
GLUCOSE: 107 mg/dL — AB (ref 70–99)
Potassium: 4.9 mmol/L (ref 3.5–5.1)
Sodium: 139 mmol/L (ref 135–145)

## 2018-07-09 LAB — APTT: APTT: 28 s (ref 24–36)

## 2018-07-09 LAB — CBC
HCT: 43.3 % (ref 40.0–52.0)
Hemoglobin: 14.5 g/dL (ref 13.0–18.0)
MCH: 29.7 pg (ref 26.0–34.0)
MCHC: 33.4 g/dL (ref 32.0–36.0)
MCV: 89.1 fL (ref 80.0–100.0)
PLATELETS: 52 10*3/uL — AB (ref 150–440)
RBC: 4.86 MIL/uL (ref 4.40–5.90)
RDW: 15.4 % — AB (ref 11.5–14.5)
WBC: 17.7 10*3/uL — ABNORMAL HIGH (ref 3.8–10.6)

## 2018-07-09 LAB — PROTIME-INR
INR: 1.01
PROTHROMBIN TIME: 13.2 s (ref 11.4–15.2)

## 2018-07-09 MED ORDER — DEXAMETHASONE 4 MG PO TABS: ORAL_TABLET | ORAL | 0 refills | Status: DC

## 2018-07-09 NOTE — Progress Notes (Signed)
Catheter Removal  Patient is present today for a catheter removal.  65ml of water was drained from the balloon. A 18FR foley cath was removed from the bladder no complications were noted . Patient tolerated well.  Preformed by: Cristie Hem, CMA  Follow up/ Additional notes: As Scheduled

## 2018-07-09 NOTE — Telephone Encounter (Signed)
I spoke to pt; that his platelets are- 52;bled yesterday; currently no bleeding noted.   However, I would recommend steroids  # dexamethasone 40 mg daily x4 days- new script sent to pharmacy.to take in AM.   # HOLD  lysteda for now.  Heather/brooke- please order CBC in 1 week.

## 2018-07-10 NOTE — Telephone Encounter (Signed)
Levi Figueroa, please schedule lab only on 07/16/18

## 2018-07-10 NOTE — Addendum Note (Signed)
Addended by: Sabino Gasser on: 07/10/2018 02:29 PM   Modules accepted: Orders

## 2018-07-16 ENCOUNTER — Inpatient Hospital Stay: Payer: PPO

## 2018-07-16 DIAGNOSIS — D693 Immune thrombocytopenic purpura: Secondary | ICD-10-CM

## 2018-07-16 LAB — CBC WITH DIFFERENTIAL/PLATELET
Basophils Absolute: 0.1 10*3/uL (ref 0–0.1)
Basophils Relative: 1 %
EOS PCT: 1 %
Eosinophils Absolute: 0.2 10*3/uL (ref 0–0.7)
HCT: 43.9 % (ref 40.0–52.0)
Hemoglobin: 14.3 g/dL (ref 13.0–18.0)
LYMPHS ABS: 1 10*3/uL (ref 1.0–3.6)
Lymphocytes Relative: 6 %
MCH: 29.3 pg (ref 26.0–34.0)
MCHC: 32.6 g/dL (ref 32.0–36.0)
MCV: 89.9 fL (ref 80.0–100.0)
MONOS PCT: 5 %
Monocytes Absolute: 1 10*3/uL (ref 0.2–1.0)
Neutro Abs: 16.2 10*3/uL — ABNORMAL HIGH (ref 1.4–6.5)
Neutrophils Relative %: 87 %
Platelets: 52 10*3/uL — ABNORMAL LOW (ref 150–440)
RBC: 4.89 MIL/uL (ref 4.40–5.90)
RDW: 15.2 % — ABNORMAL HIGH (ref 11.5–14.5)
WBC: 18.6 10*3/uL — AB (ref 3.8–10.6)

## 2018-08-07 DIAGNOSIS — M1000 Idiopathic gout, unspecified site: Secondary | ICD-10-CM | POA: Diagnosis not present

## 2018-08-07 DIAGNOSIS — D696 Thrombocytopenia, unspecified: Secondary | ICD-10-CM | POA: Diagnosis not present

## 2018-08-07 DIAGNOSIS — I10 Essential (primary) hypertension: Secondary | ICD-10-CM | POA: Diagnosis not present

## 2018-08-07 DIAGNOSIS — I480 Paroxysmal atrial fibrillation: Secondary | ICD-10-CM | POA: Diagnosis not present

## 2018-08-07 DIAGNOSIS — E7849 Other hyperlipidemia: Secondary | ICD-10-CM | POA: Diagnosis not present

## 2018-08-14 DIAGNOSIS — Z0000 Encounter for general adult medical examination without abnormal findings: Secondary | ICD-10-CM | POA: Diagnosis not present

## 2018-08-14 DIAGNOSIS — I5022 Chronic systolic (congestive) heart failure: Secondary | ICD-10-CM | POA: Diagnosis not present

## 2018-08-14 DIAGNOSIS — I700 Atherosclerosis of aorta: Secondary | ICD-10-CM | POA: Diagnosis not present

## 2018-08-14 DIAGNOSIS — M1000 Idiopathic gout, unspecified site: Secondary | ICD-10-CM | POA: Diagnosis not present

## 2018-08-14 DIAGNOSIS — D692 Other nonthrombocytopenic purpura: Secondary | ICD-10-CM | POA: Diagnosis not present

## 2018-08-14 DIAGNOSIS — E7849 Other hyperlipidemia: Secondary | ICD-10-CM | POA: Diagnosis not present

## 2018-08-14 DIAGNOSIS — K219 Gastro-esophageal reflux disease without esophagitis: Secondary | ICD-10-CM | POA: Diagnosis not present

## 2018-08-14 DIAGNOSIS — Z85820 Personal history of malignant melanoma of skin: Secondary | ICD-10-CM | POA: Diagnosis not present

## 2018-08-14 DIAGNOSIS — D693 Immune thrombocytopenic purpura: Secondary | ICD-10-CM | POA: Diagnosis not present

## 2018-08-14 DIAGNOSIS — R140 Abdominal distension (gaseous): Secondary | ICD-10-CM | POA: Diagnosis not present

## 2018-08-14 DIAGNOSIS — I2510 Atherosclerotic heart disease of native coronary artery without angina pectoris: Secondary | ICD-10-CM | POA: Diagnosis not present

## 2018-08-14 DIAGNOSIS — J9811 Atelectasis: Secondary | ICD-10-CM | POA: Diagnosis not present

## 2018-08-14 DIAGNOSIS — I10 Essential (primary) hypertension: Secondary | ICD-10-CM | POA: Diagnosis not present

## 2018-08-14 DIAGNOSIS — D649 Anemia, unspecified: Secondary | ICD-10-CM | POA: Diagnosis not present

## 2018-08-14 DIAGNOSIS — I480 Paroxysmal atrial fibrillation: Secondary | ICD-10-CM | POA: Diagnosis not present

## 2018-09-13 IMAGING — CT CT ABD-PEL WO/W CM
3 of 12 series · 12 of 46 positions shown, 13 images · IV contrast (iopamidol)
Comparison: 08/15/2017.

CLINICAL DATA: Left renal lesion and bladder stone on previous CT.

EXAM:
CT ABDOMEN AND PELVIS WITHOUT AND WITH CONTRAST
TECHNIQUE: Multidetector CT imaging of the abdomen and pelvis was performed
following the standard protocol before and following the bolus
administration of intravenous contrast.
CONTRAST:  100mL NBKX1O-WTU IOPAMIDOL (NBKX1O-WTU) INJECTION 76%

[Series 4: cor without renal without · coronal · non-contrast · 0.60mm/px · 2 of 155 slices shown]
[im 52/155  soft-tissue]
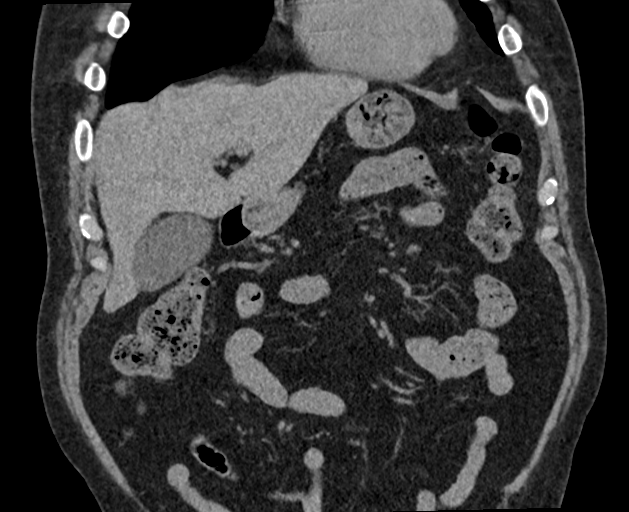
[im 103/155  soft-tissue]
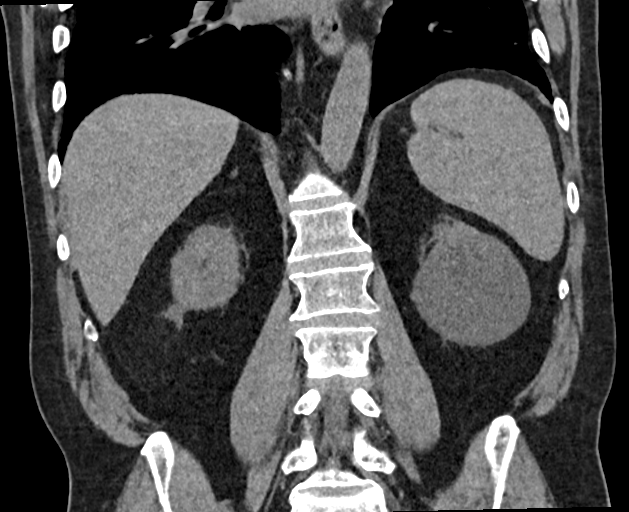

[Series 17: lung arterial renal arterial · axial · arterial · 0.74mm/px · z∈[-1280,-1228]mm · 2 of 78 slices shown]
[im 26/78  bone]
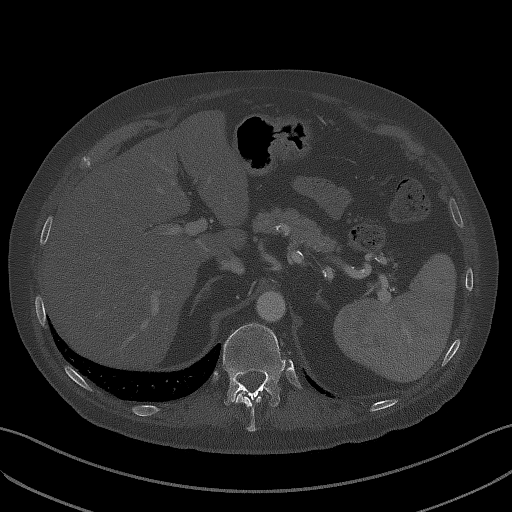
[im 52/78  bone]
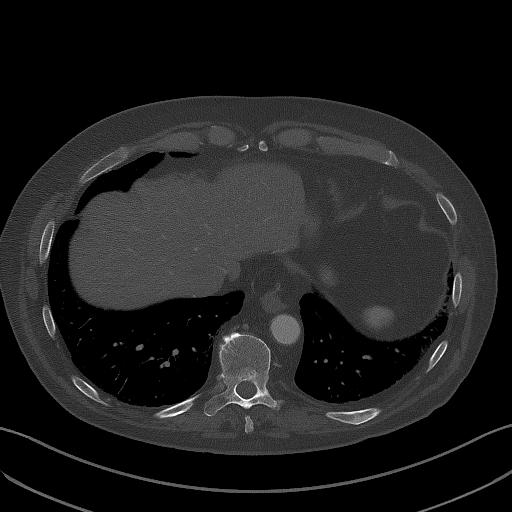

[Series 18: axial nephrographic renal nephrographic · axial · 0.61mm/px · z∈[-1640,-1234]mm · 8 of 261 slices shown, 9 images]
[im 29/261  soft-tissue]
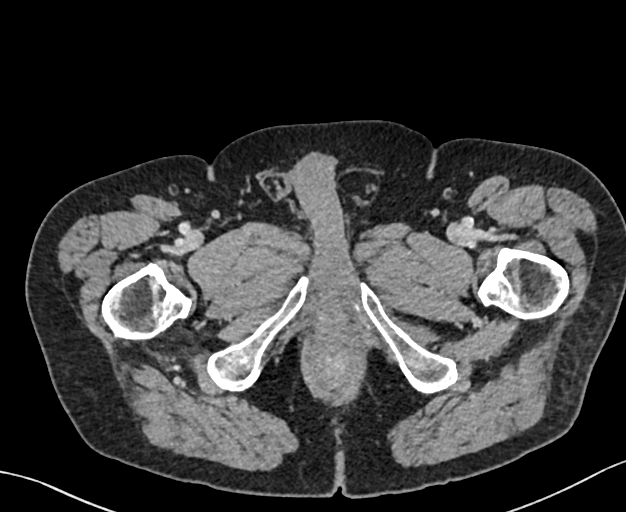
[im 29/261  bone]
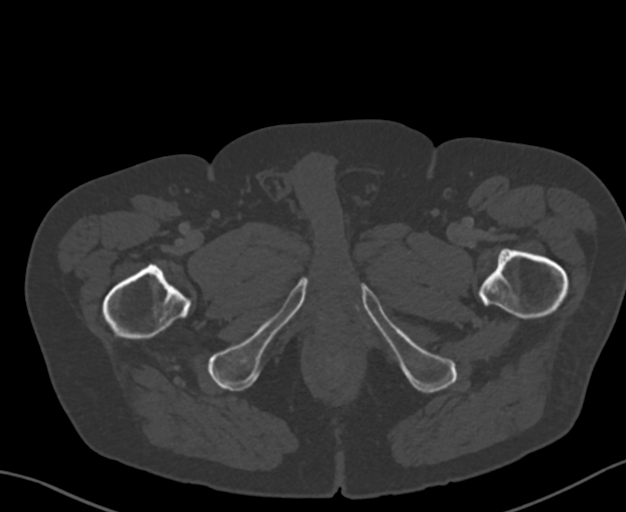
[im 58/261  soft-tissue]
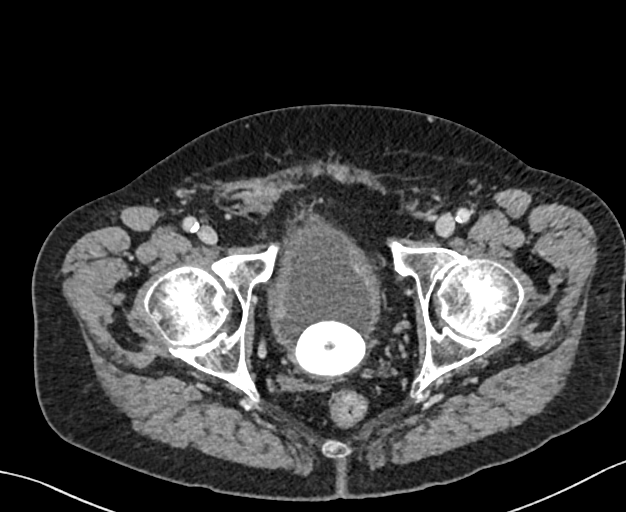
[im 87/261  soft-tissue]
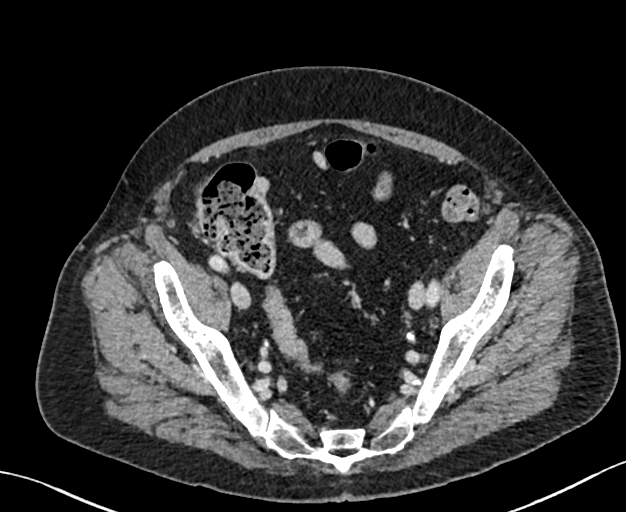
[im 116/261  soft-tissue]
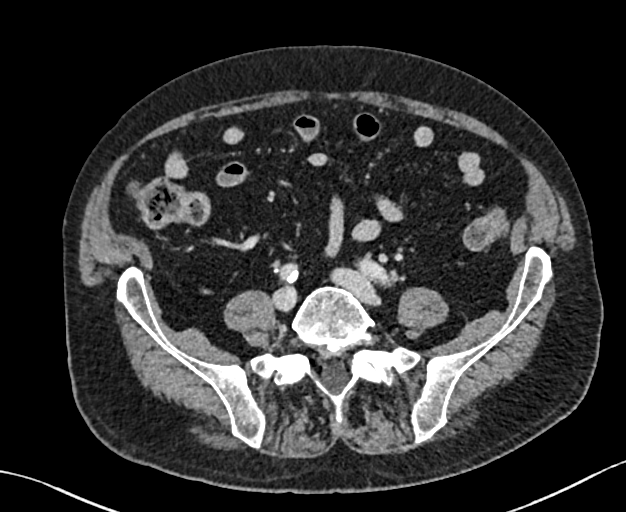
[im 145/261  soft-tissue]
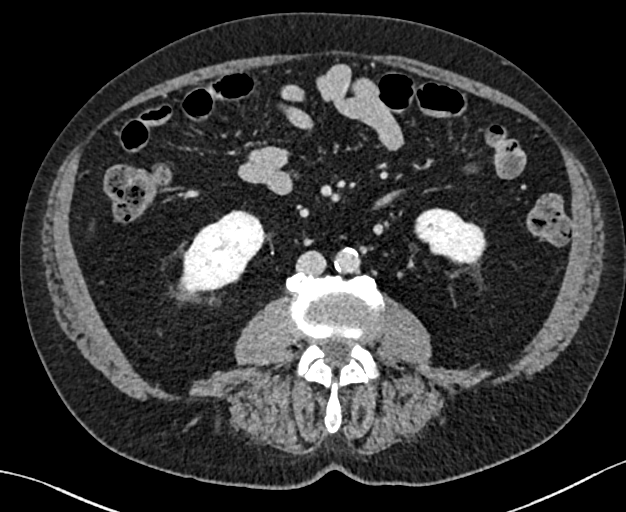
[im 174/261  soft-tissue]
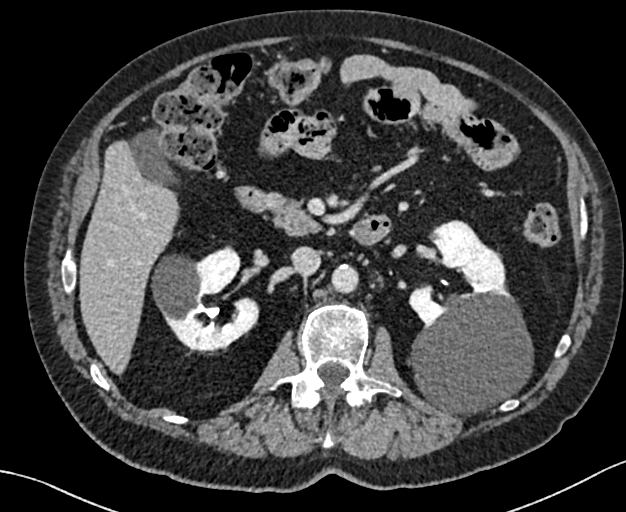
[im 203/261  soft-tissue]
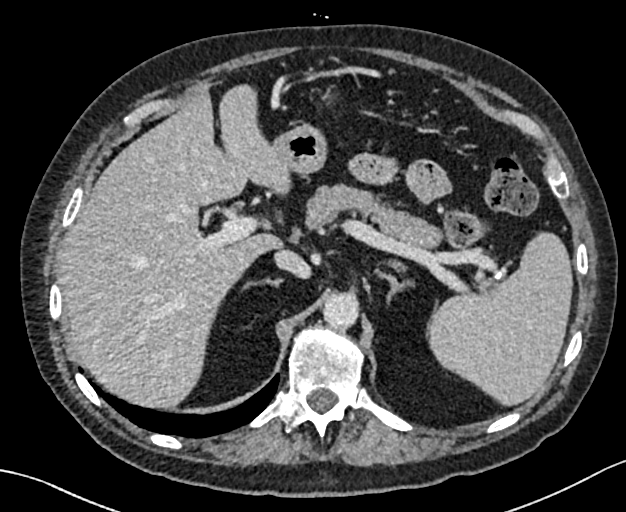
[im 232/261  soft-tissue]
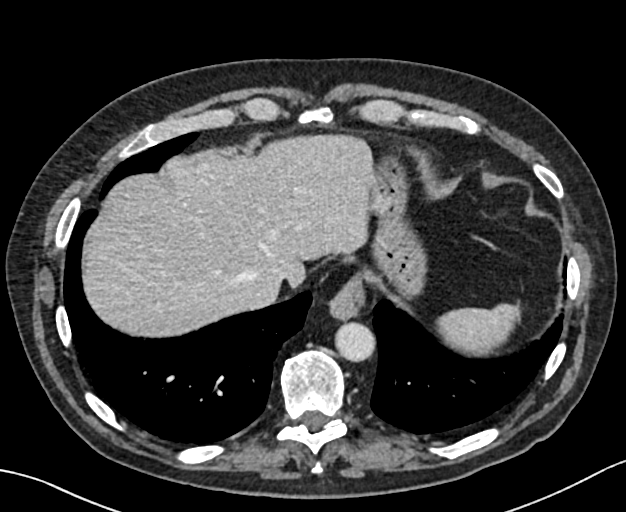

[12 of 46 positions shown; findings below may reference images not displayed]

FINDINGS: Lower chest: 5 mm left lower lobe nodule identified image 19/series
26. This has been stable since chest CT of 09/28/2016.

Hepatobiliary: 10 mm hypervascular lesion identified in the right
liver (image 60/36). Tiny layering gallstones evident. No
intrahepatic or extrahepatic biliary dilation.

Pancreas: No focal mass lesion. No dilatation of the main duct. No
intraparenchymal cyst. No peripancreatic edema.

Spleen: No splenomegaly. No focal mass lesion.

Adrenals/Urinary Tract: No adrenal nodule or mass.

Precontrast imaging shows no stones in either kidney.

Imaging after IV contrast administration shows multiple bilateral
renal cysts, measuring up to 4.2 cm in the right kidney and 7.9 cm
in the left kidney. Lesion in question on previous CT scan is
identified in the extreme lower pole left kidney today. This lesion
has increased in size in the interval measuring 8 mm today compared
to 4 mm previously. Lesion is prominently hyperattenuating on
precontrast imaging but shows no enhancement after IV contrast
administration.

Delayed imaging shows no wall thickening or soft tissue filling
defect within the opacified intrarenal collecting system/renal
pelvis of each kidney. Right ureter is well opacified and has normal
imaging features. Left ureter is incompletely opacified but shows no
focal dilatation, wall thickening, or discrete mass lesion.

Circumferential bladder wall thickening is evident with 3.4 x 4.3 cm
dependent bladder stone again noted.

Stomach/Bowel: Small hiatal hernia. Stomach otherwise unremarkable.
Duodenum is normally positioned as is the ligament of Treitz. No
small bowel wall thickening. No small bowel dilatation. The terminal
ileum is normal. The appendix is normal. No gross colonic mass. No
colonic wall thickening. No substantial diverticular change.

Vascular/Lymphatic: There is abdominal aortic atherosclerosis
without aneurysm. There is no gastrohepatic or hepatoduodenal
ligament lymphadenopathy. No intraperitoneal or retroperitoneal
lymphadenopathy. No pelvic sidewall lymphadenopathy..

Reproductive: Prostate gland is enlarged with probable central TURP
defect.

Other: No intraperitoneal free fluid.

Musculoskeletal: Soft tissue attenuation in the right inguinal
region suggest prior herniorrhaphy. Left groin hernia contains only
fat. Small umbilical hernia contains only fat. Bone windows reveal
no worrisome lytic or sclerotic osseous lesions.
IMPRESSION: 1. Tiny exophytic lesion lower pole left kidney has imaging features
most compatible with enlarging complex (Bosniak II) cyst although
tiny size somewhat limits assessment.
2. Large bladder stone again identified.
3. 10 mm hypervascular lesion identified in the right liver, but
cannot be fully characterized on today's study. MRI without and with
contrast may prove helpful to further evaluate. Alternatively,
repeat CT in 3-6 months could be used to assess stability.
4. Bilateral simple renal cysts.
5. Cholelithiasis.
6.  Aortic Atherosclerois (BPDEF-170.0)

## 2018-09-30 ENCOUNTER — Other Ambulatory Visit: Payer: Self-pay

## 2018-09-30 ENCOUNTER — Inpatient Hospital Stay (HOSPITAL_BASED_OUTPATIENT_CLINIC_OR_DEPARTMENT_OTHER): Payer: PPO | Admitting: Internal Medicine

## 2018-09-30 ENCOUNTER — Inpatient Hospital Stay: Payer: PPO | Attending: Internal Medicine

## 2018-09-30 VITALS — BP 148/68 | HR 65 | Temp 96.8°F | Resp 18 | Wt 179.4 lb

## 2018-09-30 DIAGNOSIS — Z7982 Long term (current) use of aspirin: Secondary | ICD-10-CM

## 2018-09-30 DIAGNOSIS — Z85820 Personal history of malignant melanoma of skin: Secondary | ICD-10-CM

## 2018-09-30 DIAGNOSIS — I10 Essential (primary) hypertension: Secondary | ICD-10-CM | POA: Diagnosis not present

## 2018-09-30 DIAGNOSIS — N400 Enlarged prostate without lower urinary tract symptoms: Secondary | ICD-10-CM

## 2018-09-30 DIAGNOSIS — Z79899 Other long term (current) drug therapy: Secondary | ICD-10-CM | POA: Insufficient documentation

## 2018-09-30 DIAGNOSIS — K769 Liver disease, unspecified: Secondary | ICD-10-CM

## 2018-09-30 DIAGNOSIS — N281 Cyst of kidney, acquired: Secondary | ICD-10-CM

## 2018-09-30 DIAGNOSIS — D693 Immune thrombocytopenic purpura: Secondary | ICD-10-CM | POA: Diagnosis not present

## 2018-09-30 LAB — CBC WITH DIFFERENTIAL/PLATELET
Abs Immature Granulocytes: 0.09 10*3/uL — ABNORMAL HIGH (ref 0.00–0.07)
BASOS ABS: 0.1 10*3/uL (ref 0.0–0.1)
Basophils Relative: 1 %
EOS ABS: 0.3 10*3/uL (ref 0.0–0.5)
EOS PCT: 3 %
HEMATOCRIT: 41.6 % (ref 39.0–52.0)
Hemoglobin: 13.2 g/dL (ref 13.0–17.0)
IMMATURE GRANULOCYTES: 1 %
LYMPHS ABS: 1.1 10*3/uL (ref 0.7–4.0)
Lymphocytes Relative: 9 %
MCH: 28.8 pg (ref 26.0–34.0)
MCHC: 31.7 g/dL (ref 30.0–36.0)
MCV: 90.8 fL (ref 80.0–100.0)
MONOS PCT: 9 %
Monocytes Absolute: 1 10*3/uL (ref 0.1–1.0)
NRBC: 0 % (ref 0.0–0.2)
Neutro Abs: 9.1 10*3/uL — ABNORMAL HIGH (ref 1.7–7.7)
Neutrophils Relative %: 77 %
Platelets: 72 10*3/uL — ABNORMAL LOW (ref 150–400)
RBC: 4.58 MIL/uL (ref 4.22–5.81)
RDW: 13.7 % (ref 11.5–15.5)
WBC: 11.6 10*3/uL — ABNORMAL HIGH (ref 4.0–10.5)

## 2018-09-30 LAB — COMPREHENSIVE METABOLIC PANEL
ALK PHOS: 50 U/L (ref 38–126)
ALT: 14 U/L (ref 0–44)
AST: 15 U/L (ref 15–41)
Albumin: 3.8 g/dL (ref 3.5–5.0)
Anion gap: 7 (ref 5–15)
BILIRUBIN TOTAL: 0.5 mg/dL (ref 0.3–1.2)
BUN: 15 mg/dL (ref 8–23)
CALCIUM: 8.8 mg/dL — AB (ref 8.9–10.3)
CO2: 26 mmol/L (ref 22–32)
CREATININE: 1.02 mg/dL (ref 0.61–1.24)
Chloride: 111 mmol/L (ref 98–111)
GFR calc Af Amer: >60 mL/min (ref >60)
Glucose, Bld: 97 mg/dL (ref 70–99)
POTASSIUM: 5 mmol/L (ref 3.5–5.1)
Sodium: 144 mmol/L (ref 135–145)
Total Protein: 6.3 g/dL — ABNORMAL LOW (ref 6.5–8.1)

## 2018-09-30 LAB — PSA: Prostatic Specific Antigen: 0.67 ng/mL (ref 0.00–4.00)

## 2018-09-30 NOTE — Assessment & Plan Note (Addendum)
#  Chronic ITP-status post Rituxan October 2017; no significant improvement platelets stable around 70s.  Asymptomatic.  Continue surveillance.  #Incidental finding March 2019 10 mm hypervascular lesion unclear etiology; discussed the potential differential diagnosis-recommend repeat CT scan abdomen pelvis.  Patient prefers in 2 months.  #Bilateral kidney cysts; with 7 mm left inferior kidney exophytic complex cyst/Bosniak 2 monitor for now.  Stable.  Await above CT scan.  # DISPOSITION:  # follow up in 2 months with MD/labs- cbc/bmp; CT Ab/Pelvis few days prior-Dr.B

## 2018-09-30 NOTE — Progress Notes (Signed)
Here for follow up. Per pt " feeling good " no c/o

## 2018-09-30 NOTE — Progress Notes (Signed)
[facility] OFFICE PROGRESS NOTE  Patient Care Team: [name], MD as PCP - General (Internal Medicine)   SUMMARY OF ONCOLOGIC HISTORY:  2012- CHRONIC ITP-[ BMBx; 2012- hypercellular 60%; megakaryocytes/no dyspoiesis; FISH- Neg; Dr.Pandit]; [place]- Abdo-Neg for spleen/liver; HIV/hepatitis-NEG; OCT 2017- Rituxan weekly x4  # July 2017- Melanoma s/p excision [? Stage; Dr.Dasher]  INTERVAL HISTORY:  83 year old male patient with a history of ITP currently on surveillance.   In the interim patient underwent lithotripsy of his bladder stone.  Postprocedure complicated by hematuria.  Currently resolved.    Review of Systems  Constitutional: Negative.  Negative for chills, diaphoresis, fever, malaise/fatigue and weight loss.  HENT: Negative.  Negative for nosebleeds and sore throat.   Eyes: Negative.  Negative for double vision.  Respiratory: Negative.  Negative for cough, hemoptysis, sputum production, shortness of breath and wheezing.   Cardiovascular: Negative.  Negative for chest pain, palpitations, orthopnea and leg swelling.  Gastrointestinal: Negative.  Negative for abdominal pain, blood in stool, constipation, diarrhea, heartburn, melena, nausea and vomiting.  Genitourinary: Negative.  Negative for dysuria, frequency and urgency.  Musculoskeletal: Negative.  Negative for back pain and joint pain.  Skin: Negative.  Negative for itching and rash.  Neurological: Negative.  Negative for dizziness, tingling, focal weakness, weakness and headaches.  Endo/Heme/Allergies: Bruises/bleeds easily.  Psychiatric/Behavioral: Negative.  Negative for depression. The patient is not nervous/anxious and does not have insomnia.      PAST MEDICAL HISTORY :  Past Medical History:  Diagnosis Date  . CAD (coronary artery disease)   . Cancer ([place])    melanoma  . CHF (congestive heart failure) ([place])    2017 with pneumonia  . Colon polyps   . GERD (gastroesophageal reflux  disease)   . Gout   . Hyperlipidemia   . Hypertension   . IDA (iron deficiency anemia)   . Kidney stones   . MI (myocardial infarction) ([place]) 2004  . Mild anemia   . Thrombocytopenia ([place])     PAST SURGICAL HISTORY :   Past Surgical History:  Procedure Laterality Date  . BLADDER SURGERY    . CARDIAC CATHETERIZATION    . CORONARY ARTERY BYPASS GRAFT    . CYSTOSCOPY WITH LITHOLAPAXY N/A 07/05/2018   Procedure: CYSTOSCOPY WITH LITHOLAPAXY;  Surgeon: [name], MD;  Location: [facility] ORS;  Service: Urology;  Laterality: N/A;  . HOLMIUM LASER APPLICATION N/A 01/30/4655   Procedure: HOLMIUM LASER APPLICATION;  Surgeon: [name], MD;  Location: [facility] ORS;  Service: Urology;  Laterality: N/A;  . INGUINAL HERNIA REPAIR Right   . LASER ABLATION     x 2 prostatic hypertrophy  . TONSILLECTOMY      FAMILY HISTORY :   Family History  Problem Relation Age of Onset  . Breast cancer Mother   . Diabetes Father   . Heart disease Father   . Heart disease Brother   . Heart disease Brother   . Prostate cancer Neg Hx   . Bladder Cancer Neg Hx   . Kidney cancer Neg Hx     SOCIAL HISTORY:   Social History   Tobacco Use  . Smoking status: Never Smoker  . Smokeless tobacco: Never Used  Substance Use Topics  . Alcohol use: No    Alcohol/week: 0.0 standard drinks  . Drug use: No    ALLERGIES:  is allergic to penicillins.  MEDICATIONS:  Current Outpatient Medications  Medication Sig Dispense Refill  . aspirin EC 81 MG tablet  Take 81 mg by mouth every evening.    Marland Kitchen atorvastatin (LIPITOR) 10 MG tablet Take 5 mg by mouth daily at 2 PM.     . CARTIA XT 240 MG 24 hr capsule Take 240 mg by mouth daily.  3  . lisinopril (PRINIVIL,ZESTRIL) 5 MG tablet Take 1 tablet daily by mouth.    . loratadine (CLARITIN) 10 MG tablet Take 10 mg by mouth daily.     . metoprolol tartrate (LOPRESSOR) 25 MG tablet Take 25 mg by mouth 2 (two) times daily.  3  . Multiple Vitamins-Minerals (MULTIVITAMIN  WITH MINERALS) tablet Take 1 tablet by mouth daily.    . Multiple Vitamins-Minerals (PRESERVISION AREDS 2 PO) Take 1 tablet by mouth 2 (two) times daily.    . Omega-3 Fatty Acids (FISH OIL) 1200 MG CAPS Take 1,200 mg by mouth daily.    Marland Kitchen omeprazole (PRILOSEC) 20 MG capsule Take 20 mg by mouth daily.    . vitamin B-12 (CYANOCOBALAMIN) 500 MCG tablet Take 500 mcg by mouth daily.    Marland Kitchen acetaminophen (TYLENOL) 500 MG tablet Take 500 mg by mouth every 6 (six) hours as needed.    Marland Kitchen azelastine (ASTELIN) 0.1 % nasal spray Place 1 spray into both nostrils 2 (two) times daily as needed for allergies. Use in each nostril as directed    . oxybutynin (DITROPAN) 5 MG tablet 1 tab Bid prn frequency,urgency, bladder spasm (Patient not taking: Reported on 09/30/2018) 30 tablet 0   No current facility-administered medications for this visit.     PHYSICAL EXAMINATION:   BP (!) 148/68 (BP Location: Left Arm, Patient Position: Sitting)   Pulse 65   Temp (!) 96.8 F (36 C) (Tympanic)   Resp 18   Wt 179 lb 6.4 oz (81.4 kg)   BMI 25.02 kg/m   Filed Weights   09/30/18 1039  Weight: 179 lb 6.4 oz (81.4 kg)    Physical Exam  Constitutional: He is oriented to person, place, and time and well-developed, well-nourished, and in no distress.  Accompanied by his wife.  HENT:  Head: Normocephalic and atraumatic.  Mouth/Throat: Oropharynx is clear and moist. No oropharyngeal exudate.  Eyes: Pupils are equal, round, and reactive to light.  Neck: Normal range of motion. Neck supple.  Cardiovascular: Normal rate and regular rhythm.  Pulmonary/Chest: No respiratory distress. He has no wheezes.  Abdominal: Soft. Bowel sounds are normal. He exhibits no distension and no mass. There is no tenderness. There is no rebound and no guarding.  Musculoskeletal: Normal range of motion. He exhibits no edema or tenderness.  Neurological: He is alert and oriented to person, place, and time.  Skin: Skin is warm.  Psychiatric:  Affect normal.    LABORATORY DATA:  I have reviewed the data as listed    Component Value Date/Time   NA 144 09/30/2018 1004   NA 140 12/17/2012 1229   K 5.0 09/30/2018 1004   K 4.4 12/17/2012 1229   CL 111 09/30/2018 1004   CL 106 12/17/2012 1229   CO2 26 09/30/2018 1004   CO2 26 12/17/2012 1229   GLUCOSE 97 09/30/2018 1004   GLUCOSE 77 12/17/2012 1229   BUN 15 09/30/2018 1004   BUN 19 (H) 12/17/2012 1229   CREATININE 1.02 09/30/2018 1004   CREATININE 1.05 12/17/2012 1229   CALCIUM 8.8 (L) 09/30/2018 1004   CALCIUM 8.4 (L) 12/17/2012 1229   PROT 6.3 (L) 09/30/2018 1004   ALBUMIN 3.8 09/30/2018 1004   AST 15 09/30/2018  1004   ALT 14 09/30/2018 1004   ALKPHOS 50 09/30/2018 1004   BILITOT 0.5 09/30/2018 1004   GFRNONAA >60 09/30/2018 1004   GFRNONAA >60 12/17/2012 1229   GFRAA >60 09/30/2018 1004   GFRAA >60 12/17/2012 1229    No results found for: SPEP, UPEP  Lab Results  Component Value Date   WBC 11.6 (H) 09/30/2018   NEUTROABS 9.1 (H) 09/30/2018   HGB 13.2 09/30/2018   HCT 41.6 09/30/2018   MCV 90.8 09/30/2018   PLT 72 (L) 09/30/2018      Chemistry      Component Value Date/Time   NA 144 09/30/2018 1004   NA 140 12/17/2012 1229   K 5.0 09/30/2018 1004   K 4.4 12/17/2012 1229   CL 111 09/30/2018 1004   CL 106 12/17/2012 1229   CO2 26 09/30/2018 1004   CO2 26 12/17/2012 1229   BUN 15 09/30/2018 1004   BUN 19 (H) 12/17/2012 1229   CREATININE 1.02 09/30/2018 1004   CREATININE 1.05 12/17/2012 1229      Component Value Date/Time   CALCIUM 8.8 (L) 09/30/2018 1004   CALCIUM 8.4 (L) 12/17/2012 1229   ALKPHOS 50 09/30/2018 1004   AST 15 09/30/2018 1004   ALT 14 09/30/2018 1004   BILITOT 0.5 09/30/2018 1004       RADIOGRAPHIC STUDIES: I have personally reviewed the radiological images as listed and agreed with the findings in the report. No results found.   ASSESSMENT & PLAN:   Idiopathic thrombocytopenic purpura [facility]) #Chronic ITP-status post  Rituxan October 2017; no significant improvement platelets stable around 70s.  Asymptomatic.  Continue surveillance.  #Incidental finding March 2019 10 mm hypervascular lesion unclear etiology; discussed the potential differential diagnosis-recommend repeat CT scan abdomen pelvis.  Patient prefers in 2 months.  #Bilateral kidney cysts; with 7 mm left inferior kidney exophytic complex cyst/Bosniak 2 monitor for now.  Stable.  Await above CT scan.  # DISPOSITION:  # follow up in 2 months with MD/labs- cbc/bmp; CT Ab/Pelvis few days prior-Dr.B      Cammie Sickle, MD 09/30/2018 12:15 PM

## 2018-10-16 DIAGNOSIS — Z85828 Personal history of other malignant neoplasm of skin: Secondary | ICD-10-CM | POA: Diagnosis not present

## 2018-10-16 DIAGNOSIS — D2272 Melanocytic nevi of left lower limb, including hip: Secondary | ICD-10-CM | POA: Diagnosis not present

## 2018-10-16 DIAGNOSIS — X32XXXA Exposure to sunlight, initial encounter: Secondary | ICD-10-CM | POA: Diagnosis not present

## 2018-10-16 DIAGNOSIS — L570 Actinic keratosis: Secondary | ICD-10-CM | POA: Diagnosis not present

## 2018-10-16 DIAGNOSIS — L298 Other pruritus: Secondary | ICD-10-CM | POA: Diagnosis not present

## 2018-10-16 DIAGNOSIS — Z85820 Personal history of malignant melanoma of skin: Secondary | ICD-10-CM | POA: Diagnosis not present

## 2018-10-16 DIAGNOSIS — Q828 Other specified congenital malformations of skin: Secondary | ICD-10-CM | POA: Diagnosis not present

## 2018-10-16 DIAGNOSIS — D2262 Melanocytic nevi of left upper limb, including shoulder: Secondary | ICD-10-CM | POA: Diagnosis not present

## 2018-10-16 DIAGNOSIS — D485 Neoplasm of uncertain behavior of skin: Secondary | ICD-10-CM | POA: Diagnosis not present

## 2018-10-18 DIAGNOSIS — J069 Acute upper respiratory infection, unspecified: Secondary | ICD-10-CM | POA: Diagnosis not present

## 2018-11-25 ENCOUNTER — Other Ambulatory Visit: Payer: Self-pay

## 2018-11-25 DIAGNOSIS — D472 Monoclonal gammopathy: Secondary | ICD-10-CM

## 2018-11-29 ENCOUNTER — Ambulatory Visit
Admission: RE | Admit: 2018-11-29 | Discharge: 2018-11-29 | Disposition: A | Discharge status: Home / Self Care | Payer: PPO | Source: Ambulatory Visit | Attending: Internal Medicine | Admitting: Internal Medicine

## 2018-11-29 ENCOUNTER — Inpatient Hospital Stay: Payer: PPO | Attending: Internal Medicine

## 2018-11-29 DIAGNOSIS — Z85820 Personal history of malignant melanoma of skin: Secondary | ICD-10-CM | POA: Diagnosis not present

## 2018-11-29 DIAGNOSIS — K769 Liver disease, unspecified: Secondary | ICD-10-CM

## 2018-11-29 DIAGNOSIS — I509 Heart failure, unspecified: Secondary | ICD-10-CM | POA: Insufficient documentation

## 2018-11-29 DIAGNOSIS — D693 Immune thrombocytopenic purpura: Secondary | ICD-10-CM | POA: Diagnosis not present

## 2018-11-29 DIAGNOSIS — Z79899 Other long term (current) drug therapy: Secondary | ICD-10-CM | POA: Diagnosis not present

## 2018-11-29 DIAGNOSIS — Z7982 Long term (current) use of aspirin: Secondary | ICD-10-CM | POA: Diagnosis not present

## 2018-11-29 DIAGNOSIS — E785 Hyperlipidemia, unspecified: Secondary | ICD-10-CM | POA: Diagnosis not present

## 2018-11-29 DIAGNOSIS — I110 Hypertensive heart disease with heart failure: Secondary | ICD-10-CM | POA: Diagnosis not present

## 2018-11-29 DIAGNOSIS — R040 Epistaxis: Secondary | ICD-10-CM | POA: Insufficient documentation

## 2018-11-29 DIAGNOSIS — I252 Old myocardial infarction: Secondary | ICD-10-CM | POA: Insufficient documentation

## 2018-11-29 DIAGNOSIS — K219 Gastro-esophageal reflux disease without esophagitis: Secondary | ICD-10-CM | POA: Diagnosis not present

## 2018-11-29 DIAGNOSIS — D472 Monoclonal gammopathy: Secondary | ICD-10-CM

## 2018-11-29 DIAGNOSIS — Z951 Presence of aortocoronary bypass graft: Secondary | ICD-10-CM | POA: Diagnosis not present

## 2018-11-29 DIAGNOSIS — I2510 Atherosclerotic heart disease of native coronary artery without angina pectoris: Secondary | ICD-10-CM | POA: Diagnosis not present

## 2018-11-29 DIAGNOSIS — N281 Cyst of kidney, acquired: Secondary | ICD-10-CM | POA: Diagnosis not present

## 2018-11-29 LAB — COMPREHENSIVE METABOLIC PANEL
ALT: 13 U/L (ref 0–44)
AST: 16 U/L (ref 15–41)
Albumin: 3.9 g/dL (ref 3.5–5.0)
Alkaline Phosphatase: 50 U/L (ref 38–126)
Anion gap: 9 (ref 5–15)
BILIRUBIN TOTAL: 0.6 mg/dL (ref 0.3–1.2)
BUN: 21 mg/dL (ref 8–23)
CO2: 26 mmol/L (ref 22–32)
CREATININE: 0.92 mg/dL (ref 0.61–1.24)
Calcium: 9.1 mg/dL (ref 8.9–10.3)
Chloride: 108 mmol/L (ref 98–111)
GFR calc Af Amer: >60 mL/min (ref >60)
Glucose, Bld: 104 mg/dL — ABNORMAL HIGH (ref 70–99)
Potassium: 4.3 mmol/L (ref 3.5–5.1)
Sodium: 143 mmol/L (ref 135–145)
TOTAL PROTEIN: 6.8 g/dL (ref 6.5–8.1)

## 2018-11-29 LAB — CBC WITH DIFFERENTIAL/PLATELET
Abs Immature Granulocytes: 0.08 10*3/uL — ABNORMAL HIGH (ref 0.00–0.07)
BASOS PCT: 0 %
Basophils Absolute: 0.1 10*3/uL (ref 0.0–0.1)
EOS PCT: 3 %
Eosinophils Absolute: 0.3 10*3/uL (ref 0.0–0.5)
HCT: 41.6 % (ref 39.0–52.0)
Hemoglobin: 13.4 g/dL (ref 13.0–17.0)
Immature Granulocytes: 1 %
Lymphocytes Relative: 9 %
Lymphs Abs: 1.1 10*3/uL (ref 0.7–4.0)
MCH: 28.5 pg (ref 26.0–34.0)
MCHC: 32.2 g/dL (ref 30.0–36.0)
MCV: 88.5 fL (ref 80.0–100.0)
MONO ABS: 0.9 10*3/uL (ref 0.1–1.0)
Monocytes Relative: 8 %
Neutro Abs: 9.5 10*3/uL — ABNORMAL HIGH (ref 1.7–7.7)
Neutrophils Relative %: 79 %
Platelets: 64 10*3/uL — ABNORMAL LOW (ref 150–400)
RBC: 4.7 MIL/uL (ref 4.22–5.81)
RDW: 13.6 % (ref 11.5–15.5)
WBC: 12 10*3/uL — AB (ref 4.0–10.5)
nRBC: 0 % (ref 0.0–0.2)

## 2018-11-29 LAB — PSA: PROSTATIC SPECIFIC ANTIGEN: 0.61 ng/mL (ref 0.00–4.00)

## 2018-11-29 MED ORDER — IOHEXOL 300 MG/ML  SOLN
100.0000 mL | Freq: Once | INTRAMUSCULAR | Status: AC | PRN
  Administered 2018-11-29: 100 mL via INTRAVENOUS

## 2018-12-02 ENCOUNTER — Inpatient Hospital Stay (HOSPITAL_BASED_OUTPATIENT_CLINIC_OR_DEPARTMENT_OTHER): Payer: PPO | Admitting: Internal Medicine

## 2018-12-02 ENCOUNTER — Encounter: Payer: Self-pay | Admitting: Internal Medicine

## 2018-12-02 ENCOUNTER — Other Ambulatory Visit: Payer: Self-pay

## 2018-12-02 VITALS — BP 150/72 | HR 63 | Temp 97.7°F | Resp 20 | Ht 71.0 in | Wt 178.0 lb

## 2018-12-02 DIAGNOSIS — Z85820 Personal history of malignant melanoma of skin: Secondary | ICD-10-CM

## 2018-12-02 DIAGNOSIS — N281 Cyst of kidney, acquired: Secondary | ICD-10-CM | POA: Diagnosis not present

## 2018-12-02 DIAGNOSIS — I509 Heart failure, unspecified: Secondary | ICD-10-CM | POA: Diagnosis not present

## 2018-12-02 DIAGNOSIS — Z7982 Long term (current) use of aspirin: Secondary | ICD-10-CM

## 2018-12-02 DIAGNOSIS — K219 Gastro-esophageal reflux disease without esophagitis: Secondary | ICD-10-CM | POA: Diagnosis not present

## 2018-12-02 DIAGNOSIS — I2510 Atherosclerotic heart disease of native coronary artery without angina pectoris: Secondary | ICD-10-CM | POA: Diagnosis not present

## 2018-12-02 DIAGNOSIS — I110 Hypertensive heart disease with heart failure: Secondary | ICD-10-CM | POA: Diagnosis not present

## 2018-12-02 DIAGNOSIS — D693 Immune thrombocytopenic purpura: Secondary | ICD-10-CM

## 2018-12-02 DIAGNOSIS — Z79899 Other long term (current) drug therapy: Secondary | ICD-10-CM

## 2018-12-02 DIAGNOSIS — I252 Old myocardial infarction: Secondary | ICD-10-CM | POA: Diagnosis not present

## 2018-12-02 DIAGNOSIS — K769 Liver disease, unspecified: Secondary | ICD-10-CM

## 2018-12-02 DIAGNOSIS — Z951 Presence of aortocoronary bypass graft: Secondary | ICD-10-CM

## 2018-12-02 DIAGNOSIS — R040 Epistaxis: Secondary | ICD-10-CM | POA: Diagnosis not present

## 2018-12-02 DIAGNOSIS — E785 Hyperlipidemia, unspecified: Secondary | ICD-10-CM

## 2018-12-02 NOTE — Progress Notes (Signed)
[facility] OFFICE PROGRESS NOTE  Patient Care Team: [name], MD as PCP - General (Internal Medicine)   SUMMARY OF ONCOLOGIC HISTORY:  2012- CHRONIC ITP-[ BMBx; 2012- hypercellular 60%; megakaryocytes/no dyspoiesis; FISH- Neg; Dr.Pandit]; [place]- Abdo-Neg for spleen/liver; HIV/hepatitis-NEG; OCT 2017- Rituxan weekly x4  # July 2017- Melanoma s/p excision [? Stage; Dr.Dasher]; status post lithotripsy [Dr. Bernardo Heater; 2019]; 2019-CT scan incidental liver lesion; likely hemangioma [Jan 2020 CT scan]  INTERVAL HISTORY:  84 year old male patient with a history of ITP currently on surveillance/review there is a CT scan ordered for a liver lesion.  Patient denies any blood in urine or blood in stools.  Intermittent epistaxis early morning when blowing the nose.  Denies any easy bruising or bleeding gums.   Review of Systems  Constitutional: Negative.  Negative for chills, diaphoresis, fever, malaise/fatigue and weight loss.  HENT: Negative.  Negative for nosebleeds and sore throat.   Eyes: Negative.  Negative for double vision.  Respiratory: Negative.  Negative for cough, hemoptysis, sputum production, shortness of breath and wheezing.   Cardiovascular: Negative.  Negative for chest pain, palpitations, orthopnea and leg swelling.  Gastrointestinal: Negative.  Negative for abdominal pain, blood in stool, constipation, diarrhea, heartburn, melena, nausea and vomiting.  Genitourinary: Negative.  Negative for dysuria, frequency and urgency.  Musculoskeletal: Negative.  Negative for back pain and joint pain.  Skin: Negative.  Negative for itching and rash.  Neurological: Negative.  Negative for dizziness, tingling, focal weakness, weakness and headaches.  Endo/Heme/Allergies: Bruises/bleeds easily.  Psychiatric/Behavioral: Negative.  Negative for depression. The patient is not nervous/anxious and does not have insomnia.      PAST MEDICAL HISTORY :  Past Medical History:   Diagnosis Date  . CAD (coronary artery disease)   . Cancer ([place])    melanoma  . CHF (congestive heart failure) ([place])    2017 with pneumonia  . Colon polyps   . GERD (gastroesophageal reflux disease)   . Gout   . Hyperlipidemia   . Hypertension   . IDA (iron deficiency anemia)   . Kidney stones   . MI (myocardial infarction) ([place]) 2004  . Mild anemia   . Thrombocytopenia ([place])     PAST SURGICAL HISTORY :   Past Surgical History:  Procedure Laterality Date  . BLADDER SURGERY    . CARDIAC CATHETERIZATION    . CORONARY ARTERY BYPASS GRAFT    . CYSTOSCOPY WITH LITHOLAPAXY N/A 07/05/2018   Procedure: CYSTOSCOPY WITH LITHOLAPAXY;  Surgeon: [name], MD;  Location: [facility] ORS;  Service: Urology;  Laterality: N/A;  . HOLMIUM LASER APPLICATION N/A 02/25/6605   Procedure: HOLMIUM LASER APPLICATION;  Surgeon: [name], MD;  Location: [facility] ORS;  Service: Urology;  Laterality: N/A;  . INGUINAL HERNIA REPAIR Right   . LASER ABLATION     x 2 prostatic hypertrophy  . TONSILLECTOMY      FAMILY HISTORY :   Family History  Problem Relation Age of Onset  . Breast cancer Mother   . Diabetes Father   . Heart disease Father   . Heart disease Brother   . Heart disease Brother   . Prostate cancer Neg Hx   . Bladder Cancer Neg Hx   . Kidney cancer Neg Hx     SOCIAL HISTORY:   Social History   Tobacco Use  . Smoking status: Never Smoker  . Smokeless tobacco: Never Used  Substance Use Topics  . Alcohol use: No    Alcohol/week: 0.0  standard drinks  . Drug use: No    ALLERGIES:  is allergic to penicillins.  MEDICATIONS:  Current Outpatient Medications  Medication Sig Dispense Refill  . acetaminophen (TYLENOL) 500 MG tablet Take 500 mg by mouth every 6 (six) hours as needed.    Marland Kitchen aspirin EC 81 MG tablet Take 81 mg by mouth every evening.    Marland Kitchen atorvastatin (LIPITOR) 10 MG tablet Take 5 mg by mouth daily at 2 PM.     . azelastine (ASTELIN) 0.1 % nasal spray Place 1 spray  into both nostrils 2 (two) times daily as needed for allergies. Use in each nostril as directed    . CARTIA XT 240 MG 24 hr capsule Take 240 mg by mouth daily.  3  . lisinopril (PRINIVIL,ZESTRIL) 5 MG tablet Take 1 tablet daily by mouth.    . loratadine (CLARITIN) 10 MG tablet Take 10 mg by mouth daily.     . metoprolol tartrate (LOPRESSOR) 25 MG tablet Take 25 mg by mouth 2 (two) times daily.  3  . Multiple Vitamins-Minerals (MULTIVITAMIN WITH MINERALS) tablet Take 1 tablet by mouth daily.    . Multiple Vitamins-Minerals (PRESERVISION AREDS 2 PO) Take 1 tablet by mouth 2 (two) times daily.    . Omega-3 Fatty Acids (FISH OIL) 1200 MG CAPS Take 1,200 mg by mouth daily.    Marland Kitchen omeprazole (PRILOSEC) 20 MG capsule Take 20 mg by mouth daily.    Marland Kitchen oxybutynin (DITROPAN) 5 MG tablet 1 tab Bid prn frequency,urgency, bladder spasm (Patient not taking: Reported on 09/30/2018) 30 tablet 0  . vitamin B-12 (CYANOCOBALAMIN) 500 MCG tablet Take 500 mcg by mouth daily.     No current facility-administered medications for this visit.     PHYSICAL EXAMINATION:   BP (!) 150/72 (BP Location: Right Arm, Patient Position: Sitting)   Pulse 63   Temp 97.7 F (36.5 C) (Oral)   Resp 20   Ht 5\' 11"  (1.803 m)   Wt 178 lb (80.7 kg)   BMI 24.83 kg/m   Filed Weights   12/02/18 1053  Weight: 178 lb (80.7 kg)    Physical Exam  Constitutional: He is oriented to person, place, and time and well-developed, well-nourished, and in no distress.  Accompanied by his wife.  HENT:  Head: Normocephalic and atraumatic.  Mouth/Throat: Oropharynx is clear and moist. No oropharyngeal exudate.  Eyes: Pupils are equal, round, and reactive to light.  Neck: Normal range of motion. Neck supple.  Cardiovascular: Normal rate and regular rhythm.  Pulmonary/Chest: No respiratory distress. He has no wheezes.  Abdominal: Soft. Bowel sounds are normal. He exhibits no distension and no mass. There is no abdominal tenderness. There is no  rebound and no guarding.  Musculoskeletal: Normal range of motion.        General: No tenderness or edema.  Neurological: He is alert and oriented to person, place, and time.  Skin: Skin is warm.  Psychiatric: Affect normal.    LABORATORY DATA:  I have reviewed the data as listed    Component Value Date/Time   NA 143 11/29/2018 0910   NA 140 12/17/2012 1229   K 4.3 11/29/2018 0910   K 4.4 12/17/2012 1229   CL 108 11/29/2018 0910   CL 106 12/17/2012 1229   CO2 26 11/29/2018 0910   CO2 26 12/17/2012 1229   GLUCOSE 104 (H) 11/29/2018 0910   GLUCOSE 77 12/17/2012 1229   BUN 21 11/29/2018 0910   BUN 19 (H) 12/17/2012 1229  CREATININE 0.92 11/29/2018 0910   CREATININE 1.05 12/17/2012 1229   CALCIUM 9.1 11/29/2018 0910   CALCIUM 8.4 (L) 12/17/2012 1229   PROT 6.8 11/29/2018 0910   ALBUMIN 3.9 11/29/2018 0910   AST 16 11/29/2018 0910   ALT 13 11/29/2018 0910   ALKPHOS 50 11/29/2018 0910   BILITOT 0.6 11/29/2018 0910   GFRNONAA >60 11/29/2018 0910   GFRNONAA >60 12/17/2012 1229   GFRAA >60 11/29/2018 0910   GFRAA >60 12/17/2012 1229    No results found for: SPEP, UPEP  Lab Results  Component Value Date   WBC 12.0 (H) 11/29/2018   NEUTROABS 9.5 (H) 11/29/2018   HGB 13.4 11/29/2018   HCT 41.6 11/29/2018   MCV 88.5 11/29/2018   PLT 64 (L) 11/29/2018      Chemistry      Component Value Date/Time   NA 143 11/29/2018 0910   NA 140 12/17/2012 1229   K 4.3 11/29/2018 0910   K 4.4 12/17/2012 1229   CL 108 11/29/2018 0910   CL 106 12/17/2012 1229   CO2 26 11/29/2018 0910   CO2 26 12/17/2012 1229   BUN 21 11/29/2018 0910   BUN 19 (H) 12/17/2012 1229   CREATININE 0.92 11/29/2018 0910   CREATININE 1.05 12/17/2012 1229      Component Value Date/Time   CALCIUM 9.1 11/29/2018 0910   CALCIUM 8.4 (L) 12/17/2012 1229   ALKPHOS 50 11/29/2018 0910   AST 16 11/29/2018 0910   ALT 13 11/29/2018 0910   BILITOT 0.6 11/29/2018 0910       RADIOGRAPHIC STUDIES: I have  personally reviewed the radiological images as listed and agreed with the findings in the report. No results found.   ASSESSMENT & PLAN:   Idiopathic thrombocytopenic purpura ([place]) #Chronic ITP-platelets stable between 60-70,000.  Asymptomatic.  Continue surveillance.  Would recommend treatment with thrombopoietin stimulating agent if platelets less than 50 or patient is bleeding.  # Incidental finding March 2019 10 mm hypervascular lesion unclear etiology- CT A/p- NED; no imaging is recommened.   #Bilateral kidney cysts; with 7 mm left inferior kidney exophytic complex cyst/Bosniak1; CT stable. No further imaging is recommended.   # DISPOSITION:  # follow up inmid-Aug 2020 months with MD/labs- cbc/bmp-Dr.B      Cammie Sickle, MD 12/02/2018 2:17 PM

## 2018-12-02 NOTE — Assessment & Plan Note (Addendum)
#  Chronic ITP-platelets stable between 60-70,000.  Asymptomatic.  Continue surveillance.  Would recommend treatment with thrombopoietin stimulating agent if platelets less than 50 or patient is bleeding.  # Incidental finding March 2019 10 mm hypervascular lesion unclear etiology- CT A/p- NED; no imaging is recommened.   #Bilateral kidney cysts; with 7 mm left inferior kidney exophytic complex cyst/Bosniak1; CT stable. No further imaging is recommended.   # I reviewed the blood work- with the patient in detail; also reviewed the imaging independently [as summarized above]; and with the patient in detail.    # DISPOSITION:  # follow up inmid-Aug 2020 months with MD/labs- cbc/bmp-Dr.B

## 2018-12-05 DIAGNOSIS — H353132 Nonexudative age-related macular degeneration, bilateral, intermediate dry stage: Secondary | ICD-10-CM | POA: Diagnosis not present

## 2019-01-10 ENCOUNTER — Other Ambulatory Visit: Payer: Self-pay | Admitting: Radiology

## 2019-01-10 DIAGNOSIS — N210 Calculus in bladder: Secondary | ICD-10-CM

## 2019-02-05 DIAGNOSIS — D693 Immune thrombocytopenic purpura: Secondary | ICD-10-CM | POA: Diagnosis not present

## 2019-02-05 DIAGNOSIS — I700 Atherosclerosis of aorta: Secondary | ICD-10-CM | POA: Diagnosis not present

## 2019-02-05 DIAGNOSIS — I10 Essential (primary) hypertension: Secondary | ICD-10-CM | POA: Diagnosis not present

## 2019-02-05 DIAGNOSIS — D692 Other nonthrombocytopenic purpura: Secondary | ICD-10-CM | POA: Diagnosis not present

## 2019-02-05 DIAGNOSIS — M1000 Idiopathic gout, unspecified site: Secondary | ICD-10-CM | POA: Diagnosis not present

## 2019-02-05 DIAGNOSIS — I2510 Atherosclerotic heart disease of native coronary artery without angina pectoris: Secondary | ICD-10-CM | POA: Diagnosis not present

## 2019-02-05 DIAGNOSIS — E7849 Other hyperlipidemia: Secondary | ICD-10-CM | POA: Diagnosis not present

## 2019-02-12 DIAGNOSIS — I700 Atherosclerosis of aorta: Secondary | ICD-10-CM | POA: Diagnosis not present

## 2019-02-12 DIAGNOSIS — D693 Immune thrombocytopenic purpura: Secondary | ICD-10-CM | POA: Diagnosis not present

## 2019-02-12 DIAGNOSIS — D649 Anemia, unspecified: Secondary | ICD-10-CM | POA: Diagnosis not present

## 2019-02-12 DIAGNOSIS — Z85820 Personal history of malignant melanoma of skin: Secondary | ICD-10-CM | POA: Diagnosis not present

## 2019-02-12 DIAGNOSIS — I5022 Chronic systolic (congestive) heart failure: Secondary | ICD-10-CM | POA: Diagnosis not present

## 2019-02-12 DIAGNOSIS — E7849 Other hyperlipidemia: Secondary | ICD-10-CM | POA: Diagnosis not present

## 2019-02-12 DIAGNOSIS — I2510 Atherosclerotic heart disease of native coronary artery without angina pectoris: Secondary | ICD-10-CM | POA: Diagnosis not present

## 2019-02-12 DIAGNOSIS — K219 Gastro-esophageal reflux disease without esophagitis: Secondary | ICD-10-CM | POA: Diagnosis not present

## 2019-02-12 DIAGNOSIS — I480 Paroxysmal atrial fibrillation: Secondary | ICD-10-CM | POA: Diagnosis not present

## 2019-02-12 DIAGNOSIS — M1000 Idiopathic gout, unspecified site: Secondary | ICD-10-CM | POA: Diagnosis not present

## 2019-02-12 DIAGNOSIS — D692 Other nonthrombocytopenic purpura: Secondary | ICD-10-CM | POA: Diagnosis not present

## 2019-02-12 DIAGNOSIS — I10 Essential (primary) hypertension: Secondary | ICD-10-CM | POA: Diagnosis not present

## 2019-03-31 ENCOUNTER — Other Ambulatory Visit: Payer: PPO

## 2019-03-31 ENCOUNTER — Ambulatory Visit: Payer: PPO | Admitting: Internal Medicine

## 2019-04-17 DIAGNOSIS — D2262 Melanocytic nevi of left upper limb, including shoulder: Secondary | ICD-10-CM | POA: Diagnosis not present

## 2019-04-17 DIAGNOSIS — Z85820 Personal history of malignant melanoma of skin: Secondary | ICD-10-CM | POA: Diagnosis not present

## 2019-04-17 DIAGNOSIS — D225 Melanocytic nevi of trunk: Secondary | ICD-10-CM | POA: Diagnosis not present

## 2019-04-17 DIAGNOSIS — Z08 Encounter for follow-up examination after completed treatment for malignant neoplasm: Secondary | ICD-10-CM | POA: Diagnosis not present

## 2019-04-17 DIAGNOSIS — D2261 Melanocytic nevi of right upper limb, including shoulder: Secondary | ICD-10-CM | POA: Diagnosis not present

## 2019-04-17 DIAGNOSIS — D2272 Melanocytic nevi of left lower limb, including hip: Secondary | ICD-10-CM | POA: Diagnosis not present

## 2019-04-17 DIAGNOSIS — Z85828 Personal history of other malignant neoplasm of skin: Secondary | ICD-10-CM | POA: Diagnosis not present

## 2019-04-17 DIAGNOSIS — L570 Actinic keratosis: Secondary | ICD-10-CM | POA: Diagnosis not present

## 2019-04-17 DIAGNOSIS — X32XXXA Exposure to sunlight, initial encounter: Secondary | ICD-10-CM | POA: Diagnosis not present

## 2019-04-17 DIAGNOSIS — D2271 Melanocytic nevi of right lower limb, including hip: Secondary | ICD-10-CM | POA: Diagnosis not present

## 2019-07-01 ENCOUNTER — Ambulatory Visit
Admission: RE | Admit: 2019-07-01 | Discharge: 2019-07-01 | Disposition: A | Discharge status: Home / Self Care | Payer: PPO | Source: Ambulatory Visit | Attending: Urology | Admitting: Urology

## 2019-07-01 ENCOUNTER — Encounter: Payer: Self-pay | Admitting: Urology

## 2019-07-01 ENCOUNTER — Other Ambulatory Visit: Payer: Self-pay

## 2019-07-01 ENCOUNTER — Ambulatory Visit: Payer: PPO | Admitting: Urology

## 2019-07-01 VITALS — BP 160/70 | HR 70 | Ht 71.0 in | Wt 180.0 lb

## 2019-07-01 DIAGNOSIS — I10 Essential (primary) hypertension: Secondary | ICD-10-CM | POA: Insufficient documentation

## 2019-07-01 DIAGNOSIS — K219 Gastro-esophageal reflux disease without esophagitis: Secondary | ICD-10-CM | POA: Insufficient documentation

## 2019-07-01 DIAGNOSIS — D649 Anemia, unspecified: Secondary | ICD-10-CM | POA: Insufficient documentation

## 2019-07-01 DIAGNOSIS — N400 Enlarged prostate without lower urinary tract symptoms: Secondary | ICD-10-CM | POA: Diagnosis not present

## 2019-07-01 DIAGNOSIS — E785 Hyperlipidemia, unspecified: Secondary | ICD-10-CM | POA: Insufficient documentation

## 2019-07-01 DIAGNOSIS — N210 Calculus in bladder: Secondary | ICD-10-CM

## 2019-07-01 DIAGNOSIS — M109 Gout, unspecified: Secondary | ICD-10-CM | POA: Insufficient documentation

## 2019-07-01 DIAGNOSIS — Z87442 Personal history of urinary calculi: Secondary | ICD-10-CM

## 2019-07-01 DIAGNOSIS — I4891 Unspecified atrial fibrillation: Secondary | ICD-10-CM | POA: Insufficient documentation

## 2019-07-01 NOTE — Progress Notes (Signed)
07/01/2019 3:16 PM   Levi Figueroa 1935-03-27 [id number]  Referring provider: [name], MD [name],  [place] [address]  Chief Complaint  Patient presents with  . bladder stones    Urologic history:  1.  History of bladder calculus  -Status post cystolitholapaxy 4cm bladder calculus 8/19  2.  BPH  -History 2 prostate laser procedures by Dr. Yves Figueroa   HPI: 84 y.o. presents for follow-up visit.  He underwent cystolitholapaxy of a 4 cm bladder calculus and August 2019 and it does not look like he had a postop visit other than for his catheter removal.  He states he had no postoperative problems and has been voiding well.  Denies gross hematuria.  He did have a CT of the abdomen pelvis ordered January 2020 by oncology and the bladder was clear of calculi.  No abnormalities were noted.  He has no complaints today and states he is doing well.  KUB performed today was reviewed and there are no calcifications suspicious for urinary tract stones.   PMH: Past Medical History:  Diagnosis Date  . CAD (coronary artery disease)   . Cancer ([place])    melanoma  . CHF (congestive heart failure) ([place])    2017 with pneumonia  . Colon polyps   . GERD (gastroesophageal reflux disease)   . Gout   . Hyperlipidemia   . Hypertension   . IDA (iron deficiency anemia)   . Kidney stones   . MI (myocardial infarction) ([place]) 2004  . Mild anemia   . Thrombocytopenia [facility])     Surgical History: Past Surgical History:  Procedure Laterality Date  . BLADDER SURGERY    . CARDIAC CATHETERIZATION    . CORONARY ARTERY BYPASS GRAFT    . CYSTOSCOPY WITH LITHOLAPAXY N/A 07/05/2018   Procedure: CYSTOSCOPY WITH LITHOLAPAXY;  Surgeon: [name], MD;  Location: [facility] ORS;  Service: Urology;  Laterality: N/A;  . HOLMIUM LASER APPLICATION N/A 02/26/6383   Procedure: HOLMIUM LASER APPLICATION;  Surgeon: [name], MD;  Location: [facility] ORS;  Service:  Urology;  Laterality: N/A;  . INGUINAL HERNIA REPAIR Right   . LASER ABLATION     x 2 prostatic hypertrophy  . TONSILLECTOMY      Home Medications:  Allergies as of 07/01/2019      Reactions   Penicillins Swelling   Has patient had a PCN reaction causing immediate rash, facial/tongue/throat swelling, SOB or lightheadedness with hypotension: Yes Has patient had a PCN reaction causing severe rash involving mucus membranes or skin necrosis: No Has patient had a PCN reaction that required hospitalization No Has patient had a PCN reaction occurring within the last 10 years: No If all of the above answers are "NO", then may proceed with Cephalosporin use.      Medication List       Accurate as of July 01, 2019  3:16 PM. If you have any questions, ask your nurse or doctor.        STOP taking these medications   oxybutynin 5 MG tablet Commonly known as: DITROPAN Stopped by: [name], MD   vitamin B-12 500 MCG tablet Commonly known as: CYANOCOBALAMIN Stopped by: [name], MD     TAKE these medications   acetaminophen 500 MG tablet Commonly known as: TYLENOL Take 500 mg by mouth every 6 (six) hours as needed.   aspirin EC 81 MG tablet Take 81 mg by mouth every evening.  atorvastatin 10 MG tablet Commonly known as: LIPITOR Take 5 mg by mouth daily at 2 PM.   azelastine 0.1 % nasal spray Commonly known as: ASTELIN Place 1 spray into both nostrils 2 (two) times daily as needed for allergies. Use in each nostril as directed   Cartia XT 240 MG 24 hr capsule Generic drug: diltiazem Take 240 mg by mouth daily.   Fish Oil 1200 MG Caps Take 1,200 mg by mouth daily.   lisinopril 5 MG tablet Commonly known as: ZESTRIL Take 1 tablet daily by mouth.   loratadine 10 MG tablet Commonly known as: CLARITIN Take 10 mg by mouth daily.   metoprolol tartrate 25 MG tablet Commonly known as: LOPRESSOR Take 25 mg by mouth 2 (two) times daily.   multivitamin with  minerals tablet Take 1 tablet by mouth daily.   PRESERVISION AREDS 2 PO Take 1 tablet by mouth 2 (two) times daily.   omeprazole 20 MG capsule Commonly known as: PRILOSEC Take 20 mg by mouth daily.       Allergies:  Allergies  Allergen Reactions  . Penicillins Swelling    Has patient had a PCN reaction causing immediate rash, facial/tongue/throat swelling, SOB or lightheadedness with hypotension: Yes Has patient had a PCN reaction causing severe rash involving mucus membranes or skin necrosis: No Has patient had a PCN reaction that required hospitalization No Has patient had a PCN reaction occurring within the last 10 years: No If all of the above answers are "NO", then may proceed with Cephalosporin use.     Family History: Family History  Problem Relation Age of Onset  . Breast cancer Mother   . Diabetes Father   . Heart disease Father   . Heart disease Brother   . Heart disease Brother   . Prostate cancer Neg Hx   . Bladder Cancer Neg Hx   . Kidney cancer Neg Hx     Social History:  reports that he has never smoked. He has never used smokeless tobacco. He reports that he does not drink alcohol or use drugs.  ROS: UROLOGY Frequent Urination?: No Hard to postpone urination?: No Burning/pain with urination?: No Get up at night to urinate?: No Leakage of urine?: No Urine stream starts and stops?: No Trouble starting stream?: No Do you have to strain to urinate?: No Blood in urine?: No Urinary tract infection?: No Sexually transmitted disease?: No Injury to kidneys or bladder?: No Painful intercourse?: No Weak stream?: No Erection problems?: No Penile pain?: No  Gastrointestinal Nausea?: No Vomiting?: No Indigestion/heartburn?: No Diarrhea?: No Constipation?: No  Constitutional Fever: No Night sweats?: No Weight loss?: No Fatigue?: No  Skin Skin rash/lesions?: No Itching?: No  Eyes Blurred vision?: No Double vision?: No  Ears/Nose/Throat  Sore throat?: No Sinus problems?: No  Hematologic/Lymphatic Swollen glands?: No Easy bruising?: Yes  Cardiovascular Leg swelling?: No Chest pain?: No  Respiratory Cough?: No Shortness of breath?: No  Endocrine Excessive thirst?: No  Musculoskeletal Back pain?: No Joint pain?: No  Neurological Headaches?: No Dizziness?: No  Psychologic Depression?: No Anxiety?: No  Physical Exam: BP (!) 160/70 (BP Location: Left Arm, Patient Position: Sitting, Cuff Size: Normal)   Pulse 70   Ht 5\' 11"  (1.803 m)   Wt 180 lb (81.6 kg)   BMI 25.10 kg/m   Constitutional:  Alert and oriented, No acute distress. HEENT: [place] AT, moist mucus membranes.  Trachea midline, no masses. Cardiovascular: No clubbing, cyanosis, or edema. Respiratory: Normal respiratory effort, no increased  work of breathing. Skin: No rashes, bruises or suspicious lesions. Neurologic: Grossly intact, no focal deficits, moving all 4 extremities. Psychiatric: Normal mood and affect.  Assessment & Plan:   Doing well status post cystolitholapaxy.  Follow-up annually  [name], MD  [facility] [address] [place], [place] [address] [phone]

## 2019-07-05 ENCOUNTER — Encounter: Payer: Self-pay | Admitting: Urology

## 2019-07-05 DIAGNOSIS — Z87442 Personal history of urinary calculi: Secondary | ICD-10-CM | POA: Insufficient documentation

## 2019-07-11 ENCOUNTER — Other Ambulatory Visit: Payer: Self-pay

## 2019-07-11 DIAGNOSIS — D693 Immune thrombocytopenic purpura: Secondary | ICD-10-CM

## 2019-07-14 ENCOUNTER — Inpatient Hospital Stay (HOSPITAL_BASED_OUTPATIENT_CLINIC_OR_DEPARTMENT_OTHER): Payer: PPO | Admitting: Internal Medicine

## 2019-07-14 ENCOUNTER — Encounter: Payer: Self-pay | Admitting: Internal Medicine

## 2019-07-14 ENCOUNTER — Other Ambulatory Visit: Payer: Self-pay

## 2019-07-14 ENCOUNTER — Inpatient Hospital Stay: Payer: PPO | Attending: Internal Medicine

## 2019-07-14 DIAGNOSIS — Z951 Presence of aortocoronary bypass graft: Secondary | ICD-10-CM | POA: Diagnosis not present

## 2019-07-14 DIAGNOSIS — Z7982 Long term (current) use of aspirin: Secondary | ICD-10-CM | POA: Diagnosis not present

## 2019-07-14 DIAGNOSIS — E785 Hyperlipidemia, unspecified: Secondary | ICD-10-CM | POA: Insufficient documentation

## 2019-07-14 DIAGNOSIS — Z85820 Personal history of malignant melanoma of skin: Secondary | ICD-10-CM | POA: Insufficient documentation

## 2019-07-14 DIAGNOSIS — K219 Gastro-esophageal reflux disease without esophagitis: Secondary | ICD-10-CM | POA: Diagnosis not present

## 2019-07-14 DIAGNOSIS — I110 Hypertensive heart disease with heart failure: Secondary | ICD-10-CM | POA: Insufficient documentation

## 2019-07-14 DIAGNOSIS — D509 Iron deficiency anemia, unspecified: Secondary | ICD-10-CM | POA: Insufficient documentation

## 2019-07-14 DIAGNOSIS — I509 Heart failure, unspecified: Secondary | ICD-10-CM | POA: Insufficient documentation

## 2019-07-14 DIAGNOSIS — I2510 Atherosclerotic heart disease of native coronary artery without angina pectoris: Secondary | ICD-10-CM | POA: Insufficient documentation

## 2019-07-14 DIAGNOSIS — Z79899 Other long term (current) drug therapy: Secondary | ICD-10-CM | POA: Insufficient documentation

## 2019-07-14 DIAGNOSIS — I252 Old myocardial infarction: Secondary | ICD-10-CM | POA: Diagnosis not present

## 2019-07-14 DIAGNOSIS — D693 Immune thrombocytopenic purpura: Secondary | ICD-10-CM

## 2019-07-14 LAB — CBC WITH DIFFERENTIAL/PLATELET
Abs Immature Granulocytes: 0.07 10*3/uL (ref 0.00–0.07)
Basophils Absolute: 0.1 10*3/uL (ref 0.0–0.1)
Basophils Relative: 1 %
Eosinophils Absolute: 0.3 10*3/uL (ref 0.0–0.5)
Eosinophils Relative: 3 %
HCT: 39.7 % (ref 39.0–52.0)
Hemoglobin: 13.2 g/dL (ref 13.0–17.0)
Immature Granulocytes: 1 %
Lymphocytes Relative: 9 %
Lymphs Abs: 1.1 10*3/uL (ref 0.7–4.0)
MCH: 29.2 pg (ref 26.0–34.0)
MCHC: 33.2 g/dL (ref 30.0–36.0)
MCV: 87.8 fL (ref 80.0–100.0)
Monocytes Absolute: 0.9 10*3/uL (ref 0.1–1.0)
Monocytes Relative: 7 %
Neutro Abs: 9.4 10*3/uL — ABNORMAL HIGH (ref 1.7–7.7)
Neutrophils Relative %: 79 %
Platelets: 71 10*3/uL — ABNORMAL LOW (ref 150–400)
RBC: 4.52 MIL/uL (ref 4.22–5.81)
RDW: 14.5 % (ref 11.5–15.5)
WBC: 11.8 10*3/uL — ABNORMAL HIGH (ref 4.0–10.5)
nRBC: 0 % (ref 0.0–0.2)

## 2019-07-14 LAB — BASIC METABOLIC PANEL
Anion gap: 6 (ref 5–15)
BUN: 15 mg/dL (ref 8–23)
CO2: 26 mmol/L (ref 22–32)
Calcium: 8.4 mg/dL — ABNORMAL LOW (ref 8.9–10.3)
Chloride: 111 mmol/L (ref 98–111)
Creatinine, Ser: 0.92 mg/dL (ref 0.61–1.24)
GFR calc Af Amer: >60 mL/min (ref >60)
GFR calc non Af Amer: >60 mL/min (ref >60)
Glucose, Bld: 109 mg/dL — ABNORMAL HIGH (ref 70–99)
Potassium: 3.8 mmol/L (ref 3.5–5.1)
Sodium: 143 mmol/L (ref 135–145)

## 2019-07-14 NOTE — Progress Notes (Signed)
Patient does not offer any problems today.  

## 2019-07-14 NOTE — Patient Instructions (Signed)
#  Recommend vitamin D-2 1000 units/per day.

## 2019-07-14 NOTE — Assessment & Plan Note (Addendum)
#  Chronic ITP-platelets stable between 60-70,000.  Asymptomatic.  Platelets today are 71,000 continue surveillance.    Would recommend treatment with thrombopoietin stimulating agent if platelets less than 50 or patient is bleeding.  # Hypocalcemia- recommend vit D1000 Units/day.   # melanoma [2 years] status post excision-early stage.  Question basal cell carcinoma left cheek awaiting evaluation with Dr. Evorn Gong.  # DISPOSITION:  # follow up in 6 months with MD/labs- cbc/bmp-Dr.B

## 2019-07-14 NOTE — Progress Notes (Signed)
[place] OFFICE PROGRESS NOTE  Patient Care Team: [name], MD as PCP - General (Internal Medicine)   SUMMARY OF ONCOLOGIC HISTORY:  2012- CHRONIC ITP-[ BMBx; 2012- hypercellular 60%; megakaryocytes/no dyspoiesis; FISH- Neg; Dr.Pandit]; [place]- Abdo-Neg for spleen/liver; HIV/hepatitis-NEG; OCT 2017- Rituxan weekly x4  # July 2017- Melanoma s/p excision [? Stage; Dr.Dasher]; status post lithotripsy [Dr. Bernardo Heater; 2019]; 2019-CT scan incidental liver lesion; likely hemangioma [Jan 2020 CT scan]  INTERVAL HISTORY:  84 year old male patient with a history of ITP currently on surveillance is here for follow-up.  Patient denies any blood in stools or black or stools.  Denies any blood in urine.  No epistaxis.  No gum bleeding.  Nurse to have a small lesion on his left cheek.  Awaiting to be evaluated by Dr. Evorn Gong from dermatology  Review of Systems  Constitutional: Negative.  Negative for chills, diaphoresis, fever, malaise/fatigue and weight loss.  HENT: Negative.  Negative for nosebleeds and sore throat.   Eyes: Negative.  Negative for double vision.  Respiratory: Negative.  Negative for cough, hemoptysis, sputum production, shortness of breath and wheezing.   Cardiovascular: Negative.  Negative for chest pain, palpitations, orthopnea and leg swelling.  Gastrointestinal: Negative.  Negative for abdominal pain, blood in stool, constipation, diarrhea, heartburn, melena, nausea and vomiting.  Genitourinary: Negative.  Negative for dysuria, frequency and urgency.  Musculoskeletal: Negative.  Negative for back pain and joint pain.  Skin: Negative.  Negative for itching and rash.  Neurological: Negative.  Negative for dizziness, tingling, focal weakness, weakness and headaches.  Endo/Heme/Allergies: Bruises/bleeds easily.  Psychiatric/Behavioral: Negative.  Negative for depression. The patient is not nervous/anxious and does not have insomnia.      PAST MEDICAL  HISTORY :  Past Medical History:  Diagnosis Date  . CAD (coronary artery disease)   . Cancer ([place])    melanoma  . CHF (congestive heart failure) ([place])    2017 with pneumonia  . Colon polyps   . GERD (gastroesophageal reflux disease)   . Gout   . Hyperlipidemia   . Hypertension   . IDA (iron deficiency anemia)   . Kidney stones   . MI (myocardial infarction) ([place]) 2004  . Mild anemia   . Thrombocytopenia ([place])     PAST SURGICAL HISTORY :   Past Surgical History:  Procedure Laterality Date  . BLADDER SURGERY    . CARDIAC CATHETERIZATION    . CORONARY ARTERY BYPASS GRAFT    . CYSTOSCOPY WITH LITHOLAPAXY N/A 07/05/2018   Procedure: CYSTOSCOPY WITH LITHOLAPAXY;  Surgeon: [name], MD;  Location: [facility] ORS;  Service: Urology;  Laterality: N/A;  . HOLMIUM LASER APPLICATION N/A 04/28/2296   Procedure: HOLMIUM LASER APPLICATION;  Surgeon: [name], MD;  Location: [facility] ORS;  Service: Urology;  Laterality: N/A;  . INGUINAL HERNIA REPAIR Right   . LASER ABLATION     x 2 prostatic hypertrophy  . TONSILLECTOMY      FAMILY HISTORY :   Family History  Problem Relation Age of Onset  . Breast cancer Mother   . Diabetes Father   . Heart disease Father   . Heart disease Brother   . Heart disease Brother   . Prostate cancer Neg Hx   . Bladder Cancer Neg Hx   . Kidney cancer Neg Hx     SOCIAL HISTORY:   Social History   Tobacco Use  . Smoking status: Never Smoker  . Smokeless tobacco: Never Used  Substance Use Topics  .  Alcohol use: No    Alcohol/week: 0.0 standard drinks  . Drug use: No    ALLERGIES:  is allergic to penicillins.  MEDICATIONS:  Current Outpatient Medications  Medication Sig Dispense Refill  . acetaminophen (TYLENOL) 500 MG tablet Take 500 mg by mouth every 6 (six) hours as needed.    Marland Kitchen aspirin EC 81 MG tablet Take 81 mg by mouth every evening.    Marland Kitchen atorvastatin (LIPITOR) 10 MG tablet Take 5 mg by mouth daily at 2 PM.     . azelastine  (ASTELIN) 0.1 % nasal spray Place 1 spray into both nostrils 2 (two) times daily as needed for allergies. Use in each nostril as directed    . CARTIA XT 240 MG 24 hr capsule Take 240 mg by mouth daily.  3  . lisinopril (PRINIVIL,ZESTRIL) 5 MG tablet Take 1 tablet daily by mouth.    . loratadine (CLARITIN) 10 MG tablet Take 10 mg by mouth daily.     . metoprolol tartrate (LOPRESSOR) 25 MG tablet Take 25 mg by mouth 2 (two) times daily.  3  . Multiple Vitamins-Minerals (MULTIVITAMIN WITH MINERALS) tablet Take 1 tablet by mouth daily.    . Multiple Vitamins-Minerals (PRESERVISION AREDS 2 PO) Take 1 tablet by mouth 2 (two) times daily.    . Omega-3 Fatty Acids (FISH OIL) 1200 MG CAPS Take 1,200 mg by mouth daily.    Marland Kitchen omeprazole (PRILOSEC) 20 MG capsule Take 20 mg by mouth daily.     No current facility-administered medications for this visit.     PHYSICAL EXAMINATION:   BP (!) 174/69   Pulse (!) 59   Temp 98.2 F (36.8 C)   Resp 18   Wt 180 lb 9.6 oz (81.9 kg)   BMI 25.19 kg/m   Filed Weights   07/14/19 1042  Weight: 180 lb 9.6 oz (81.9 kg)    Physical Exam  Constitutional: He is oriented to person, place, and time and well-developed, well-nourished, and in no distress.  He is alone.  HENT:  Head: Normocephalic and atraumatic.  Mouth/Throat: Oropharynx is clear and moist. No oropharyngeal exudate.  Eyes: Pupils are equal, round, and reactive to light.  Neck: Normal range of motion. Neck supple.  Cardiovascular: Normal rate and regular rhythm.  Pulmonary/Chest: No respiratory distress. He has no wheezes.  Abdominal: Soft. Bowel sounds are normal. He exhibits no distension and no mass. There is no abdominal tenderness. There is no rebound and no guarding.  Musculoskeletal: Normal range of motion.        General: No tenderness or edema.  Neurological: He is alert and oriented to person, place, and time.  Skin: Skin is warm.  Psychiatric: Affect normal.    LABORATORY DATA:   I have reviewed the data as listed    Component Value Date/Time   NA 143 07/14/2019 1005   NA 140 12/17/2012 1229   K 3.8 07/14/2019 1005   K 4.4 12/17/2012 1229   CL 111 07/14/2019 1005   CL 106 12/17/2012 1229   CO2 26 07/14/2019 1005   CO2 26 12/17/2012 1229   GLUCOSE 109 (H) 07/14/2019 1005   GLUCOSE 77 12/17/2012 1229   BUN 15 07/14/2019 1005   BUN 19 (H) 12/17/2012 1229   CREATININE 0.92 07/14/2019 1005   CREATININE 1.05 12/17/2012 1229   CALCIUM 8.4 (L) 07/14/2019 1005   CALCIUM 8.4 (L) 12/17/2012 1229   PROT 6.8 11/29/2018 0910   ALBUMIN 3.9 11/29/2018 0910   AST 16  11/29/2018 0910   ALT 13 11/29/2018 0910   ALKPHOS 50 11/29/2018 0910   BILITOT 0.6 11/29/2018 0910   GFRNONAA >60 07/14/2019 1005   GFRNONAA >60 12/17/2012 1229   GFRAA >60 07/14/2019 1005   GFRAA >60 12/17/2012 1229    No results found for: SPEP, UPEP  Lab Results  Component Value Date   WBC 11.8 (H) 07/14/2019   NEUTROABS 9.4 (H) 07/14/2019   HGB 13.2 07/14/2019   HCT 39.7 07/14/2019   MCV 87.8 07/14/2019   PLT 71 (L) 07/14/2019      Chemistry      Component Value Date/Time   NA 143 07/14/2019 1005   NA 140 12/17/2012 1229   K 3.8 07/14/2019 1005   K 4.4 12/17/2012 1229   CL 111 07/14/2019 1005   CL 106 12/17/2012 1229   CO2 26 07/14/2019 1005   CO2 26 12/17/2012 1229   BUN 15 07/14/2019 1005   BUN 19 (H) 12/17/2012 1229   CREATININE 0.92 07/14/2019 1005   CREATININE 1.05 12/17/2012 1229      Component Value Date/Time   CALCIUM 8.4 (L) 07/14/2019 1005   CALCIUM 8.4 (L) 12/17/2012 1229   ALKPHOS 50 11/29/2018 0910   AST 16 11/29/2018 0910   ALT 13 11/29/2018 0910   BILITOT 0.6 11/29/2018 0910       RADIOGRAPHIC STUDIES: I have personally reviewed the radiological images as listed and agreed with the findings in the report. No results found.   ASSESSMENT & PLAN:   Idiopathic thrombocytopenic purpura ([place]) #Chronic ITP-platelets stable between 60-70,000.  Asymptomatic.   Platelets today are 71,000 continue surveillance.    Would recommend treatment with thrombopoietin stimulating agent if platelets less than 50 or patient is bleeding.  # Hypocalcemia- recommend vit D1000 Units/day.   # melanoma [2 years] status post excision-early stage.  Question basal cell carcinoma left cheek awaiting evaluation with Dr. Evorn Gong.  # DISPOSITION:  # follow up in 6 months with MD/labs- cbc/bmp-Dr.B      Cammie Sickle, MD 07/14/2019 11:45 AM

## 2019-07-16 ENCOUNTER — Ambulatory Visit: Payer: PPO | Admitting: Urology

## 2019-07-23 DIAGNOSIS — D485 Neoplasm of uncertain behavior of skin: Secondary | ICD-10-CM | POA: Diagnosis not present

## 2019-07-23 DIAGNOSIS — R208 Other disturbances of skin sensation: Secondary | ICD-10-CM | POA: Diagnosis not present

## 2019-07-23 DIAGNOSIS — C44329 Squamous cell carcinoma of skin of other parts of face: Secondary | ICD-10-CM | POA: Diagnosis not present

## 2019-07-30 ENCOUNTER — Other Ambulatory Visit: Payer: Self-pay

## 2019-07-30 DIAGNOSIS — R6889 Other general symptoms and signs: Secondary | ICD-10-CM | POA: Diagnosis not present

## 2019-07-30 DIAGNOSIS — Z20822 Contact with and (suspected) exposure to covid-19: Secondary | ICD-10-CM

## 2019-07-31 LAB — NOVEL CORONAVIRUS, NAA: SARS-CoV-2, NAA: NOT DETECTED

## 2019-08-06 ENCOUNTER — Other Ambulatory Visit: Payer: Self-pay

## 2019-08-06 DIAGNOSIS — R6889 Other general symptoms and signs: Secondary | ICD-10-CM | POA: Diagnosis not present

## 2019-08-06 DIAGNOSIS — Z20822 Contact with and (suspected) exposure to covid-19: Secondary | ICD-10-CM

## 2019-08-07 LAB — NOVEL CORONAVIRUS, NAA: SARS-CoV-2, NAA: DETECTED — AB

## 2019-08-25 DIAGNOSIS — C44329 Squamous cell carcinoma of skin of other parts of face: Secondary | ICD-10-CM | POA: Diagnosis not present

## 2019-08-26 DIAGNOSIS — I480 Paroxysmal atrial fibrillation: Secondary | ICD-10-CM | POA: Diagnosis not present

## 2019-08-26 DIAGNOSIS — M1000 Idiopathic gout, unspecified site: Secondary | ICD-10-CM | POA: Diagnosis not present

## 2019-08-26 DIAGNOSIS — E7849 Other hyperlipidemia: Secondary | ICD-10-CM | POA: Diagnosis not present

## 2019-08-26 DIAGNOSIS — I10 Essential (primary) hypertension: Secondary | ICD-10-CM | POA: Diagnosis not present

## 2019-08-26 DIAGNOSIS — D649 Anemia, unspecified: Secondary | ICD-10-CM | POA: Diagnosis not present

## 2019-08-26 DIAGNOSIS — I5022 Chronic systolic (congestive) heart failure: Secondary | ICD-10-CM | POA: Diagnosis not present

## 2019-09-02 DIAGNOSIS — E7849 Other hyperlipidemia: Secondary | ICD-10-CM | POA: Diagnosis not present

## 2019-09-02 DIAGNOSIS — Z0000 Encounter for general adult medical examination without abnormal findings: Secondary | ICD-10-CM | POA: Diagnosis not present

## 2019-09-02 DIAGNOSIS — I5022 Chronic systolic (congestive) heart failure: Secondary | ICD-10-CM | POA: Diagnosis not present

## 2019-09-02 DIAGNOSIS — I2510 Atherosclerotic heart disease of native coronary artery without angina pectoris: Secondary | ICD-10-CM | POA: Diagnosis not present

## 2019-09-02 DIAGNOSIS — D693 Immune thrombocytopenic purpura: Secondary | ICD-10-CM | POA: Diagnosis not present

## 2019-09-02 DIAGNOSIS — K219 Gastro-esophageal reflux disease without esophagitis: Secondary | ICD-10-CM | POA: Diagnosis not present

## 2019-09-02 DIAGNOSIS — Z8619 Personal history of other infectious and parasitic diseases: Secondary | ICD-10-CM | POA: Diagnosis not present

## 2019-09-02 DIAGNOSIS — I480 Paroxysmal atrial fibrillation: Secondary | ICD-10-CM | POA: Diagnosis not present

## 2019-09-02 DIAGNOSIS — M1000 Idiopathic gout, unspecified site: Secondary | ICD-10-CM | POA: Diagnosis not present

## 2019-09-02 DIAGNOSIS — D692 Other nonthrombocytopenic purpura: Secondary | ICD-10-CM | POA: Diagnosis not present

## 2019-09-02 DIAGNOSIS — I700 Atherosclerosis of aorta: Secondary | ICD-10-CM | POA: Diagnosis not present

## 2019-09-02 DIAGNOSIS — I10 Essential (primary) hypertension: Secondary | ICD-10-CM | POA: Diagnosis not present

## 2019-09-04 DIAGNOSIS — I10 Essential (primary) hypertension: Secondary | ICD-10-CM | POA: Diagnosis not present

## 2019-09-04 DIAGNOSIS — I700 Atherosclerosis of aorta: Secondary | ICD-10-CM | POA: Diagnosis not present

## 2019-09-04 DIAGNOSIS — I2510 Atherosclerotic heart disease of native coronary artery without angina pectoris: Secondary | ICD-10-CM | POA: Diagnosis not present

## 2019-09-04 DIAGNOSIS — I480 Paroxysmal atrial fibrillation: Secondary | ICD-10-CM | POA: Diagnosis not present

## 2019-09-08 DIAGNOSIS — C44329 Squamous cell carcinoma of skin of other parts of face: Secondary | ICD-10-CM | POA: Diagnosis not present

## 2019-10-02 DIAGNOSIS — E7849 Other hyperlipidemia: Secondary | ICD-10-CM | POA: Diagnosis not present

## 2019-10-02 DIAGNOSIS — I2510 Atherosclerotic heart disease of native coronary artery without angina pectoris: Secondary | ICD-10-CM | POA: Diagnosis not present

## 2019-10-02 DIAGNOSIS — I5022 Chronic systolic (congestive) heart failure: Secondary | ICD-10-CM | POA: Diagnosis not present

## 2019-10-02 DIAGNOSIS — I10 Essential (primary) hypertension: Secondary | ICD-10-CM | POA: Diagnosis not present

## 2019-10-02 DIAGNOSIS — I493 Ventricular premature depolarization: Secondary | ICD-10-CM | POA: Diagnosis not present

## 2019-10-02 DIAGNOSIS — I700 Atherosclerosis of aorta: Secondary | ICD-10-CM | POA: Diagnosis not present

## 2019-10-02 DIAGNOSIS — I480 Paroxysmal atrial fibrillation: Secondary | ICD-10-CM | POA: Diagnosis not present

## 2020-01-07 DIAGNOSIS — I5022 Chronic systolic (congestive) heart failure: Secondary | ICD-10-CM | POA: Diagnosis not present

## 2020-01-07 DIAGNOSIS — I480 Paroxysmal atrial fibrillation: Secondary | ICD-10-CM | POA: Diagnosis not present

## 2020-01-07 DIAGNOSIS — I2510 Atherosclerotic heart disease of native coronary artery without angina pectoris: Secondary | ICD-10-CM | POA: Diagnosis not present

## 2020-01-07 DIAGNOSIS — I493 Ventricular premature depolarization: Secondary | ICD-10-CM | POA: Diagnosis not present

## 2020-01-07 DIAGNOSIS — I10 Essential (primary) hypertension: Secondary | ICD-10-CM | POA: Diagnosis not present

## 2020-01-09 DIAGNOSIS — H353132 Nonexudative age-related macular degeneration, bilateral, intermediate dry stage: Secondary | ICD-10-CM | POA: Diagnosis not present

## 2020-01-12 ENCOUNTER — Inpatient Hospital Stay: Payer: PPO | Attending: Internal Medicine

## 2020-01-12 ENCOUNTER — Other Ambulatory Visit: Payer: Self-pay

## 2020-01-12 ENCOUNTER — Inpatient Hospital Stay (HOSPITAL_BASED_OUTPATIENT_CLINIC_OR_DEPARTMENT_OTHER): Payer: PPO | Admitting: Internal Medicine

## 2020-01-12 DIAGNOSIS — Z85820 Personal history of malignant melanoma of skin: Secondary | ICD-10-CM | POA: Insufficient documentation

## 2020-01-12 DIAGNOSIS — Z951 Presence of aortocoronary bypass graft: Secondary | ICD-10-CM | POA: Diagnosis not present

## 2020-01-12 DIAGNOSIS — K219 Gastro-esophageal reflux disease without esophagitis: Secondary | ICD-10-CM | POA: Diagnosis not present

## 2020-01-12 DIAGNOSIS — I252 Old myocardial infarction: Secondary | ICD-10-CM | POA: Insufficient documentation

## 2020-01-12 DIAGNOSIS — Z79899 Other long term (current) drug therapy: Secondary | ICD-10-CM | POA: Insufficient documentation

## 2020-01-12 DIAGNOSIS — I509 Heart failure, unspecified: Secondary | ICD-10-CM | POA: Diagnosis not present

## 2020-01-12 DIAGNOSIS — Z7982 Long term (current) use of aspirin: Secondary | ICD-10-CM | POA: Diagnosis not present

## 2020-01-12 DIAGNOSIS — D693 Immune thrombocytopenic purpura: Secondary | ICD-10-CM | POA: Diagnosis not present

## 2020-01-12 DIAGNOSIS — I110 Hypertensive heart disease with heart failure: Secondary | ICD-10-CM | POA: Diagnosis not present

## 2020-01-12 DIAGNOSIS — I2510 Atherosclerotic heart disease of native coronary artery without angina pectoris: Secondary | ICD-10-CM | POA: Diagnosis not present

## 2020-01-12 DIAGNOSIS — E785 Hyperlipidemia, unspecified: Secondary | ICD-10-CM | POA: Diagnosis not present

## 2020-01-12 LAB — BASIC METABOLIC PANEL
Anion gap: 9 (ref 5–15)
BUN: 22 mg/dL (ref 8–23)
CO2: 27 mmol/L (ref 22–32)
Calcium: 8.9 mg/dL (ref 8.9–10.3)
Chloride: 108 mmol/L (ref 98–111)
Creatinine, Ser: 1.1 mg/dL (ref 0.61–1.24)
GFR calc Af Amer: >60 mL/min (ref >60)
GFR calc non Af Amer: >60 mL/min (ref >60)
Glucose, Bld: 109 mg/dL — ABNORMAL HIGH (ref 70–99)
Potassium: 4.1 mmol/L (ref 3.5–5.1)
Sodium: 144 mmol/L (ref 135–145)

## 2020-01-12 LAB — CBC WITH DIFFERENTIAL/PLATELET
Abs Immature Granulocytes: 0.09 10*3/uL — ABNORMAL HIGH (ref 0.00–0.07)
Basophils Absolute: 0.1 10*3/uL (ref 0.0–0.1)
Basophils Relative: 1 %
Eosinophils Absolute: 0.3 10*3/uL (ref 0.0–0.5)
Eosinophils Relative: 2 %
HCT: 44.2 % (ref 39.0–52.0)
Hemoglobin: 13.8 g/dL (ref 13.0–17.0)
Immature Granulocytes: 1 %
Lymphocytes Relative: 8 %
Lymphs Abs: 1 10*3/uL (ref 0.7–4.0)
MCH: 28.3 pg (ref 26.0–34.0)
MCHC: 31.2 g/dL (ref 30.0–36.0)
MCV: 90.8 fL (ref 80.0–100.0)
Monocytes Absolute: 0.9 10*3/uL (ref 0.1–1.0)
Monocytes Relative: 7 %
Neutro Abs: 11.3 10*3/uL — ABNORMAL HIGH (ref 1.7–7.7)
Neutrophils Relative %: 81 %
Platelets: 76 10*3/uL — ABNORMAL LOW (ref 150–400)
RBC: 4.87 MIL/uL (ref 4.22–5.81)
RDW: 14.1 % (ref 11.5–15.5)
WBC: 13.7 10*3/uL — ABNORMAL HIGH (ref 4.0–10.5)
nRBC: 0 % (ref 0.0–0.2)

## 2020-01-12 NOTE — Assessment & Plan Note (Addendum)
#  Chronic ITP-platelets stable between 60-70,000.  Asymptomatic.  Platelets today are 76,000 continue surveillance. STABLE;  Would recommend treatment with thrombopoietin stimulating agent if platelets less than 50 or patient is bleeding.  # Hypocalcemia-STABE;  recommend vit D1000 Units/day.   # melanoma [2 years] status post excision-early stage.  Question basal cell carcinoma left cheek awaiting evaluation with Dr. Evorn Gong.  # DISPOSITION:  # follow up in 6 months with MD/labs- cbc/bmp-Dr.B

## 2020-01-12 NOTE — Progress Notes (Signed)
[place] OFFICE PROGRESS NOTE  Patient Care Team: [name], MD as PCP - General (Internal Medicine)   SUMMARY OF ONCOLOGIC HISTORY:  2012- CHRONIC ITP-[ BMBx; 2012- hypercellular 60%; megakaryocytes/no dyspoiesis; FISH- Neg; Dr.Pandit]; [place]- Abdo-Neg for spleen/liver; HIV/hepatitis-NEG; OCT 2017- Rituxan weekly x4  # July 2017- Melanoma s/p excision [? Stage; Dr.Dasher]; status post lithotripsy [Dr. Bernardo Heater; 2019]; 2019-CT scan incidental liver lesion; likely hemangioma [Jan 2020 CT scan]  INTERVAL HISTORY:  85 year old male patient with a history of ITP currently on surveillance is here for follow-up.  Patient had mild Covid infection not during hospitalization in September 2020.  Patient denies any blood in stools or black or stools.  Denies any abdominal pain.  Denies any complaint.  Denies any significant bruising. Review of Systems  Constitutional: Negative.  Negative for chills, diaphoresis, fever, malaise/fatigue and weight loss.  HENT: Negative.  Negative for nosebleeds and sore throat.   Eyes: Negative.  Negative for double vision.  Respiratory: Negative.  Negative for cough, hemoptysis, sputum production, shortness of breath and wheezing.   Cardiovascular: Negative.  Negative for chest pain, palpitations, orthopnea and leg swelling.  Gastrointestinal: Negative.  Negative for abdominal pain, blood in stool, constipation, diarrhea, heartburn, melena, nausea and vomiting.  Genitourinary: Negative.  Negative for dysuria, frequency and urgency.  Musculoskeletal: Negative.  Negative for back pain and joint pain.  Skin: Negative.  Negative for itching and rash.  Neurological: Negative.  Negative for dizziness, tingling, focal weakness, weakness and headaches.  Endo/Heme/Allergies: Bruises/bleeds easily.  Psychiatric/Behavioral: Negative.  Negative for depression. The patient is not nervous/anxious and does not have insomnia.      PAST MEDICAL HISTORY :   Past Medical History:  Diagnosis Date  . CAD (coronary artery disease)   . Cancer ([place])    melanoma  . CHF (congestive heart failure) ([place])    2017 with pneumonia  . Colon polyps   . GERD (gastroesophageal reflux disease)   . Gout   . Hyperlipidemia   . Hypertension   . IDA (iron deficiency anemia)   . Kidney stones   . MI (myocardial infarction) ([place]) 2004  . Mild anemia   . Thrombocytopenia ([place])     PAST SURGICAL HISTORY :   Past Surgical History:  Procedure Laterality Date  . BLADDER SURGERY    . CARDIAC CATHETERIZATION    . CORONARY ARTERY BYPASS GRAFT    . CYSTOSCOPY WITH LITHOLAPAXY N/A 07/05/2018   Procedure: CYSTOSCOPY WITH LITHOLAPAXY;  Surgeon: [name], MD;  Location: [facility] ORS;  Service: Urology;  Laterality: N/A;  . HOLMIUM LASER APPLICATION N/A [id number]   Procedure: HOLMIUM LASER APPLICATION;  Surgeon: [name], MD;  Location: [facility] ORS;  Service: Urology;  Laterality: N/A;  . INGUINAL HERNIA REPAIR Right   . LASER ABLATION     x 2 prostatic hypertrophy  . TONSILLECTOMY      FAMILY HISTORY :   Family History  Problem Relation Age of Onset  . Breast cancer Mother   . Diabetes Father   . Heart disease Father   . Heart disease Brother   . Heart disease Brother   . Prostate cancer Neg Hx   . Bladder Cancer Neg Hx   . Kidney cancer Neg Hx     SOCIAL HISTORY:   Social History   Tobacco Use  . Smoking status: Never Smoker  . Smokeless tobacco: Never Used  Substance Use Topics  . Alcohol use: No    Alcohol/week:  0.0 standard drinks  . Drug use: No    ALLERGIES:  is allergic to penicillins.  MEDICATIONS:  Current Outpatient Medications  Medication Sig Dispense Refill  . acetaminophen (TYLENOL) 500 MG tablet Take 500 mg by mouth every 6 (six) hours as needed.    Marland Kitchen aspirin EC 81 MG tablet Take 81 mg by mouth every evening.    Marland Kitchen atorvastatin (LIPITOR) 10 MG tablet Take 5 mg by mouth daily at 2 PM.     . azelastine (ASTELIN) 0.1 %  nasal spray Place 1 spray into both nostrils 2 (two) times daily as needed for allergies. Use in each nostril as directed    . CARTIA XT 240 MG 24 hr capsule Take 240 mg by mouth daily.  3  . lisinopril (PRINIVIL,ZESTRIL) 5 MG tablet Take 1 tablet daily by mouth.    . loratadine (CLARITIN) 10 MG tablet Take 10 mg by mouth daily.     . metoprolol tartrate (LOPRESSOR) 50 MG tablet Take 1 tablet by mouth in the morning and at bedtime.    . Multiple Vitamins-Minerals (MULTIVITAMIN WITH MINERALS) tablet Take 1 tablet by mouth daily.    . Multiple Vitamins-Minerals (PRESERVISION AREDS 2 PO) Take 1 tablet by mouth 2 (two) times daily.    . Omega-3 Fatty Acids (FISH OIL) 1200 MG CAPS Take 1,200 mg by mouth daily.    Marland Kitchen omeprazole (PRILOSEC) 20 MG capsule Take 20 mg by mouth daily.     No current facility-administered medications for this visit.    PHYSICAL EXAMINATION:   BP 137/63 (BP Location: Right Arm, Patient Position: Sitting)   Pulse 66   Temp (!) 96.9 F (36.1 C) (Tympanic)   Resp 20   Wt 179 lb 12.8 oz (81.6 kg)   BMI 25.08 kg/m   Filed Weights   01/12/20 0940  Weight: 179 lb 12.8 oz (81.6 kg)    Physical Exam  Constitutional: He is oriented to person, place, and time and well-developed, well-nourished, and in no distress.  He is alone.  HENT:  Head: Normocephalic and atraumatic.  Mouth/Throat: Oropharynx is clear and moist. No oropharyngeal exudate.  Eyes: Pupils are equal, round, and reactive to light.  Cardiovascular: Normal rate and regular rhythm.  Pulmonary/Chest: No respiratory distress. He has no wheezes.  Abdominal: Soft. Bowel sounds are normal. He exhibits no distension and no mass. There is no abdominal tenderness. There is no rebound and no guarding.  Musculoskeletal:        General: No tenderness or edema. Normal range of motion.     Cervical back: Normal range of motion and neck supple.  Neurological: He is alert and oriented to person, place, and time.   Skin: Skin is warm.  Psychiatric: Affect normal.    LABORATORY DATA:  I have reviewed the data as listed    Component Value Date/Time   NA 144 01/12/2020 0926   NA 140 12/17/2012 1229   K 4.1 01/12/2020 0926   K 4.4 12/17/2012 1229   CL 108 01/12/2020 0926   CL 106 12/17/2012 1229   CO2 27 01/12/2020 0926   CO2 26 12/17/2012 1229   GLUCOSE 109 (H) 01/12/2020 0926   GLUCOSE 77 12/17/2012 1229   BUN 22 01/12/2020 0926   BUN 19 (H) 12/17/2012 1229   CREATININE 1.10 01/12/2020 0926   CREATININE 1.05 12/17/2012 1229   CALCIUM 8.9 01/12/2020 0926   CALCIUM 8.4 (L) 12/17/2012 1229   PROT 6.8 11/29/2018 0910   ALBUMIN 3.9 11/29/2018  0910   AST 16 11/29/2018 0910   ALT 13 11/29/2018 0910   ALKPHOS 50 11/29/2018 0910   BILITOT 0.6 11/29/2018 0910   GFRNONAA >60 01/12/2020 0926   GFRNONAA >60 12/17/2012 1229   GFRAA >60 01/12/2020 0926   GFRAA >60 12/17/2012 1229    No results found for: SPEP, UPEP  Lab Results  Component Value Date   WBC 13.7 (H) 01/12/2020   NEUTROABS 11.3 (H) 01/12/2020   HGB 13.8 01/12/2020   HCT 44.2 01/12/2020   MCV 90.8 01/12/2020   PLT 76 (L) 01/12/2020      Chemistry      Component Value Date/Time   NA 144 01/12/2020 0926   NA 140 12/17/2012 1229   K 4.1 01/12/2020 0926   K 4.4 12/17/2012 1229   CL 108 01/12/2020 0926   CL 106 12/17/2012 1229   CO2 27 01/12/2020 0926   CO2 26 12/17/2012 1229   BUN 22 01/12/2020 0926   BUN 19 (H) 12/17/2012 1229   CREATININE 1.10 01/12/2020 0926   CREATININE 1.05 12/17/2012 1229      Component Value Date/Time   CALCIUM 8.9 01/12/2020 0926   CALCIUM 8.4 (L) 12/17/2012 1229   ALKPHOS 50 11/29/2018 0910   AST 16 11/29/2018 0910   ALT 13 11/29/2018 0910   BILITOT 0.6 11/29/2018 0910       RADIOGRAPHIC STUDIES: I have personally reviewed the radiological images as listed and agreed with the findings in the report. No results found.   ASSESSMENT & PLAN:   Idiopathic thrombocytopenic purpura  ([place]) #Chronic ITP-platelets stable between 60-70,000.  Asymptomatic.  Platelets today are 76,000 continue surveillance. STABLE;  Would recommend treatment with thrombopoietin stimulating agent if platelets less than 50 or patient is bleeding.  # Hypocalcemia-STABE;  recommend vit D1000 Units/day.   # melanoma [2 years] status post excision-early stage.  Question basal cell carcinoma left cheek awaiting evaluation with Dr. Evorn Gong.  # DISPOSITION:  # follow up in 6 months with MD/labs- cbc/bmp-Dr.B      Cammie Sickle, MD 01/12/2020 10:01 AM

## 2020-01-19 DIAGNOSIS — D485 Neoplasm of uncertain behavior of skin: Secondary | ICD-10-CM | POA: Diagnosis not present

## 2020-01-19 DIAGNOSIS — L570 Actinic keratosis: Secondary | ICD-10-CM | POA: Diagnosis not present

## 2020-01-19 DIAGNOSIS — D2262 Melanocytic nevi of left upper limb, including shoulder: Secondary | ICD-10-CM | POA: Diagnosis not present

## 2020-01-19 DIAGNOSIS — L308 Other specified dermatitis: Secondary | ICD-10-CM | POA: Diagnosis not present

## 2020-01-19 DIAGNOSIS — X32XXXA Exposure to sunlight, initial encounter: Secondary | ICD-10-CM | POA: Diagnosis not present

## 2020-01-19 DIAGNOSIS — M170 Bilateral primary osteoarthritis of knee: Secondary | ICD-10-CM | POA: Diagnosis not present

## 2020-01-19 DIAGNOSIS — C44619 Basal cell carcinoma of skin of left upper limb, including shoulder: Secondary | ICD-10-CM | POA: Diagnosis not present

## 2020-01-19 DIAGNOSIS — Z85820 Personal history of malignant melanoma of skin: Secondary | ICD-10-CM | POA: Diagnosis not present

## 2020-01-19 DIAGNOSIS — D2271 Melanocytic nevi of right lower limb, including hip: Secondary | ICD-10-CM | POA: Diagnosis not present

## 2020-01-19 DIAGNOSIS — Z85828 Personal history of other malignant neoplasm of skin: Secondary | ICD-10-CM | POA: Diagnosis not present

## 2020-01-28 DIAGNOSIS — H903 Sensorineural hearing loss, bilateral: Secondary | ICD-10-CM | POA: Diagnosis not present

## 2020-01-28 DIAGNOSIS — R42 Dizziness and giddiness: Secondary | ICD-10-CM | POA: Diagnosis not present

## 2020-02-25 DIAGNOSIS — I10 Essential (primary) hypertension: Secondary | ICD-10-CM | POA: Diagnosis not present

## 2020-02-25 DIAGNOSIS — M1000 Idiopathic gout, unspecified site: Secondary | ICD-10-CM | POA: Diagnosis not present

## 2020-02-25 DIAGNOSIS — E7849 Other hyperlipidemia: Secondary | ICD-10-CM | POA: Diagnosis not present

## 2020-02-25 DIAGNOSIS — D649 Anemia, unspecified: Secondary | ICD-10-CM | POA: Diagnosis not present

## 2020-03-01 DIAGNOSIS — M5136 Other intervertebral disc degeneration, lumbar region: Secondary | ICD-10-CM | POA: Diagnosis not present

## 2020-03-01 DIAGNOSIS — I10 Essential (primary) hypertension: Secondary | ICD-10-CM | POA: Diagnosis not present

## 2020-03-01 DIAGNOSIS — Z85820 Personal history of malignant melanoma of skin: Secondary | ICD-10-CM | POA: Diagnosis not present

## 2020-03-01 DIAGNOSIS — M5440 Lumbago with sciatica, unspecified side: Secondary | ICD-10-CM | POA: Diagnosis not present

## 2020-03-01 DIAGNOSIS — Z8616 Personal history of COVID-19: Secondary | ICD-10-CM | POA: Diagnosis not present

## 2020-03-01 DIAGNOSIS — D693 Immune thrombocytopenic purpura: Secondary | ICD-10-CM | POA: Diagnosis not present

## 2020-03-01 DIAGNOSIS — D692 Other nonthrombocytopenic purpura: Secondary | ICD-10-CM | POA: Diagnosis not present

## 2020-03-01 DIAGNOSIS — E7849 Other hyperlipidemia: Secondary | ICD-10-CM | POA: Diagnosis not present

## 2020-03-01 DIAGNOSIS — I480 Paroxysmal atrial fibrillation: Secondary | ICD-10-CM | POA: Diagnosis not present

## 2020-03-01 DIAGNOSIS — I493 Ventricular premature depolarization: Secondary | ICD-10-CM | POA: Diagnosis not present

## 2020-03-01 DIAGNOSIS — I700 Atherosclerosis of aorta: Secondary | ICD-10-CM | POA: Diagnosis not present

## 2020-03-01 DIAGNOSIS — I5022 Chronic systolic (congestive) heart failure: Secondary | ICD-10-CM | POA: Diagnosis not present

## 2020-03-01 DIAGNOSIS — M1000 Idiopathic gout, unspecified site: Secondary | ICD-10-CM | POA: Diagnosis not present

## 2020-03-01 DIAGNOSIS — I2510 Atherosclerotic heart disease of native coronary artery without angina pectoris: Secondary | ICD-10-CM | POA: Diagnosis not present

## 2020-03-02 DIAGNOSIS — C44619 Basal cell carcinoma of skin of left upper limb, including shoulder: Secondary | ICD-10-CM | POA: Diagnosis not present

## 2020-03-23 DIAGNOSIS — I10 Essential (primary) hypertension: Secondary | ICD-10-CM | POA: Diagnosis not present

## 2020-03-30 DIAGNOSIS — M1000 Idiopathic gout, unspecified site: Secondary | ICD-10-CM | POA: Diagnosis not present

## 2020-03-30 DIAGNOSIS — I700 Atherosclerosis of aorta: Secondary | ICD-10-CM | POA: Diagnosis not present

## 2020-03-30 DIAGNOSIS — I480 Paroxysmal atrial fibrillation: Secondary | ICD-10-CM | POA: Diagnosis not present

## 2020-03-30 DIAGNOSIS — I10 Essential (primary) hypertension: Secondary | ICD-10-CM | POA: Diagnosis not present

## 2020-03-30 DIAGNOSIS — I493 Ventricular premature depolarization: Secondary | ICD-10-CM | POA: Diagnosis not present

## 2020-03-30 DIAGNOSIS — K219 Gastro-esophageal reflux disease without esophagitis: Secondary | ICD-10-CM | POA: Diagnosis not present

## 2020-03-30 DIAGNOSIS — D693 Immune thrombocytopenic purpura: Secondary | ICD-10-CM | POA: Diagnosis not present

## 2020-03-30 DIAGNOSIS — Z8616 Personal history of COVID-19: Secondary | ICD-10-CM | POA: Diagnosis not present

## 2020-03-30 DIAGNOSIS — E7849 Other hyperlipidemia: Secondary | ICD-10-CM | POA: Diagnosis not present

## 2020-03-30 DIAGNOSIS — I2510 Atherosclerotic heart disease of native coronary artery without angina pectoris: Secondary | ICD-10-CM | POA: Diagnosis not present

## 2020-03-30 DIAGNOSIS — D692 Other nonthrombocytopenic purpura: Secondary | ICD-10-CM | POA: Diagnosis not present

## 2020-03-30 DIAGNOSIS — I5022 Chronic systolic (congestive) heart failure: Secondary | ICD-10-CM | POA: Diagnosis not present

## 2020-07-04 NOTE — Progress Notes (Signed)
07/05/2020 9:27 AM   Levi Figueroa [birth year] [id number]  Referring provider: [name], MD [facility] [place],  [place] [address] Chief Complaint  Patient presents with  . Follow-up    Urologic history:  1.  History of bladder calculus             -Status post cystolitholapaxy 4cm bladder calculus 8/19  2.  BPH             -History 2 prostate laser procedures by Dr. Yves Dill  HPI: Levi Figueroa is a 85 y.o. with a history of bladder calculus and BPH returns for an annual follow up.   -He underwent cystolitholapaxy of a 4 cm bladder calculus and 06/2018  -He stated he had no postoperative problems and had been voiding well.   -CT A/P 11/2018 by oncology and the bladder was clear of calculi. No abnormalities were noted.   -Reports adequate emptying with a good stream. -Denies dysuria. No hematuria -Denies flank, abdominal or pelvic pain   PMH: Past Medical History:  Diagnosis Date  . CAD (coronary artery disease)   . Cancer ([place])    melanoma  . CHF (congestive heart failure) ([place])    2017 with pneumonia  . Colon polyps   . GERD (gastroesophageal reflux disease)   . Gout   . Hyperlipidemia   . Hypertension   . IDA (iron deficiency anemia)   . Kidney stones   . MI (myocardial infarction) ([place]) 2004  . Mild anemia   . Thrombocytopenia [facility])     Surgical History: Past Surgical History:  Procedure Laterality Date  . BLADDER SURGERY    . CARDIAC CATHETERIZATION    . CORONARY ARTERY BYPASS GRAFT    . CYSTOSCOPY WITH LITHOLAPAXY N/A 07/05/2018   Procedure: CYSTOSCOPY WITH LITHOLAPAXY;  Surgeon: [name], MD;  Location: [facility] ORS;  Service: Urology;  Laterality: N/A;  . HOLMIUM LASER APPLICATION N/A 12/05/1476   Procedure: HOLMIUM LASER APPLICATION;  Surgeon: [name], MD;  Location: [facility] ORS;  Service: Urology;  Laterality: N/A;  . INGUINAL HERNIA REPAIR Right   . LASER ABLATION     x 2 prostatic hypertrophy    . TONSILLECTOMY      Home Medications:  Allergies as of 07/05/2020      Reactions   Penicillins Swelling   Has patient had a PCN reaction causing immediate rash, facial/tongue/throat swelling, SOB or lightheadedness with hypotension: Yes Has patient had a PCN reaction causing severe rash involving mucus membranes or skin necrosis: No Has patient had a PCN reaction that required hospitalization No Has patient had a PCN reaction occurring within the last 10 years: No If all of the above answers are "NO", then may proceed with Cephalosporin use.      Medication List       Accurate as of July 05, 2020  9:27 AM. If you have any questions, ask your nurse or doctor.        acetaminophen 500 MG tablet Commonly known as: TYLENOL Take 500 mg by mouth every 6 (six) hours as needed.   aspirin EC 81 MG tablet Take 81 mg by mouth every evening.   atorvastatin 10 MG tablet Commonly known as: LIPITOR Take 5 mg by mouth daily at 2 PM.   azelastine 0.1 % nasal spray Commonly known as: ASTELIN Place 1 spray into both nostrils 2 (two) times daily as needed for allergies. Use in each nostril as directed  Cartia XT 240 MG 24 hr capsule Generic drug: diltiazem Take 240 mg by mouth daily.   Fish Oil 1200 MG Caps Take 1,200 mg by mouth daily.   lisinopril 5 MG tablet Commonly known as: ZESTRIL Take 1 tablet daily by mouth.   loratadine 10 MG tablet Commonly known as: CLARITIN Take 10 mg by mouth daily.   metoprolol tartrate 50 MG tablet Commonly known as: LOPRESSOR Take 1 tablet by mouth in the morning and at bedtime.   multivitamin with minerals tablet Take 1 tablet by mouth daily.   PRESERVISION AREDS 2 PO Take 1 tablet by mouth 2 (two) times daily.   omeprazole 20 MG capsule Commonly known as: PRILOSEC Take 20 mg by mouth daily.       Allergies:  Allergies  Allergen Reactions  . Penicillins Swelling    Has patient had a PCN reaction causing immediate rash,  facial/tongue/throat swelling, SOB or lightheadedness with hypotension: Yes Has patient had a PCN reaction causing severe rash involving mucus membranes or skin necrosis: No Has patient had a PCN reaction that required hospitalization No Has patient had a PCN reaction occurring within the last 10 years: No If all of the above answers are "NO", then may proceed with Cephalosporin use.     Family History: Family History  Problem Relation Age of Onset  . Breast cancer Mother   . Diabetes Father   . Heart disease Father   . Heart disease Brother   . Heart disease Brother   . Prostate cancer Neg Hx   . Bladder Cancer Neg Hx   . Kidney cancer Neg Hx     Social History:  reports that he has never smoked. He has never used smokeless tobacco. He reports that he does not drink alcohol and does not use drugs.   Physical Exam: BP 110/72   Pulse 68   Ht 5\' 11"  (1.803 m)   Wt 176 lb 11.2 oz (80.2 kg)   BMI 24.64 kg/m   Constitutional:  Alert and oriented, No acute distress. HEENT: [place] AT, moist mucus membranes.  Trachea midline, no masses. Cardiovascular: No clubbing, cyanosis, or edema. Respiratory: Normal respiratory effort, no increased work of breathing. Skin: No rashes, bruises or suspicious lesions. Neurologic: Grossly intact, no focal deficits, moving all 4 extremities. Psychiatric: Normal mood and affect.  Laboratory Data:  Lab Results  Component Value Date   CREATININE 1.10 01/12/2020    Assessment & Plan:    1. History of bladder calculus S/p cystolitholapaxy  KUB today, will call with results. If KUB is normal will follow patient as needed.   2. BPH -No bothersome LUTS   [facility] [address] [place], [place] [address] [phone]  I, Selena Batten, am acting as a scribe for Dr. Nicki Reaper C. Desaree Downen,  I have reviewed the above documentation for accuracy and completeness, and I agree with the above.   [name], MD

## 2020-07-05 ENCOUNTER — Other Ambulatory Visit: Payer: Self-pay

## 2020-07-05 ENCOUNTER — Ambulatory Visit
Admission: RE | Admit: 2020-07-05 | Discharge: 2020-07-05 | Disposition: A | Discharge status: Home / Self Care | Payer: PPO | Source: Ambulatory Visit | Attending: Urology | Admitting: Urology

## 2020-07-05 ENCOUNTER — Ambulatory Visit: Payer: PPO | Admitting: Urology

## 2020-07-05 ENCOUNTER — Encounter: Payer: Self-pay | Admitting: Urology

## 2020-07-05 VITALS — BP 110/72 | HR 68 | Ht 71.0 in | Wt 176.7 lb

## 2020-07-05 DIAGNOSIS — N400 Enlarged prostate without lower urinary tract symptoms: Secondary | ICD-10-CM | POA: Diagnosis not present

## 2020-07-05 DIAGNOSIS — Z87442 Personal history of urinary calculi: Secondary | ICD-10-CM | POA: Diagnosis not present

## 2020-07-05 DIAGNOSIS — N210 Calculus in bladder: Secondary | ICD-10-CM | POA: Diagnosis not present

## 2020-07-05 DIAGNOSIS — M160 Bilateral primary osteoarthritis of hip: Secondary | ICD-10-CM | POA: Diagnosis not present

## 2020-07-07 ENCOUNTER — Telehealth: Payer: Self-pay | Admitting: *Deleted

## 2020-07-07 NOTE — Telephone Encounter (Signed)
Notified patient as instructed, patient pleased. Discussed follow-up appointments, patient agrees  

## 2020-07-07 NOTE — Telephone Encounter (Signed)
-----   Message from [name], MD sent at 07/06/2020  5:01 PM EDT ----- KUB reviewed and no evidence of recurrent bladder calculi

## 2020-07-12 ENCOUNTER — Other Ambulatory Visit: Payer: Self-pay

## 2020-07-12 ENCOUNTER — Inpatient Hospital Stay (HOSPITAL_BASED_OUTPATIENT_CLINIC_OR_DEPARTMENT_OTHER): Payer: PPO | Admitting: Internal Medicine

## 2020-07-12 ENCOUNTER — Other Ambulatory Visit: Payer: Self-pay | Admitting: *Deleted

## 2020-07-12 ENCOUNTER — Inpatient Hospital Stay: Payer: PPO | Attending: Internal Medicine

## 2020-07-12 ENCOUNTER — Encounter: Payer: Self-pay | Admitting: Internal Medicine

## 2020-07-12 DIAGNOSIS — D2261 Melanocytic nevi of right upper limb, including shoulder: Secondary | ICD-10-CM | POA: Diagnosis not present

## 2020-07-12 DIAGNOSIS — Z7982 Long term (current) use of aspirin: Secondary | ICD-10-CM | POA: Insufficient documentation

## 2020-07-12 DIAGNOSIS — K219 Gastro-esophageal reflux disease without esophagitis: Secondary | ICD-10-CM | POA: Insufficient documentation

## 2020-07-12 DIAGNOSIS — Z803 Family history of malignant neoplasm of breast: Secondary | ICD-10-CM | POA: Diagnosis not present

## 2020-07-12 DIAGNOSIS — D225 Melanocytic nevi of trunk: Secondary | ICD-10-CM | POA: Diagnosis not present

## 2020-07-12 DIAGNOSIS — Z79899 Other long term (current) drug therapy: Secondary | ICD-10-CM | POA: Insufficient documentation

## 2020-07-12 DIAGNOSIS — D2262 Melanocytic nevi of left upper limb, including shoulder: Secondary | ICD-10-CM | POA: Diagnosis not present

## 2020-07-12 DIAGNOSIS — Z85820 Personal history of malignant melanoma of skin: Secondary | ICD-10-CM | POA: Diagnosis not present

## 2020-07-12 DIAGNOSIS — I509 Heart failure, unspecified: Secondary | ICD-10-CM | POA: Diagnosis not present

## 2020-07-12 DIAGNOSIS — Z833 Family history of diabetes mellitus: Secondary | ICD-10-CM | POA: Diagnosis not present

## 2020-07-12 DIAGNOSIS — I110 Hypertensive heart disease with heart failure: Secondary | ICD-10-CM | POA: Insufficient documentation

## 2020-07-12 DIAGNOSIS — D693 Immune thrombocytopenic purpura: Secondary | ICD-10-CM | POA: Diagnosis not present

## 2020-07-12 DIAGNOSIS — X32XXXA Exposure to sunlight, initial encounter: Secondary | ICD-10-CM | POA: Diagnosis not present

## 2020-07-12 DIAGNOSIS — L570 Actinic keratosis: Secondary | ICD-10-CM | POA: Diagnosis not present

## 2020-07-12 DIAGNOSIS — I252 Old myocardial infarction: Secondary | ICD-10-CM | POA: Insufficient documentation

## 2020-07-12 DIAGNOSIS — I2510 Atherosclerotic heart disease of native coronary artery without angina pectoris: Secondary | ICD-10-CM | POA: Insufficient documentation

## 2020-07-12 DIAGNOSIS — E785 Hyperlipidemia, unspecified: Secondary | ICD-10-CM | POA: Insufficient documentation

## 2020-07-12 DIAGNOSIS — D472 Monoclonal gammopathy: Secondary | ICD-10-CM

## 2020-07-12 DIAGNOSIS — Z8249 Family history of ischemic heart disease and other diseases of the circulatory system: Secondary | ICD-10-CM | POA: Insufficient documentation

## 2020-07-12 DIAGNOSIS — D2271 Melanocytic nevi of right lower limb, including hip: Secondary | ICD-10-CM | POA: Diagnosis not present

## 2020-07-12 DIAGNOSIS — Z85828 Personal history of other malignant neoplasm of skin: Secondary | ICD-10-CM | POA: Diagnosis not present

## 2020-07-12 LAB — BASIC METABOLIC PANEL
Anion gap: 5 (ref 5–15)
BUN: 25 mg/dL — ABNORMAL HIGH (ref 8–23)
CO2: 24 mmol/L (ref 22–32)
Calcium: 8.5 mg/dL — ABNORMAL LOW (ref 8.9–10.3)
Chloride: 113 mmol/L — ABNORMAL HIGH (ref 98–111)
Creatinine, Ser: 0.95 mg/dL (ref 0.61–1.24)
GFR calc Af Amer: >60 mL/min (ref >60)
GFR calc non Af Amer: >60 mL/min (ref >60)
Glucose, Bld: 94 mg/dL (ref 70–99)
Potassium: 4.4 mmol/L (ref 3.5–5.1)
Sodium: 142 mmol/L (ref 135–145)

## 2020-07-12 LAB — CBC WITH DIFFERENTIAL/PLATELET
Abs Immature Granulocytes: 0.09 10*3/uL — ABNORMAL HIGH (ref 0.00–0.07)
Basophils Absolute: 0.1 10*3/uL (ref 0.0–0.1)
Basophils Relative: 0 %
Eosinophils Absolute: 0.4 10*3/uL (ref 0.0–0.5)
Eosinophils Relative: 3 %
HCT: 38.4 % — ABNORMAL LOW (ref 39.0–52.0)
Hemoglobin: 13 g/dL (ref 13.0–17.0)
Immature Granulocytes: 1 %
Lymphocytes Relative: 9 %
Lymphs Abs: 1.1 10*3/uL (ref 0.7–4.0)
MCH: 29.3 pg (ref 26.0–34.0)
MCHC: 33.9 g/dL (ref 30.0–36.0)
MCV: 86.5 fL (ref 80.0–100.0)
Monocytes Absolute: 1 10*3/uL (ref 0.1–1.0)
Monocytes Relative: 7 %
Neutro Abs: 10.8 10*3/uL — ABNORMAL HIGH (ref 1.7–7.7)
Neutrophils Relative %: 80 %
Platelets: 64 10*3/uL — ABNORMAL LOW (ref 150–400)
RBC: 4.44 MIL/uL (ref 4.22–5.81)
RDW: 14.6 % (ref 11.5–15.5)
WBC: 13.4 10*3/uL — ABNORMAL HIGH (ref 4.0–10.5)
nRBC: 0 % (ref 0.0–0.2)

## 2020-07-12 NOTE — Assessment & Plan Note (Addendum)
#  Chronic ITP-platelets stable between 60-70,000.  Asymptomatic.  Platelets today are 64 continue surveillance.STABLE;  Would recommend treatment with thrombopoietin stimulating agent if platelets less than 50 or patient is bleeding.  # Hypocalcemia-STABE;continue D1000 Units/day; add calcium.    # DISPOSITION:  # follow up in 6 months with MD/labs- cbc/bmp-Dr.B

## 2020-07-12 NOTE — Progress Notes (Signed)
[employer] OFFICE PROGRESS NOTE  Patient Care Team: [name], MD as PCP - General (Internal Medicine)   SUMMARY OF ONCOLOGIC HISTORY:  2012- CHRONIC ITP-[ BMBx; 2012- hypercellular 60%; megakaryocytes/no dyspoiesis; FISH- Neg; Dr.Pandit]; [place]- Abdo-Neg for spleen/liver; HIV/hepatitis-NEG; OCT 2017- Rituxan weekly x4  # July 2017- Melanoma s/p excision [? Stage; Dr.Dasher]; status post lithotripsy [Dr. Bernardo Heater; 2019]; 2019-CT scan incidental liver lesion; likely hemangioma [Jan 2020 CT scan]  INTERVAL HISTORY:  85 year old male patient with a history of ITP currently on surveillance is here for follow-up.  Patient denies any easy bruising. Denies any blood in stools or black or stools. Not on any recent steroids.   Review of Systems  Constitutional: Negative.  Negative for chills, diaphoresis, fever, malaise/fatigue and weight loss.  HENT: Negative.  Negative for nosebleeds and sore throat.   Eyes: Negative.  Negative for double vision.  Respiratory: Negative.  Negative for cough, hemoptysis, sputum production, shortness of breath and wheezing.   Cardiovascular: Negative.  Negative for chest pain, palpitations, orthopnea and leg swelling.  Gastrointestinal: Negative.  Negative for abdominal pain, blood in stool, constipation, diarrhea, heartburn, melena, nausea and vomiting.  Genitourinary: Negative.  Negative for dysuria, frequency and urgency.  Musculoskeletal: Negative.  Negative for back pain and joint pain.  Skin: Negative.  Negative for itching and rash.  Neurological: Negative.  Negative for dizziness, tingling, focal weakness, weakness and headaches.  Endo/Heme/Allergies: Bruises/bleeds easily.  Psychiatric/Behavioral: Negative.  Negative for depression. The patient is not nervous/anxious and does not have insomnia.      PAST MEDICAL HISTORY :  Past Medical History:  Diagnosis Date   CAD (coronary artery disease)    Cancer ([place])    melanoma    CHF (congestive heart failure) ([place])    2017 with pneumonia   Colon polyps    GERD (gastroesophageal reflux disease)    Gout    Hyperlipidemia    Hypertension    IDA (iron deficiency anemia)    Kidney stones    MI (myocardial infarction) ([place]) 2004   Mild anemia    Thrombocytopenia ([facility])     PAST SURGICAL HISTORY :   Past Surgical History:  Procedure Laterality Date   BLADDER SURGERY     CARDIAC CATHETERIZATION     CORONARY ARTERY BYPASS GRAFT     CYSTOSCOPY WITH LITHOLAPAXY N/A 07/05/2018   Procedure: CYSTOSCOPY WITH LITHOLAPAXY;  Surgeon: [name], MD;  Location: [facility] ORS;  Service: Urology;  Laterality: N/A;   HOLMIUM LASER APPLICATION N/A 08/03/262   Procedure: HOLMIUM LASER APPLICATION;  Surgeon: [name], MD;  Location: [facility] OR[facility];  Service: Urology;  Laterality: N/A;   INGUINAL HERNIA REPAIR Right    LASER ABLATION     x 2 prostatic hypertrophy   TONSILLECTOMY      FAMILY HISTORY :   Family History  Problem Relation Age of Onset   Breast cancer Mother    Diabetes Father    Heart disease Father    Heart disease Brother    Heart disease Brother    Prostate cancer Neg Hx    Bladder Cancer Neg Hx    Kidney cancer Neg Hx     SOCIAL HISTORY:   Social History   Tobacco Use   Smoking status: Never Smoker   Smokeless tobacco: Never Used  Vaping Use   Vaping Use: Never used  Substance Use Topics   Alcohol use: No    Alcohol/week: 0.0 standard drinks   Drug  use: No    ALLERGIES:  is allergic to penicillins.  MEDICATIONS:  Current Outpatient Medications  Medication Sig Dispense Refill   acetaminophen (TYLENOL) 500 MG tablet Take 500 mg by mouth every 6 (six) hours as needed.     aspirin EC 81 MG tablet Take 81 mg by mouth every evening.     atorvastatin (LIPITOR) 10 MG tablet Take 5 mg by mouth daily at 2 PM.      azelastine (ASTELIN) 0.1 % nasal spray Place 1 spray into both nostrils 2 (two) times daily as  needed for allergies. Use in each nostril as directed     CARTIA XT 240 MG 24 hr capsule Take 240 mg by mouth daily.  3   lisinopril (PRINIVIL,ZESTRIL) 5 MG tablet Take 1 tablet daily by mouth.     loratadine (CLARITIN) 10 MG tablet Take 10 mg by mouth daily.      metoprolol tartrate (LOPRESSOR) 50 MG tablet Take 1 tablet by mouth in the morning and at bedtime.     Multiple Vitamins-Minerals (MULTIVITAMIN WITH MINERALS) tablet Take 1 tablet by mouth daily.     Multiple Vitamins-Minerals (PRESERVISION AREDS 2 PO) Take 1 tablet by mouth 2 (two) times daily.     Omega-3 Fatty Acids (FISH OIL) 1200 MG CAPS Take 1,200 mg by mouth daily.     omeprazole (PRILOSEC) 20 MG capsule Take 20 mg by mouth daily.     No current facility-administered medications for this visit.    PHYSICAL EXAMINATION:   BP (!) 133/47 (BP Location: Left Arm, Patient Position: Sitting, Cuff Size: Large)    Pulse (!) 56    Temp 98.5 F (36.9 C) (Tympanic)    Resp 16    Ht 5\' 11"  (1.803 m)    Wt 178 lb 3.2 oz (80.8 kg)    SpO2 98%    BMI 24.85 kg/m   Filed Weights   07/12/20 1022  Weight: 178 lb 3.2 oz (80.8 kg)    Physical Exam Constitutional:      Comments: He is alone.  HENT:     Head: Normocephalic and atraumatic.     Mouth/Throat:     Pharynx: No oropharyngeal exudate.  Eyes:     Pupils: Pupils are equal, round, and reactive to light.  Cardiovascular:     Rate and Rhythm: Normal rate and regular rhythm.  Pulmonary:     Effort: No respiratory distress.     Breath sounds: No wheezing.  Abdominal:     General: Bowel sounds are normal. There is no distension.     Palpations: Abdomen is soft. There is no mass.     Tenderness: There is no abdominal tenderness. There is no guarding or rebound.  Musculoskeletal:        General: No tenderness. Normal range of motion.     Cervical back: Normal range of motion and neck supple.  Skin:    General: Skin is warm.  Neurological:     Mental Status: He is  alert and oriented to person, place, and time.  Psychiatric:        Mood and Affect: Affect normal.     LABORATORY DATA:  I have reviewed the data as listed    Component Value Date/Time   NA 142 07/12/2020 0952   NA 140 12/17/2012 1229   K 4.4 07/12/2020 0952   K 4.4 12/17/2012 1229   CL 113 (H) 07/12/2020 0952   CL 106 12/17/2012 1229   CO2 24 07/12/2020  0952   CO2 26 12/17/2012 1229   GLUCOSE 94 07/12/2020 0952   GLUCOSE 77 12/17/2012 1229   BUN 25 (H) 07/12/2020 0952   BUN 19 (H) 12/17/2012 1229   CREATININE 0.95 07/12/2020 0952   CREATININE 1.05 12/17/2012 1229   CALCIUM 8.5 (L) 07/12/2020 0952   CALCIUM 8.4 (L) 12/17/2012 1229   PROT 6.8 11/29/2018 0910   ALBUMIN 3.9 11/29/2018 0910   AST 16 11/29/2018 0910   ALT 13 11/29/2018 0910   ALKPHOS 50 11/29/2018 0910   BILITOT 0.6 11/29/2018 0910   GFRNONAA >60 07/12/2020 0952   GFRNONAA >60 12/17/2012 1229   GFRAA >60 07/12/2020 0952   GFRAA >60 12/17/2012 1229    No results found for: SPEP, UPEP  Lab Results  Component Value Date   WBC 13.4 (H) 07/12/2020   NEUTROABS 10.8 (H) 07/12/2020   HGB 13.0 07/12/2020   HCT 38.4 (L) 07/12/2020   MCV 86.5 07/12/2020   PLT 64 (L) 07/12/2020      Chemistry      Component Value Date/Time   NA 142 07/12/2020 0952   NA 140 12/17/2012 1229   K 4.4 07/12/2020 0952   K 4.4 12/17/2012 1229   CL 113 (H) 07/12/2020 0952   CL 106 12/17/2012 1229   CO2 24 07/12/2020 0952   CO2 26 12/17/2012 1229   BUN 25 (H) 07/12/2020 0952   BUN 19 (H) 12/17/2012 1229   CREATININE 0.95 07/12/2020 0952   CREATININE 1.05 12/17/2012 1229      Component Value Date/Time   CALCIUM 8.5 (L) 07/12/2020 0952   CALCIUM 8.4 (L) 12/17/2012 1229   ALKPHOS 50 11/29/2018 0910   AST 16 11/29/2018 0910   ALT 13 11/29/2018 0910   BILITOT 0.6 11/29/2018 0910       RADIOGRAPHIC STUDIES: I have personally reviewed the radiological images as listed and agreed with the findings in the report. No  results found.   ASSESSMENT & PLAN:   Idiopathic thrombocytopenic purpura ([place]) #Chronic ITP-platelets stable between 60-70,000.  Asymptomatic.  Platelets today are 64 continue surveillance.STABLE;  Would recommend treatment with thrombopoietin stimulating agent if platelets less than 50 or patient is bleeding.  # Hypocalcemia-STABE;continue D1000 Units/day; add calcium.    # DISPOSITION:  # follow up in 6 months with MD/labs- cbc/bmp-Dr.B      Cammie Sickle, MD 07/12/2020 10:38 AM

## 2020-08-04 DIAGNOSIS — I480 Paroxysmal atrial fibrillation: Secondary | ICD-10-CM | POA: Diagnosis not present

## 2020-08-04 DIAGNOSIS — I5022 Chronic systolic (congestive) heart failure: Secondary | ICD-10-CM | POA: Diagnosis not present

## 2020-08-04 DIAGNOSIS — I2510 Atherosclerotic heart disease of native coronary artery without angina pectoris: Secondary | ICD-10-CM | POA: Diagnosis not present

## 2020-08-04 DIAGNOSIS — I493 Ventricular premature depolarization: Secondary | ICD-10-CM | POA: Diagnosis not present

## 2020-08-04 DIAGNOSIS — E7849 Other hyperlipidemia: Secondary | ICD-10-CM | POA: Diagnosis not present

## 2020-08-04 DIAGNOSIS — I10 Essential (primary) hypertension: Secondary | ICD-10-CM | POA: Diagnosis not present

## 2020-08-04 DIAGNOSIS — I700 Atherosclerosis of aorta: Secondary | ICD-10-CM | POA: Diagnosis not present

## 2020-08-16 DIAGNOSIS — M170 Bilateral primary osteoarthritis of knee: Secondary | ICD-10-CM | POA: Diagnosis not present

## 2020-08-19 DIAGNOSIS — I480 Paroxysmal atrial fibrillation: Secondary | ICD-10-CM | POA: Diagnosis not present

## 2020-09-01 DIAGNOSIS — I700 Atherosclerosis of aorta: Secondary | ICD-10-CM | POA: Diagnosis not present

## 2020-09-01 DIAGNOSIS — D693 Immune thrombocytopenic purpura: Secondary | ICD-10-CM | POA: Diagnosis not present

## 2020-09-01 DIAGNOSIS — I2510 Atherosclerotic heart disease of native coronary artery without angina pectoris: Secondary | ICD-10-CM | POA: Diagnosis not present

## 2020-09-01 DIAGNOSIS — I480 Paroxysmal atrial fibrillation: Secondary | ICD-10-CM | POA: Diagnosis not present

## 2020-09-01 DIAGNOSIS — I10 Essential (primary) hypertension: Secondary | ICD-10-CM | POA: Diagnosis not present

## 2020-09-29 DIAGNOSIS — D649 Anemia, unspecified: Secondary | ICD-10-CM | POA: Diagnosis not present

## 2020-09-29 DIAGNOSIS — E7849 Other hyperlipidemia: Secondary | ICD-10-CM | POA: Diagnosis not present

## 2020-09-29 DIAGNOSIS — M1000 Idiopathic gout, unspecified site: Secondary | ICD-10-CM | POA: Diagnosis not present

## 2020-09-29 DIAGNOSIS — I10 Essential (primary) hypertension: Secondary | ICD-10-CM | POA: Diagnosis not present

## 2020-10-06 DIAGNOSIS — I10 Essential (primary) hypertension: Secondary | ICD-10-CM | POA: Diagnosis not present

## 2020-10-06 DIAGNOSIS — I700 Atherosclerosis of aorta: Secondary | ICD-10-CM | POA: Diagnosis not present

## 2020-10-06 DIAGNOSIS — I493 Ventricular premature depolarization: Secondary | ICD-10-CM | POA: Diagnosis not present

## 2020-10-06 DIAGNOSIS — K219 Gastro-esophageal reflux disease without esophagitis: Secondary | ICD-10-CM | POA: Diagnosis not present

## 2020-10-06 DIAGNOSIS — D693 Immune thrombocytopenic purpura: Secondary | ICD-10-CM | POA: Diagnosis not present

## 2020-10-06 DIAGNOSIS — D692 Other nonthrombocytopenic purpura: Secondary | ICD-10-CM | POA: Diagnosis not present

## 2020-10-06 DIAGNOSIS — M1000 Idiopathic gout, unspecified site: Secondary | ICD-10-CM | POA: Diagnosis not present

## 2020-10-06 DIAGNOSIS — I5022 Chronic systolic (congestive) heart failure: Secondary | ICD-10-CM | POA: Diagnosis not present

## 2020-10-06 DIAGNOSIS — Z0000 Encounter for general adult medical examination without abnormal findings: Secondary | ICD-10-CM | POA: Diagnosis not present

## 2020-10-06 DIAGNOSIS — E7849 Other hyperlipidemia: Secondary | ICD-10-CM | POA: Diagnosis not present

## 2020-10-06 DIAGNOSIS — I2510 Atherosclerotic heart disease of native coronary artery without angina pectoris: Secondary | ICD-10-CM | POA: Diagnosis not present

## 2020-10-06 DIAGNOSIS — I480 Paroxysmal atrial fibrillation: Secondary | ICD-10-CM | POA: Diagnosis not present

## 2021-01-11 DIAGNOSIS — H353132 Nonexudative age-related macular degeneration, bilateral, intermediate dry stage: Secondary | ICD-10-CM | POA: Diagnosis not present

## 2021-01-12 ENCOUNTER — Encounter: Payer: Self-pay | Admitting: Internal Medicine

## 2021-01-12 ENCOUNTER — Inpatient Hospital Stay: Payer: PPO | Attending: Internal Medicine

## 2021-01-12 ENCOUNTER — Inpatient Hospital Stay (HOSPITAL_BASED_OUTPATIENT_CLINIC_OR_DEPARTMENT_OTHER): Payer: PPO | Admitting: Internal Medicine

## 2021-01-12 DIAGNOSIS — D693 Immune thrombocytopenic purpura: Secondary | ICD-10-CM | POA: Insufficient documentation

## 2021-01-12 DIAGNOSIS — D472 Monoclonal gammopathy: Secondary | ICD-10-CM

## 2021-01-12 LAB — CBC WITH DIFFERENTIAL/PLATELET
Abs Immature Granulocytes: 0.09 10*3/uL — ABNORMAL HIGH (ref 0.00–0.07)
Basophils Absolute: 0.1 10*3/uL (ref 0.0–0.1)
Basophils Relative: 1 %
Eosinophils Absolute: 0.4 10*3/uL (ref 0.0–0.5)
Eosinophils Relative: 3 %
HCT: 38.1 % — ABNORMAL LOW (ref 39.0–52.0)
Hemoglobin: 12.7 g/dL — ABNORMAL LOW (ref 13.0–17.0)
Immature Granulocytes: 1 %
Lymphocytes Relative: 8 %
Lymphs Abs: 1 10*3/uL (ref 0.7–4.0)
MCH: 29.7 pg (ref 26.0–34.0)
MCHC: 33.3 g/dL (ref 30.0–36.0)
MCV: 89 fL (ref 80.0–100.0)
Monocytes Absolute: 1 10*3/uL (ref 0.1–1.0)
Monocytes Relative: 9 %
Neutro Abs: 9.5 10*3/uL — ABNORMAL HIGH (ref 1.7–7.7)
Neutrophils Relative %: 78 %
Platelets: 65 10*3/uL — ABNORMAL LOW (ref 150–400)
RBC: 4.28 MIL/uL (ref 4.22–5.81)
RDW: 14.6 % (ref 11.5–15.5)
WBC: 12 10*3/uL — ABNORMAL HIGH (ref 4.0–10.5)
nRBC: 0 % (ref 0.0–0.2)

## 2021-01-12 LAB — BASIC METABOLIC PANEL
Anion gap: 9 (ref 5–15)
BUN: 20 mg/dL (ref 8–23)
CO2: 25 mmol/L (ref 22–32)
Calcium: 8.6 mg/dL — ABNORMAL LOW (ref 8.9–10.3)
Chloride: 109 mmol/L (ref 98–111)
Creatinine, Ser: 1.02 mg/dL (ref 0.61–1.24)
GFR, Estimated: >60 mL/min (ref >60)
Glucose, Bld: 103 mg/dL — ABNORMAL HIGH (ref 70–99)
Potassium: 4.2 mmol/L (ref 3.5–5.1)
Sodium: 143 mmol/L (ref 135–145)

## 2021-01-12 NOTE — Assessment & Plan Note (Addendum)
#  Chronic ITP-platelets stable between 60-70,000.  Asymptomatic.  Platelets today are 65 continue surveillance. STABLE.  Would recommend treatment with thrombopoietin stimulating agent if platelets less than 50 or patient is bleeding.   # DISPOSITION:  # follow up in 6 months with MD/labs- cbc/bmp-Dr.B

## 2021-01-12 NOTE — Progress Notes (Signed)
[facility] OFFICE PROGRESS NOTE  Patient Care Team: [name], MD as PCP - General (Internal Medicine)   SUMMARY OF ONCOLOGIC HISTORY:  2012- CHRONIC ITP-[ BMBx; 2012- hypercellular 60%; megakaryocytes/no dyspoiesis; FISH- Neg; Dr.Pandit]; [place]- Abdo-Neg for spleen/liver; HIV/hepatitis-NEG; OCT 2017- Rituxan weekly x4  # July 2017- Melanoma s/p excision [? Stage; Dr.Dasher]; status post lithotripsy [Dr. Bernardo Heater; 2019]; 2019-CT scan incidental liver lesion; likely hemangioma [Jan 2020 CT scan]; 2021- COVID  INTERVAL HISTORY:  86 year old male patient with a history of ITP currently on surveillance is here for follow-up.  Denies any unusual bleeding.  Denies any blood in stools or black-colored stools.  Review of Systems  Constitutional: Negative.  Negative for chills, diaphoresis, fever, malaise/fatigue and weight loss.  HENT: Negative.  Negative for nosebleeds and sore throat.   Eyes: Negative.  Negative for double vision.  Respiratory: Negative.  Negative for cough, hemoptysis, sputum production, shortness of breath and wheezing.   Cardiovascular: Negative.  Negative for chest pain, palpitations, orthopnea and leg swelling.  Gastrointestinal: Negative.  Negative for abdominal pain, blood in stool, constipation, diarrhea, heartburn, melena, nausea and vomiting.  Genitourinary: Negative.  Negative for dysuria, frequency and urgency.  Musculoskeletal: Negative.  Negative for back pain and joint pain.  Skin: Negative.  Negative for itching and rash.  Neurological: Negative.  Negative for dizziness, tingling, focal weakness, weakness and headaches.  Endo/Heme/Allergies: Bruises/bleeds easily.  Psychiatric/Behavioral: Negative.  Negative for depression. The patient is not nervous/anxious and does not have insomnia.      PAST MEDICAL HISTORY :  Past Medical History:  Diagnosis Date  . CAD (coronary artery disease)   . Cancer ([place])    melanoma  . CHF (congestive  heart failure) ([place])    2017 with pneumonia  . Colon polyps   . GERD (gastroesophageal reflux disease)   . Gout   . Hyperlipidemia   . Hypertension   . IDA (iron deficiency anemia)   . Kidney stones   . MI (myocardial infarction) ([place]) 2004  . Mild anemia   . Thrombocytopenia ([place])     PAST SURGICAL HISTORY :   Past Surgical History:  Procedure Laterality Date  . BLADDER SURGERY    . CARDIAC CATHETERIZATION    . CORONARY ARTERY BYPASS GRAFT    . CYSTOSCOPY WITH LITHOLAPAXY N/A 07/05/2018   Procedure: CYSTOSCOPY WITH LITHOLAPAXY;  Surgeon: [name], MD;  Location: [facility] OR[facility];  Service: Urology;  Laterality: N/A;  . HOLMIUM LASER APPLICATION N/A 0/12/5425   Procedure: HOLMIUM LASER APPLICATION;  Surgeon: [name], MD;  Location: [facility] OR[facility];  Service: Urology;  Laterality: N/A;  . INGUINAL HERNIA REPAIR Right   . LASER ABLATION     x 2 prostatic hypertrophy  . TONSILLECTOMY      FAMILY HISTORY :   Family History  Problem Relation Age of Onset  . Breast cancer Mother   . Diabetes Father   . Heart disease Father   . Heart disease Brother   . Heart disease Brother   . Prostate cancer Neg Hx   . Bladder Cancer Neg Hx   . Kidney cancer Neg Hx     SOCIAL HISTORY:   Social History   Tobacco Use  . Smoking status: Never Smoker  . Smokeless tobacco: Never Used  Vaping Use  . Vaping Use: Never used  Substance Use Topics  . Alcohol use: No    Alcohol/week: 0.0 standard drinks  . Drug use: No  ALLERGIES:  is allergic to penicillins.  MEDICATIONS:  Current Outpatient Medications  Medication Sig Dispense Refill  . acetaminophen (TYLENOL) 500 MG tablet Take 500 mg by mouth every 6 (six) hours as needed.    Marland Kitchen aspirin EC 81 MG tablet Take 81 mg by mouth every evening.    Marland Kitchen atorvastatin (LIPITOR) 10 MG tablet Take 5 mg by mouth daily at 2 PM.     . azelastine (ASTELIN) 0.1 % nasal spray Place 1 spray into both nostrils 2 (two) times daily as needed for  allergies. Use in each nostril as directed    . CARTIA XT 240 MG 24 hr capsule Take 240 mg by mouth daily.  3  . lisinopril (PRINIVIL,ZESTRIL) 5 MG tablet Take 1 tablet daily by mouth.    . loratadine (CLARITIN) 10 MG tablet Take 10 mg by mouth daily.    . magnesium oxide (MAG-OX) 400 MG tablet magnesium oxide 400 mg (241.3 mg magnesium) tablet  TAKE 1 TABLET BY MOUTH ONCE DAILY    . metoprolol tartrate (LOPRESSOR) 50 MG tablet Take 1 tablet by mouth in the morning and at bedtime.    . Multiple Vitamins-Minerals (MULTIVITAMIN WITH MINERALS) tablet Take 1 tablet by mouth daily.    . Multiple Vitamins-Minerals (PRESERVISION AREDS 2 PO) Take 1 tablet by mouth 2 (two) times daily.    . Omega-3 Fatty Acids (FISH OIL) 1200 MG CAPS Take 1,200 mg by mouth daily.    Marland Kitchen omeprazole (PRILOSEC) 20 MG capsule Take 20 mg by mouth daily.    Marland Kitchen triamcinolone (KENALOG) 0.1 % triamcinolone acetonide 0.1 % topical cream  APPLY CREAM EXTERNALLY TWICE DAILY TO AFFECTED AREA AS NEEDED     No current facility-administered medications for this visit.    PHYSICAL EXAMINATION:   BP (!) 154/57 (BP Location: Right Arm, Patient Position: Sitting)   Pulse (!) 59   Temp 98.8 F (37.1 C) (Tympanic)   Wt 182 lb 2 oz (82.6 kg)   SpO2 100%   BMI 25.40 kg/m   Filed Weights   01/12/21 0955  Weight: 182 lb 2 oz (82.6 kg)    Physical Exam Constitutional:      Comments: He is alone.  HENT:     Head: Normocephalic and atraumatic.     Mouth/Throat:     Pharynx: No oropharyngeal exudate.  Eyes:     Pupils: Pupils are equal, round, and reactive to light.  Cardiovascular:     Rate and Rhythm: Normal rate and regular rhythm.  Pulmonary:     Effort: No respiratory distress.     Breath sounds: No wheezing.  Abdominal:     General: Bowel sounds are normal. There is no distension.     Palpations: Abdomen is soft. There is no mass.     Tenderness: There is no abdominal tenderness. There is no guarding or rebound.   Musculoskeletal:        General: No tenderness. Normal range of motion.     Cervical back: Normal range of motion and neck supple.  Skin:    General: Skin is warm.  Neurological:     Mental Status: He is alert and oriented to person, place, and time.  Psychiatric:        Mood and Affect: Affect normal.     LABORATORY DATA:  I have reviewed the data as listed    Component Value Date/Time   NA 143 01/12/2021 0931   NA 140 12/17/2012 1229   K 4.2 01/12/2021 0931  K 4.4 12/17/2012 1229   CL 109 01/12/2021 0931   CL 106 12/17/2012 1229   CO2 25 01/12/2021 0931   CO2 26 12/17/2012 1229   GLUCOSE 103 (H) 01/12/2021 0931   GLUCOSE 77 12/17/2012 1229   BUN 20 01/12/2021 0931   BUN 19 (H) 12/17/2012 1229   CREATININE 1.02 01/12/2021 0931   CREATININE 1.05 12/17/2012 1229   CALCIUM 8.6 (L) 01/12/2021 0931   CALCIUM 8.4 (L) 12/17/2012 1229   PROT 6.8 11/29/2018 0910   ALBUMIN 3.9 11/29/2018 0910   AST 16 11/29/2018 0910   ALT 13 11/29/2018 0910   ALKPHOS 50 11/29/2018 0910   BILITOT 0.6 11/29/2018 0910   GFRNONAA >60 01/12/2021 0931   GFRNONAA >60 12/17/2012 1229   GFRAA >60 07/12/2020 0952   GFRAA >60 12/17/2012 1229    No results found for: SPEP, UPEP  Lab Results  Component Value Date   WBC 12.0 (H) 01/12/2021   NEUTROABS 9.5 (H) 01/12/2021   HGB 12.7 (L) 01/12/2021   HCT 38.1 (L) 01/12/2021   MCV 89.0 01/12/2021   PLT 65 (L) 01/12/2021      Chemistry      Component Value Date/Time   NA 143 01/12/2021 0931   NA 140 12/17/2012 1229   K 4.2 01/12/2021 0931   K 4.4 12/17/2012 1229   CL 109 01/12/2021 0931   CL 106 12/17/2012 1229   CO2 25 01/12/2021 0931   CO2 26 12/17/2012 1229   BUN 20 01/12/2021 0931   BUN 19 (H) 12/17/2012 1229   CREATININE 1.02 01/12/2021 0931   CREATININE 1.05 12/17/2012 1229      Component Value Date/Time   CALCIUM 8.6 (L) 01/12/2021 0931   CALCIUM 8.4 (L) 12/17/2012 1229   ALKPHOS 50 11/29/2018 0910   AST 16 11/29/2018 0910    ALT 13 11/29/2018 0910   BILITOT 0.6 11/29/2018 0910       RADIOGRAPHIC STUDIES: I have personally reviewed the radiological images as listed and agreed with the findings in the report. No results found.   ASSESSMENT & PLAN:   Idiopathic thrombocytopenic purpura ([place]) #Chronic ITP-platelets stable between 60-70,000.  Asymptomatic.  Platelets today are 65 continue surveillance. STABLE.  Would recommend treatment with thrombopoietin stimulating agent if platelets less than 50 or patient is bleeding.   # DISPOSITION:  # follow up in 6 months with MD/labs- cbc/bmp-Dr.B      Cammie Sickle, MD 01/12/2021 10:48 AM

## 2021-02-08 DIAGNOSIS — H6123 Impacted cerumen, bilateral: Secondary | ICD-10-CM | POA: Diagnosis not present

## 2021-02-08 DIAGNOSIS — H903 Sensorineural hearing loss, bilateral: Secondary | ICD-10-CM | POA: Diagnosis not present

## 2021-02-17 DIAGNOSIS — H903 Sensorineural hearing loss, bilateral: Secondary | ICD-10-CM | POA: Diagnosis not present

## 2021-03-16 DIAGNOSIS — I480 Paroxysmal atrial fibrillation: Secondary | ICD-10-CM | POA: Diagnosis not present

## 2021-03-16 DIAGNOSIS — I700 Atherosclerosis of aorta: Secondary | ICD-10-CM | POA: Diagnosis not present

## 2021-03-16 DIAGNOSIS — I2510 Atherosclerotic heart disease of native coronary artery without angina pectoris: Secondary | ICD-10-CM | POA: Diagnosis not present

## 2021-03-16 DIAGNOSIS — I493 Ventricular premature depolarization: Secondary | ICD-10-CM | POA: Diagnosis not present

## 2021-03-16 DIAGNOSIS — I10 Essential (primary) hypertension: Secondary | ICD-10-CM | POA: Diagnosis not present

## 2021-03-16 DIAGNOSIS — I5022 Chronic systolic (congestive) heart failure: Secondary | ICD-10-CM | POA: Diagnosis not present

## 2021-03-24 DIAGNOSIS — X32XXXA Exposure to sunlight, initial encounter: Secondary | ICD-10-CM | POA: Diagnosis not present

## 2021-03-24 DIAGNOSIS — D2271 Melanocytic nevi of right lower limb, including hip: Secondary | ICD-10-CM | POA: Diagnosis not present

## 2021-03-24 DIAGNOSIS — L570 Actinic keratosis: Secondary | ICD-10-CM | POA: Diagnosis not present

## 2021-03-24 DIAGNOSIS — D225 Melanocytic nevi of trunk: Secondary | ICD-10-CM | POA: Diagnosis not present

## 2021-03-24 DIAGNOSIS — D2261 Melanocytic nevi of right upper limb, including shoulder: Secondary | ICD-10-CM | POA: Diagnosis not present

## 2021-03-24 DIAGNOSIS — Z85820 Personal history of malignant melanoma of skin: Secondary | ICD-10-CM | POA: Diagnosis not present

## 2021-03-24 DIAGNOSIS — Z85828 Personal history of other malignant neoplasm of skin: Secondary | ICD-10-CM | POA: Diagnosis not present

## 2021-03-29 DIAGNOSIS — I480 Paroxysmal atrial fibrillation: Secondary | ICD-10-CM | POA: Diagnosis not present

## 2021-03-29 DIAGNOSIS — M1000 Idiopathic gout, unspecified site: Secondary | ICD-10-CM | POA: Diagnosis not present

## 2021-03-29 DIAGNOSIS — D693 Immune thrombocytopenic purpura: Secondary | ICD-10-CM | POA: Diagnosis not present

## 2021-03-29 DIAGNOSIS — D649 Anemia, unspecified: Secondary | ICD-10-CM | POA: Diagnosis not present

## 2021-03-29 DIAGNOSIS — I2510 Atherosclerotic heart disease of native coronary artery without angina pectoris: Secondary | ICD-10-CM | POA: Diagnosis not present

## 2021-03-29 DIAGNOSIS — E7849 Other hyperlipidemia: Secondary | ICD-10-CM | POA: Diagnosis not present

## 2021-03-29 DIAGNOSIS — I5022 Chronic systolic (congestive) heart failure: Secondary | ICD-10-CM | POA: Diagnosis not present

## 2021-04-05 DIAGNOSIS — M1000 Idiopathic gout, unspecified site: Secondary | ICD-10-CM | POA: Diagnosis not present

## 2021-04-05 DIAGNOSIS — I493 Ventricular premature depolarization: Secondary | ICD-10-CM | POA: Diagnosis not present

## 2021-04-05 DIAGNOSIS — D693 Immune thrombocytopenic purpura: Secondary | ICD-10-CM | POA: Diagnosis not present

## 2021-04-05 DIAGNOSIS — I480 Paroxysmal atrial fibrillation: Secondary | ICD-10-CM | POA: Diagnosis not present

## 2021-04-05 DIAGNOSIS — R140 Abdominal distension (gaseous): Secondary | ICD-10-CM | POA: Diagnosis not present

## 2021-04-05 DIAGNOSIS — I10 Essential (primary) hypertension: Secondary | ICD-10-CM | POA: Diagnosis not present

## 2021-04-05 DIAGNOSIS — D692 Other nonthrombocytopenic purpura: Secondary | ICD-10-CM | POA: Diagnosis not present

## 2021-04-05 DIAGNOSIS — I5022 Chronic systolic (congestive) heart failure: Secondary | ICD-10-CM | POA: Diagnosis not present

## 2021-04-05 DIAGNOSIS — I2510 Atherosclerotic heart disease of native coronary artery without angina pectoris: Secondary | ICD-10-CM | POA: Diagnosis not present

## 2021-04-05 DIAGNOSIS — K219 Gastro-esophageal reflux disease without esophagitis: Secondary | ICD-10-CM | POA: Diagnosis not present

## 2021-04-05 DIAGNOSIS — R918 Other nonspecific abnormal finding of lung field: Secondary | ICD-10-CM | POA: Diagnosis not present

## 2021-04-05 DIAGNOSIS — E7849 Other hyperlipidemia: Secondary | ICD-10-CM | POA: Diagnosis not present

## 2021-04-05 DIAGNOSIS — I700 Atherosclerosis of aorta: Secondary | ICD-10-CM | POA: Diagnosis not present

## 2021-04-13 DIAGNOSIS — S82002A Unspecified fracture of left patella, initial encounter for closed fracture: Secondary | ICD-10-CM | POA: Diagnosis not present

## 2021-04-13 DIAGNOSIS — S0512XA Contusion of eyeball and orbital tissues, left eye, initial encounter: Secondary | ICD-10-CM | POA: Diagnosis not present

## 2021-05-04 DIAGNOSIS — S82015A Nondisplaced osteochondral fracture of left patella, initial encounter for closed fracture: Secondary | ICD-10-CM | POA: Diagnosis not present

## 2021-07-02 DIAGNOSIS — J019 Acute sinusitis, unspecified: Secondary | ICD-10-CM | POA: Diagnosis not present

## 2021-07-13 ENCOUNTER — Encounter: Payer: Self-pay | Admitting: Internal Medicine

## 2021-07-13 ENCOUNTER — Inpatient Hospital Stay (HOSPITAL_BASED_OUTPATIENT_CLINIC_OR_DEPARTMENT_OTHER): Payer: PPO | Admitting: Internal Medicine

## 2021-07-13 ENCOUNTER — Inpatient Hospital Stay: Payer: PPO | Attending: Internal Medicine

## 2021-07-13 DIAGNOSIS — Z85820 Personal history of malignant melanoma of skin: Secondary | ICD-10-CM | POA: Diagnosis not present

## 2021-07-13 DIAGNOSIS — E785 Hyperlipidemia, unspecified: Secondary | ICD-10-CM | POA: Diagnosis not present

## 2021-07-13 DIAGNOSIS — D693 Immune thrombocytopenic purpura: Secondary | ICD-10-CM | POA: Diagnosis not present

## 2021-07-13 DIAGNOSIS — I110 Hypertensive heart disease with heart failure: Secondary | ICD-10-CM | POA: Diagnosis not present

## 2021-07-13 DIAGNOSIS — Z79899 Other long term (current) drug therapy: Secondary | ICD-10-CM | POA: Insufficient documentation

## 2021-07-13 DIAGNOSIS — I252 Old myocardial infarction: Secondary | ICD-10-CM | POA: Insufficient documentation

## 2021-07-13 DIAGNOSIS — Z7982 Long term (current) use of aspirin: Secondary | ICD-10-CM | POA: Insufficient documentation

## 2021-07-13 DIAGNOSIS — I509 Heart failure, unspecified: Secondary | ICD-10-CM | POA: Diagnosis not present

## 2021-07-13 LAB — CBC WITH DIFFERENTIAL/PLATELET
Abs Immature Granulocytes: 0.14 10*3/uL — ABNORMAL HIGH (ref 0.00–0.07)
Basophils Absolute: 0.1 10*3/uL (ref 0.0–0.1)
Basophils Relative: 1 %
Eosinophils Absolute: 0.3 10*3/uL (ref 0.0–0.5)
Eosinophils Relative: 2 %
HCT: 39.2 % (ref 39.0–52.0)
Hemoglobin: 12.6 g/dL — ABNORMAL LOW (ref 13.0–17.0)
Immature Granulocytes: 1 %
Lymphocytes Relative: 9 %
Lymphs Abs: 1.1 10*3/uL (ref 0.7–4.0)
MCH: 29.4 pg (ref 26.0–34.0)
MCHC: 32.1 g/dL (ref 30.0–36.0)
MCV: 91.4 fL (ref 80.0–100.0)
Monocytes Absolute: 0.9 10*3/uL (ref 0.1–1.0)
Monocytes Relative: 7 %
Neutro Abs: 9.9 10*3/uL — ABNORMAL HIGH (ref 1.7–7.7)
Neutrophils Relative %: 80 %
Platelets: 54 10*3/uL — ABNORMAL LOW (ref 150–400)
RBC: 4.29 MIL/uL (ref 4.22–5.81)
RDW: 15 % (ref 11.5–15.5)
WBC: 12.3 10*3/uL — ABNORMAL HIGH (ref 4.0–10.5)
nRBC: 0 % (ref 0.0–0.2)

## 2021-07-13 LAB — BASIC METABOLIC PANEL
Anion gap: 8 (ref 5–15)
BUN: 18 mg/dL (ref 8–23)
CO2: 25 mmol/L (ref 22–32)
Calcium: 8.5 mg/dL — ABNORMAL LOW (ref 8.9–10.3)
Chloride: 108 mmol/L (ref 98–111)
Creatinine, Ser: 0.97 mg/dL (ref 0.61–1.24)
GFR, Estimated: >60 mL/min (ref >60)
Glucose, Bld: 99 mg/dL (ref 70–99)
Potassium: 4.4 mmol/L (ref 3.5–5.1)
Sodium: 141 mmol/L (ref 135–145)

## 2021-07-13 NOTE — Progress Notes (Signed)
[facility] OFFICE PROGRESS NOTE  Patient Care Team: [name], MD as PCP - General (Internal Medicine)   SUMMARY OF ONCOLOGIC HISTORY:  2012- CHRONIC ITP-[ BMBx; 2012- hypercellular 60%; megakaryocytes/no dyspoiesis; FISH- Neg; Dr.Pandit]; [place]- Abdo-Neg for spleen/liver; HIV/hepatitis-NEG; OCT 2017- Rituxan weekly x4  # July 2017- Melanoma s/p excision [? Stage; Dr.Dasher]; status post lithotripsy [Dr. Bernardo Heater; 2019]; 2019-CT scan incidental liver lesion; likely hemangioma [Jan 2020 CT scan]; 2021- COVID  INTERVAL HISTORY:  86 year old male patient with a history of ITP-on aspirin and Plavix currently on surveillance is here for follow-up.  Denies any unusual bleeding.  No blood in stools or black-colored stools.  Review of Systems  Constitutional: Negative.  Negative for chills, diaphoresis, fever, malaise/fatigue and weight loss.  HENT: Negative.  Negative for nosebleeds and sore throat.   Eyes: Negative.  Negative for double vision.  Respiratory: Negative.  Negative for cough, hemoptysis, sputum production, shortness of breath and wheezing.   Cardiovascular: Negative.  Negative for chest pain, palpitations, orthopnea and leg swelling.  Gastrointestinal: Negative.  Negative for abdominal pain, blood in stool, constipation, diarrhea, heartburn, melena, nausea and vomiting.  Genitourinary: Negative.  Negative for dysuria, frequency and urgency.  Musculoskeletal: Negative.  Negative for back pain and joint pain.  Skin: Negative.  Negative for itching and rash.  Neurological: Negative.  Negative for dizziness, tingling, focal weakness, weakness and headaches.  Endo/Heme/Allergies:  Bruises/bleeds easily.  Psychiatric/Behavioral: Negative.  Negative for depression. The patient is not nervous/anxious and does not have insomnia.     PAST MEDICAL HISTORY :  Past Medical History:  Diagnosis Date  . CAD (coronary artery disease)   . Cancer ([place])    melanoma  .  CHF (congestive heart failure) ([place])    2017 with pneumonia  . Colon polyps   . GERD (gastroesophageal reflux disease)   . Gout   . Hyperlipidemia   . Hypertension   . IDA (iron deficiency anemia)   . Kidney stones   . MI (myocardial infarction) ([place]) 2004  . Mild anemia   . Thrombocytopenia ([place])     PAST SURGICAL HISTORY :   Past Surgical History:  Procedure Laterality Date  . BLADDER SURGERY    . CARDIAC CATHETERIZATION    . CORONARY ARTERY BYPASS GRAFT    . CYSTOSCOPY WITH LITHOLAPAXY N/A 07/05/2018   Procedure: CYSTOSCOPY WITH LITHOLAPAXY;  Surgeon: [name], MD;  Location: [facility] ORS;  Service: Urology;  Laterality: N/A;  . HOLMIUM LASER APPLICATION N/A [id number]   Procedure: HOLMIUM LASER APPLICATION;  Surgeon: [name], MD;  Location: [facility] ORS;  Service: Urology;  Laterality: N/A;  . INGUINAL HERNIA REPAIR Right   . LASER ABLATION     x 2 prostatic hypertrophy  . TONSILLECTOMY      FAMILY HISTORY :   Family History  Problem Relation Age of Onset  . Breast cancer Mother   . Diabetes Father   . Heart disease Father   . Heart disease Brother   . Heart disease Brother   . Prostate cancer Neg Hx   . Bladder Cancer Neg Hx   . Kidney cancer Neg Hx     SOCIAL HISTORY:   Social History   Tobacco Use  . Smoking status: Never  . Smokeless tobacco: Never  Vaping Use  . Vaping Use: Never used  Substance Use Topics  . Alcohol use: No    Alcohol/week: 0.0 standard drinks  . Drug use: No  ALLERGIES:  is allergic to penicillins.  MEDICATIONS:  Current Outpatient Medications  Medication Sig Dispense Refill  . acetaminophen (TYLENOL) 500 MG tablet Take 500 mg by mouth every 6 (six) hours as needed.    Marland Kitchen aspirin EC 81 MG tablet Take 81 mg by mouth every evening.    Marland Kitchen atorvastatin (LIPITOR) 10 MG tablet Take 5 mg by mouth daily at 2 PM.     . azelastine (ASTELIN) 0.1 % nasal spray Place 1 spray into both nostrils 2 (two) times daily as needed for  allergies. Use in each nostril as directed    . CARTIA XT 240 MG 24 hr capsule Take 240 mg by mouth daily.  3  . lisinopril (PRINIVIL,ZESTRIL) 5 MG tablet Take 1 tablet daily by mouth.    . loratadine (CLARITIN) 10 MG tablet Take 10 mg by mouth daily.    . magnesium oxide (MAG-OX) 400 MG tablet magnesium oxide 400 mg (241.3 mg magnesium) tablet  TAKE 1 TABLET BY MOUTH ONCE DAILY    . Multiple Vitamins-Minerals (MULTIVITAMIN WITH MINERALS) tablet Take 1 tablet by mouth daily.    . Multiple Vitamins-Minerals (PRESERVISION AREDS 2 PO) Take 1 tablet by mouth 2 (two) times daily.    . Omega-3 Fatty Acids (FISH OIL) 1200 MG CAPS Take 1,200 mg by mouth daily.    Marland Kitchen omeprazole (PRILOSEC) 20 MG capsule Take 20 mg by mouth daily.    Marland Kitchen triamcinolone (KENALOG) 0.1 % triamcinolone acetonide 0.1 % topical cream  APPLY CREAM EXTERNALLY TWICE DAILY TO AFFECTED AREA AS NEEDED    . metoprolol tartrate (LOPRESSOR) 50 MG tablet Take 1 tablet by mouth in the morning and at bedtime.     No current facility-administered medications for this visit.    PHYSICAL EXAMINATION:   BP (!) 149/67   Pulse 63   Temp (!) 97.4 F (36.3 C)   Resp 18   Wt 182 lb 2 oz (82.6 kg)   SpO2 98%   BMI 25.40 kg/m   Filed Weights   07/13/21 1039  Weight: 182 lb 2 oz (82.6 kg)    Physical Exam Constitutional:      Comments: He is alone.  HENT:     Head: Normocephalic and atraumatic.     Mouth/Throat:     Pharynx: No oropharyngeal exudate.  Eyes:     Pupils: Pupils are equal, round, and reactive to light.  Cardiovascular:     Rate and Rhythm: Normal rate and regular rhythm.  Pulmonary:     Effort: No respiratory distress.     Breath sounds: No wheezing.  Abdominal:     General: Bowel sounds are normal. There is no distension.     Palpations: Abdomen is soft. There is no mass.     Tenderness: no abdominal tenderness There is no guarding or rebound.  Musculoskeletal:        General: No tenderness. Normal range of  motion.     Cervical back: Normal range of motion and neck supple.  Skin:    General: Skin is warm.  Neurological:     Mental Status: He is alert and oriented to person, place, and time.  Psychiatric:        Mood and Affect: Affect normal.    LABORATORY DATA:  I have reviewed the data as listed    Component Value Date/Time   NA 141 07/13/2021 1010   NA 140 12/17/2012 1229   K 4.4 07/13/2021 1010   K 4.4 12/17/2012 1229  CL 108 07/13/2021 1010   CL 106 12/17/2012 1229   CO2 25 07/13/2021 1010   CO2 26 12/17/2012 1229   GLUCOSE 99 07/13/2021 1010   GLUCOSE 77 12/17/2012 1229   BUN 18 07/13/2021 1010   BUN 19 (H) 12/17/2012 1229   CREATININE 0.97 07/13/2021 1010   CREATININE 1.05 12/17/2012 1229   CALCIUM 8.5 (L) 07/13/2021 1010   CALCIUM 8.4 (L) 12/17/2012 1229   PROT 6.8 11/29/2018 0910   ALBUMIN 3.9 11/29/2018 0910   AST 16 11/29/2018 0910   ALT 13 11/29/2018 0910   ALKPHOS 50 11/29/2018 0910   BILITOT 0.6 11/29/2018 0910   GFRNONAA >60 07/13/2021 1010   GFRNONAA >60 12/17/2012 1229   GFRAA >60 07/12/2020 0952   GFRAA >60 12/17/2012 1229    No results found for: SPEP, UPEP  Lab Results  Component Value Date   WBC 12.3 (H) 07/13/2021   NEUTROABS 9.9 (H) 07/13/2021   HGB 12.6 (L) 07/13/2021   HCT 39.2 07/13/2021   MCV 91.4 07/13/2021   PLT 54 (L) 07/13/2021      Chemistry      Component Value Date/Time   NA 141 07/13/2021 1010   NA 140 12/17/2012 1229   K 4.4 07/13/2021 1010   K 4.4 12/17/2012 1229   CL 108 07/13/2021 1010   CL 106 12/17/2012 1229   CO2 25 07/13/2021 1010   CO2 26 12/17/2012 1229   BUN 18 07/13/2021 1010   BUN 19 (H) 12/17/2012 1229   CREATININE 0.97 07/13/2021 1010   CREATININE 1.05 12/17/2012 1229      Component Value Date/Time   CALCIUM 8.5 (L) 07/13/2021 1010   CALCIUM 8.4 (L) 12/17/2012 1229   ALKPHOS 50 11/29/2018 0910   AST 16 11/29/2018 0910   ALT 13 11/29/2018 0910   BILITOT 0.6 11/29/2018 0910        RADIOGRAPHIC STUDIES: I have personally reviewed the radiological images as listed and agreed with the findings in the report. No results found.   ASSESSMENT & PLAN:   Idiopathic thrombocytopenic purpura ([place]) #Chronic ITP-platelets stable between 60-70,000.  Asymptomatic.  Platelets today are 54 continue surveillance. Slowly getting worse.   # As pt is asymptomatic- monitor for Would recommend treatment with thrombopoietin stimulating agent if platelets less than 50 or patient is bleeding.   # CAD - on Asprin [Dr.Fath]  # Dental work: will call us re: clearance [oct 1st]  # DISPOSITION:  # follow up in 3 months with MD/labs- cbc/bmp-Dr.B      Cammie Sickle, MD 07/24/2021 9:52 PM

## 2021-07-13 NOTE — Assessment & Plan Note (Addendum)
#  Chronic ITP-platelets stable between 60-70,000.  Asymptomatic.  Platelets today are 54 continue surveillance. Slowly getting worse.   # As pt is asymptomatic- monitor for Would recommend treatment with thrombopoietin stimulating agent if platelets less than 50 or patient is bleeding.   # CAD - on Asprin [Dr.Fath]  # Dental work: will call us re: clearance [oct 1st]  # DISPOSITION:  # follow up in 3 months with MD/labs- cbc/bmp-Dr.B

## 2021-07-14 DIAGNOSIS — H2513 Age-related nuclear cataract, bilateral: Secondary | ICD-10-CM | POA: Diagnosis not present

## 2021-07-14 DIAGNOSIS — H353132 Nonexudative age-related macular degeneration, bilateral, intermediate dry stage: Secondary | ICD-10-CM | POA: Diagnosis not present

## 2021-10-05 DIAGNOSIS — I2510 Atherosclerotic heart disease of native coronary artery without angina pectoris: Secondary | ICD-10-CM | POA: Diagnosis not present

## 2021-10-05 DIAGNOSIS — I480 Paroxysmal atrial fibrillation: Secondary | ICD-10-CM | POA: Diagnosis not present

## 2021-10-05 DIAGNOSIS — M1000 Idiopathic gout, unspecified site: Secondary | ICD-10-CM | POA: Diagnosis not present

## 2021-10-05 DIAGNOSIS — D693 Immune thrombocytopenic purpura: Secondary | ICD-10-CM | POA: Diagnosis not present

## 2021-10-05 DIAGNOSIS — I10 Essential (primary) hypertension: Secondary | ICD-10-CM | POA: Diagnosis not present

## 2021-10-05 DIAGNOSIS — E7849 Other hyperlipidemia: Secondary | ICD-10-CM | POA: Diagnosis not present

## 2021-10-10 ENCOUNTER — Encounter: Payer: Self-pay | Admitting: Internal Medicine

## 2021-10-10 ENCOUNTER — Inpatient Hospital Stay: Payer: PPO

## 2021-10-10 ENCOUNTER — Inpatient Hospital Stay: Payer: PPO | Attending: Internal Medicine | Admitting: Internal Medicine

## 2021-10-10 ENCOUNTER — Other Ambulatory Visit: Payer: Self-pay

## 2021-10-10 DIAGNOSIS — I110 Hypertensive heart disease with heart failure: Secondary | ICD-10-CM | POA: Insufficient documentation

## 2021-10-10 DIAGNOSIS — I509 Heart failure, unspecified: Secondary | ICD-10-CM | POA: Insufficient documentation

## 2021-10-10 DIAGNOSIS — D693 Immune thrombocytopenic purpura: Secondary | ICD-10-CM | POA: Diagnosis not present

## 2021-10-10 DIAGNOSIS — I2510 Atherosclerotic heart disease of native coronary artery without angina pectoris: Secondary | ICD-10-CM | POA: Insufficient documentation

## 2021-10-10 DIAGNOSIS — Z803 Family history of malignant neoplasm of breast: Secondary | ICD-10-CM | POA: Diagnosis not present

## 2021-10-10 LAB — CBC WITH DIFFERENTIAL/PLATELET
Abs Immature Granulocytes: 0.09 10*3/uL — ABNORMAL HIGH (ref 0.00–0.07)
Basophils Absolute: 0.1 10*3/uL (ref 0.0–0.1)
Basophils Relative: 1 %
Eosinophils Absolute: 0.4 10*3/uL (ref 0.0–0.5)
Eosinophils Relative: 3 %
HCT: 42.2 % (ref 39.0–52.0)
Hemoglobin: 13.6 g/dL (ref 13.0–17.0)
Immature Granulocytes: 1 %
Lymphocytes Relative: 7 %
Lymphs Abs: 0.9 10*3/uL (ref 0.7–4.0)
MCH: 28.5 pg (ref 26.0–34.0)
MCHC: 32.2 g/dL (ref 30.0–36.0)
MCV: 88.3 fL (ref 80.0–100.0)
Monocytes Absolute: 1.1 10*3/uL — ABNORMAL HIGH (ref 0.1–1.0)
Monocytes Relative: 8 %
Neutro Abs: 11.2 10*3/uL — ABNORMAL HIGH (ref 1.7–7.7)
Neutrophils Relative %: 80 %
Platelets: 68 10*3/uL — ABNORMAL LOW (ref 150–400)
RBC: 4.78 MIL/uL (ref 4.22–5.81)
RDW: 14.3 % (ref 11.5–15.5)
WBC: 13.8 10*3/uL — ABNORMAL HIGH (ref 4.0–10.5)
nRBC: 0 % (ref 0.0–0.2)

## 2021-10-10 LAB — BASIC METABOLIC PANEL
Anion gap: 7 (ref 5–15)
BUN: 22 mg/dL (ref 8–23)
CO2: 29 mmol/L (ref 22–32)
Calcium: 8.9 mg/dL (ref 8.9–10.3)
Chloride: 104 mmol/L (ref 98–111)
Creatinine, Ser: 0.94 mg/dL (ref 0.61–1.24)
GFR, Estimated: >60 mL/min (ref >60)
Glucose, Bld: 107 mg/dL — ABNORMAL HIGH (ref 70–99)
Potassium: 4.1 mmol/L (ref 3.5–5.1)
Sodium: 140 mmol/L (ref 135–145)

## 2021-10-10 NOTE — Progress Notes (Signed)
Patient here for oncology follow-up appointment, expresses no complaints or concerns at this time.    

## 2021-10-10 NOTE — Assessment & Plan Note (Addendum)
#  Chronic ITP-platelets stable between 60-70,000.  Asymptomatic.  Platelets today are 48- continue surveillance. Over all STABLE.   # As pt is asymptomatic- monitor for Would recommend treatment with thrombopoietin stimulating agent if platelets less than 50 or patient is bleeding.   # CAD - on Asprin [Dr.Fath]  # Dental work: will call us re: clearance.   # DISPOSITION:  # follow up in 4 months with MD/labs- cbc/bmp-Dr.B

## 2021-10-10 NOTE — Progress Notes (Signed)
[facility] OFFICE PROGRESS NOTE  Patient Care Team: [name], MD as PCP - General (Internal Medicine)   SUMMARY OF ONCOLOGIC HISTORY:  2012- CHRONIC ITP-[ BMBx; 2012- hypercellular 60%; megakaryocytes/no dyspoiesis; FISH- Neg; Dr.Pandit]; [place]- Abdo-Neg for spleen/liver; HIV/hepatitis-NEG; OCT 2017- Rituxan weekly x4  # July 2017- Melanoma s/p excision [? Stage; Dr.Dasher]; status post lithotripsy [Dr. Bernardo Heater; 2019]; 2019-CT scan incidental liver lesion; likely hemangioma [Jan 2020 CT scan]; 2021- COVID  INTERVAL HISTORY: Ambulating independently.  Accompanied by his wife.  86 year old male patient with a history of ITP-on aspirin and Plavix currently on surveillance is here for follow-up.  Denies any new symptoms. Denies any unusual bleeding.  No blood in stools or black-colored stools.  Review of Systems  Constitutional: Negative.  Negative for chills, diaphoresis, fever, malaise/fatigue and weight loss.  HENT: Negative.  Negative for nosebleeds and sore throat.   Eyes: Negative.  Negative for double vision.  Respiratory: Negative.  Negative for cough, hemoptysis, sputum production, shortness of breath and wheezing.   Cardiovascular: Negative.  Negative for chest pain, palpitations, orthopnea and leg swelling.  Gastrointestinal: Negative.  Negative for abdominal pain, blood in stool, constipation, diarrhea, heartburn, melena, nausea and vomiting.  Genitourinary: Negative.  Negative for dysuria, frequency and urgency.  Musculoskeletal: Negative.  Negative for back pain and joint pain.  Skin: Negative.  Negative for itching and rash.  Neurological: Negative.  Negative for dizziness, tingling, focal weakness, weakness and headaches.  Endo/Heme/Allergies:  Bruises/bleeds easily.  Psychiatric/Behavioral: Negative.  Negative for depression. The patient is not nervous/anxious and does not have insomnia.     PAST MEDICAL HISTORY :  Past Medical History:  Diagnosis  Date   CAD (coronary artery disease)    Cancer ([place])    melanoma   CHF (congestive heart failure) ([place])    2017 with pneumonia   Colon polyps    GERD (gastroesophageal reflux disease)    Gout    Hyperlipidemia    Hypertension    IDA (iron deficiency anemia)    Kidney stones    MI (myocardial infarction) ([place]) 2004   Mild anemia    Thrombocytopenia ([facility])     PAST SURGICAL HISTORY :   Past Surgical History:  Procedure Laterality Date   BLADDER SURGERY     CARDIAC CATHETERIZATION     CORONARY ARTERY BYPASS GRAFT     CYSTOSCOPY WITH LITHOLAPAXY N/A 07/05/2018   Procedure: CYSTOSCOPY WITH LITHOLAPAXY;  Surgeon: [name], MD;  Location: [facility] ORS;  Service: Urology;  Laterality: N/A;   HOLMIUM LASER APPLICATION N/A 03/03/961   Procedure: HOLMIUM LASER APPLICATION;  Surgeon: [name], MD;  Location: [facility] ORS;  Service: Urology;  Laterality: N/A;   INGUINAL HERNIA REPAIR Right    LASER ABLATION     x 2 prostatic hypertrophy   TONSILLECTOMY      FAMILY HISTORY :   Family History  Problem Relation Age of Onset   Breast cancer Mother    Diabetes Father    Heart disease Father    Heart disease Brother    Heart disease Brother    Prostate cancer Neg Hx    Bladder Cancer Neg Hx    Kidney cancer Neg Hx     SOCIAL HISTORY:   Social History   Tobacco Use   Smoking status: Never   Smokeless tobacco: Never  Vaping Use   Vaping Use: Never used  Substance Use Topics   Alcohol use: No    Alcohol/week:  0.0 standard drinks   Drug use: No    ALLERGIES:  is allergic to penicillins.  MEDICATIONS:  Current Outpatient Medications  Medication Sig Dispense Refill   acetaminophen (TYLENOL) 500 MG tablet Take 500 mg by mouth every 6 (six) hours as needed.     aspirin EC 81 MG tablet Take 81 mg by mouth every evening.     atorvastatin (LIPITOR) 10 MG tablet Take 5 mg by mouth daily at 2 PM.      azelastine (ASTELIN) 0.1 % nasal spray Place 1 spray into both nostrils 2  (two) times daily as needed for allergies. Use in each nostril as directed     CARTIA XT 240 MG 24 hr capsule Take 240 mg by mouth daily.  3   lisinopril (PRINIVIL,ZESTRIL) 5 MG tablet Take 1 tablet daily by mouth.     loratadine (CLARITIN) 10 MG tablet Take 10 mg by mouth daily.     magnesium oxide (MAG-OX) 400 MG tablet magnesium oxide 400 mg (241.3 mg magnesium) tablet  TAKE 1 TABLET BY MOUTH ONCE DAILY     metoprolol tartrate (LOPRESSOR) 50 MG tablet Take 1 tablet by mouth in the morning and at bedtime.     Multiple Vitamins-Minerals (MULTIVITAMIN WITH MINERALS) tablet Take 1 tablet by mouth daily.     Multiple Vitamins-Minerals (PRESERVISION AREDS 2 PO) Take 1 tablet by mouth 2 (two) times daily.     Omega-3 Fatty Acids (FISH OIL) 1200 MG CAPS Take 1,200 mg by mouth daily.     omeprazole (PRILOSEC) 20 MG capsule Take 20 mg by mouth daily.     triamcinolone (KENALOG) 0.1 % triamcinolone acetonide 0.1 % topical cream  APPLY CREAM EXTERNALLY TWICE DAILY TO AFFECTED AREA AS NEEDED     No current facility-administered medications for this visit.    PHYSICAL EXAMINATION:   BP (!) 141/61 (BP Location: Right Arm, Patient Position: Sitting)   Pulse (!) 56   Temp 97.8 F (36.6 C) (Oral)   Resp 18   Wt 181 lb (82.1 kg)   SpO2 99%   BMI 25.24 kg/m   Filed Weights   10/10/21 1048  Weight: 181 lb (82.1 kg)    Physical Exam Constitutional:      Comments: He is alone.  HENT:     Head: Normocephalic and atraumatic.     Mouth/Throat:     Pharynx: No oropharyngeal exudate.  Eyes:     Pupils: Pupils are equal, round, and reactive to light.  Cardiovascular:     Rate and Rhythm: Normal rate and regular rhythm.  Pulmonary:     Effort: No respiratory distress.     Breath sounds: No wheezing.  Abdominal:     General: Bowel sounds are normal. There is no distension.     Palpations: Abdomen is soft. There is no mass.     Tenderness: There is no abdominal tenderness. There is no guarding  or rebound.  Musculoskeletal:        General: No tenderness. Normal range of motion.     Cervical back: Normal range of motion and neck supple.  Skin:    General: Skin is warm.  Neurological:     Mental Status: He is alert and oriented to person, place, and time.  Psychiatric:        Mood and Affect: Affect normal.    LABORATORY DATA:  I have reviewed the data as listed    Component Value Date/Time   NA 140 10/10/2021 1010  NA 140 12/17/2012 1229   K 4.1 10/10/2021 1010   K 4.4 12/17/2012 1229   CL 104 10/10/2021 1010   CL 106 12/17/2012 1229   CO2 29 10/10/2021 1010   CO2 26 12/17/2012 1229   GLUCOSE 107 (H) 10/10/2021 1010   GLUCOSE 77 12/17/2012 1229   BUN 22 10/10/2021 1010   BUN 19 (H) 12/17/2012 1229   CREATININE 0.94 10/10/2021 1010   CREATININE 1.05 12/17/2012 1229   CALCIUM 8.9 10/10/2021 1010   CALCIUM 8.4 (L) 12/17/2012 1229   PROT 6.8 11/29/2018 0910   ALBUMIN 3.9 11/29/2018 0910   AST 16 11/29/2018 0910   ALT 13 11/29/2018 0910   ALKPHOS 50 11/29/2018 0910   BILITOT 0.6 11/29/2018 0910   GFRNONAA >60 10/10/2021 1010   GFRNONAA >60 12/17/2012 1229   GFRAA >60 07/12/2020 0952   GFRAA >60 12/17/2012 1229    No results found for: SPEP, UPEP  Lab Results  Component Value Date   WBC 13.8 (H) 10/10/2021   NEUTROABS 11.2 (H) 10/10/2021   HGB 13.6 10/10/2021   HCT 42.2 10/10/2021   MCV 88.3 10/10/2021   PLT 68 (L) 10/10/2021      Chemistry      Component Value Date/Time   NA 140 10/10/2021 1010   NA 140 12/17/2012 1229   K 4.1 10/10/2021 1010   K 4.4 12/17/2012 1229   CL 104 10/10/2021 1010   CL 106 12/17/2012 1229   CO2 29 10/10/2021 1010   CO2 26 12/17/2012 1229   BUN 22 10/10/2021 1010   BUN 19 (H) 12/17/2012 1229   CREATININE 0.94 10/10/2021 1010   CREATININE 1.05 12/17/2012 1229      Component Value Date/Time   CALCIUM 8.9 10/10/2021 1010   CALCIUM 8.4 (L) 12/17/2012 1229   ALKPHOS 50 11/29/2018 0910   AST 16 11/29/2018 0910   ALT  13 11/29/2018 0910   BILITOT 0.6 11/29/2018 0910       RADIOGRAPHIC STUDIES: I have personally reviewed the radiological images as listed and agreed with the findings in the report. No results found.   ASSESSMENT & PLAN:   Idiopathic thrombocytopenic purpura ([place]) #Chronic ITP-platelets stable between 60-70,000.  Asymptomatic.  Platelets today are 40- continue surveillance. Over all STABLE.   # As pt is asymptomatic- monitor for Would recommend treatment with thrombopoietin stimulating agent if platelets less than 50 or patient is bleeding.   # CAD - on Asprin [Dr.Fath]  # Dental work: will call us re: clearance.   # DISPOSITION:  # follow up in 4 months with MD/labs- cbc/bmp-Dr.B      Cammie Sickle, MD 10/10/2021 11:39 AM

## 2021-10-12 ENCOUNTER — Other Ambulatory Visit: Payer: PPO

## 2021-10-12 ENCOUNTER — Ambulatory Visit: Payer: PPO | Admitting: Internal Medicine

## 2021-10-12 DIAGNOSIS — I10 Essential (primary) hypertension: Secondary | ICD-10-CM | POA: Diagnosis not present

## 2021-10-12 DIAGNOSIS — I5022 Chronic systolic (congestive) heart failure: Secondary | ICD-10-CM | POA: Diagnosis not present

## 2021-10-12 DIAGNOSIS — I480 Paroxysmal atrial fibrillation: Secondary | ICD-10-CM | POA: Diagnosis not present

## 2021-10-12 DIAGNOSIS — D692 Other nonthrombocytopenic purpura: Secondary | ICD-10-CM | POA: Diagnosis not present

## 2021-10-12 DIAGNOSIS — M1000 Idiopathic gout, unspecified site: Secondary | ICD-10-CM | POA: Diagnosis not present

## 2021-10-12 DIAGNOSIS — Z0000 Encounter for general adult medical examination without abnormal findings: Secondary | ICD-10-CM | POA: Diagnosis not present

## 2021-10-12 DIAGNOSIS — I700 Atherosclerosis of aorta: Secondary | ICD-10-CM | POA: Diagnosis not present

## 2021-10-12 DIAGNOSIS — R809 Proteinuria, unspecified: Secondary | ICD-10-CM | POA: Diagnosis not present

## 2021-10-12 DIAGNOSIS — D693 Immune thrombocytopenic purpura: Secondary | ICD-10-CM | POA: Diagnosis not present

## 2021-10-12 DIAGNOSIS — K219 Gastro-esophageal reflux disease without esophagitis: Secondary | ICD-10-CM | POA: Diagnosis not present

## 2021-10-12 DIAGNOSIS — Z23 Encounter for immunization: Secondary | ICD-10-CM | POA: Diagnosis not present

## 2021-10-12 DIAGNOSIS — I2510 Atherosclerotic heart disease of native coronary artery without angina pectoris: Secondary | ICD-10-CM | POA: Diagnosis not present

## 2021-10-12 DIAGNOSIS — E7849 Other hyperlipidemia: Secondary | ICD-10-CM | POA: Diagnosis not present

## 2021-12-06 DIAGNOSIS — R059 Cough, unspecified: Secondary | ICD-10-CM | POA: Diagnosis not present

## 2021-12-06 DIAGNOSIS — I5022 Chronic systolic (congestive) heart failure: Secondary | ICD-10-CM | POA: Diagnosis not present

## 2021-12-06 DIAGNOSIS — R051 Acute cough: Secondary | ICD-10-CM | POA: Diagnosis not present

## 2021-12-06 DIAGNOSIS — J019 Acute sinusitis, unspecified: Secondary | ICD-10-CM | POA: Diagnosis not present

## 2021-12-06 DIAGNOSIS — Z03818 Encounter for observation for suspected exposure to other biological agents ruled out: Secondary | ICD-10-CM | POA: Diagnosis not present

## 2021-12-13 DIAGNOSIS — J019 Acute sinusitis, unspecified: Secondary | ICD-10-CM | POA: Diagnosis not present

## 2021-12-13 DIAGNOSIS — J40 Bronchitis, not specified as acute or chronic: Secondary | ICD-10-CM | POA: Diagnosis not present

## 2021-12-20 ENCOUNTER — Ambulatory Visit (INDEPENDENT_AMBULATORY_CARE_PROVIDER_SITE_OTHER): Payer: PPO

## 2021-12-20 ENCOUNTER — Other Ambulatory Visit: Payer: Self-pay | Admitting: Podiatry

## 2021-12-20 ENCOUNTER — Encounter: Payer: Self-pay | Admitting: Podiatry

## 2021-12-20 ENCOUNTER — Ambulatory Visit: Payer: PPO | Admitting: Podiatry

## 2021-12-20 ENCOUNTER — Other Ambulatory Visit: Payer: Self-pay

## 2021-12-20 DIAGNOSIS — M7752 Other enthesopathy of left foot: Secondary | ICD-10-CM

## 2021-12-20 DIAGNOSIS — M79674 Pain in right toe(s): Secondary | ICD-10-CM | POA: Diagnosis not present

## 2021-12-20 DIAGNOSIS — M7751 Other enthesopathy of right foot: Secondary | ICD-10-CM

## 2021-12-20 DIAGNOSIS — M79675 Pain in left toe(s): Secondary | ICD-10-CM | POA: Diagnosis not present

## 2021-12-20 DIAGNOSIS — B351 Tinea unguium: Secondary | ICD-10-CM

## 2021-12-20 DIAGNOSIS — M2010 Hallux valgus (acquired), unspecified foot: Secondary | ICD-10-CM

## 2021-12-20 MED ORDER — BETAMETHASONE SOD PHOS & ACET 6 (3-3) MG/ML IJ SUSP: 3.0000 mg | Freq: Once | INTRAMUSCULAR | Status: DC

## 2021-12-20 NOTE — Progress Notes (Signed)
° °  SUBJECTIVE Patient presents to office today complaining of elongated, thickened nails that cause pain while ambulating in shoes.  Patient is unable to trim their own nails.   Patient also states that he has a history of gout to the bilateral great toes.  He does get intermittent pain to the bilateral great toe joints.  He denies a history of injury to these areas.  He is not anything for treatment.  Patient is here for further evaluation and treatment.  Past Medical History:  Diagnosis Date   CAD (coronary artery disease)    Cancer ([place])    melanoma   CHF (congestive heart failure) ([place])    2017 with pneumonia   Colon polyps    GERD (gastroesophageal reflux disease)    Gout    Hyperlipidemia    Hypertension    IDA (iron deficiency anemia)    Kidney stones    MI (myocardial infarction) ([place]) 2004   Mild anemia    Thrombocytopenia ([place])     OBJECTIVE General Patient is awake, alert, and oriented x 3 and in no acute distress. Derm Skin is dry and supple bilateral. Negative open lesions or macerations. Remaining integument unremarkable. Nails are tender, long, thickened and dystrophic with subungual debris, consistent with onychomycosis, 1-5 bilateral. No signs of infection noted. Vasc  DP and PT pedal pulses palpable bilaterally. Temperature gradient within normal limits.  Neuro Epicritic and protective threshold sensation grossly intact bilaterally.  Musculoskeletal Exam No symptomatic pedal deformities noted bilateral. Muscular strength within normal limits.  There are some pain on palpation range of motion to the bilateral great toe joints Radiographic exam no fractures identified.  Diffuse osseous demineralization consistent with generalized osteoporosis.  Diffuse degenerative changes noted throughout the pedal joints of the foot given the patient's age within normal limits.  Metatarsus adductus also noted.  ASSESSMENT 1.  Pain due to onychomycosis of toenails both 2.  First  MTP capsulitis bilateral  PLAN OF CARE 1. Patient evaluated today.  2. Instructed to maintain good pedal hygiene and foot care.  3. Mechanical debridement of nails 1-5 bilaterally performed using a nail nipper. Filed with dremel without incident.  4.  Injection of 0.5 cc Celestone Soluspan injected into the bilateral first MTP joints  5.  Return to clinic in 3 mos.    Edrick Kins, DPM [facility]  Dr. Edrick Kins, DPM    [address], [place] [address]                Office [phone]  Fax [phone]

## 2022-01-13 DIAGNOSIS — H353132 Nonexudative age-related macular degeneration, bilateral, intermediate dry stage: Secondary | ICD-10-CM | POA: Diagnosis not present

## 2022-02-06 ENCOUNTER — Other Ambulatory Visit: Payer: Self-pay

## 2022-02-06 ENCOUNTER — Encounter (INDEPENDENT_AMBULATORY_CARE_PROVIDER_SITE_OTHER): Payer: Self-pay

## 2022-02-06 ENCOUNTER — Encounter: Payer: Self-pay | Admitting: Internal Medicine

## 2022-02-06 ENCOUNTER — Inpatient Hospital Stay (HOSPITAL_BASED_OUTPATIENT_CLINIC_OR_DEPARTMENT_OTHER): Payer: PPO | Admitting: Internal Medicine

## 2022-02-06 ENCOUNTER — Inpatient Hospital Stay: Payer: PPO | Attending: Internal Medicine

## 2022-02-06 DIAGNOSIS — I110 Hypertensive heart disease with heart failure: Secondary | ICD-10-CM | POA: Insufficient documentation

## 2022-02-06 DIAGNOSIS — I509 Heart failure, unspecified: Secondary | ICD-10-CM | POA: Insufficient documentation

## 2022-02-06 DIAGNOSIS — I2510 Atherosclerotic heart disease of native coronary artery without angina pectoris: Secondary | ICD-10-CM | POA: Diagnosis not present

## 2022-02-06 DIAGNOSIS — D693 Immune thrombocytopenic purpura: Secondary | ICD-10-CM

## 2022-02-06 DIAGNOSIS — Z803 Family history of malignant neoplasm of breast: Secondary | ICD-10-CM | POA: Insufficient documentation

## 2022-02-06 LAB — BASIC METABOLIC PANEL
Anion gap: 5 (ref 5–15)
BUN: 18 mg/dL (ref 8–23)
CO2: 26 mmol/L (ref 22–32)
Calcium: 8.6 mg/dL — ABNORMAL LOW (ref 8.9–10.3)
Chloride: 108 mmol/L (ref 98–111)
Creatinine, Ser: 0.99 mg/dL (ref 0.61–1.24)
GFR, Estimated: >60 mL/min (ref >60)
Glucose, Bld: 98 mg/dL (ref 70–99)
Potassium: 3.9 mmol/L (ref 3.5–5.1)
Sodium: 139 mmol/L (ref 135–145)

## 2022-02-06 LAB — CBC WITH DIFFERENTIAL/PLATELET
Abs Immature Granulocytes: 0.09 10*3/uL — ABNORMAL HIGH (ref 0.00–0.07)
Basophils Absolute: 0.1 10*3/uL (ref 0.0–0.1)
Basophils Relative: 1 %
Eosinophils Absolute: 0.4 10*3/uL (ref 0.0–0.5)
Eosinophils Relative: 4 %
HCT: 41 % (ref 39.0–52.0)
Hemoglobin: 13 g/dL (ref 13.0–17.0)
Immature Granulocytes: 1 %
Lymphocytes Relative: 8 %
Lymphs Abs: 1 10*3/uL (ref 0.7–4.0)
MCH: 28.3 pg (ref 26.0–34.0)
MCHC: 31.7 g/dL (ref 30.0–36.0)
MCV: 89.3 fL (ref 80.0–100.0)
Monocytes Absolute: 0.9 10*3/uL (ref 0.1–1.0)
Monocytes Relative: 7 %
Neutro Abs: 9.6 10*3/uL — ABNORMAL HIGH (ref 1.7–7.7)
Neutrophils Relative %: 79 %
Platelets: 64 10*3/uL — ABNORMAL LOW (ref 150–400)
RBC: 4.59 MIL/uL (ref 4.22–5.81)
RDW: 15.5 % (ref 11.5–15.5)
WBC: 12 10*3/uL — ABNORMAL HIGH (ref 4.0–10.5)
nRBC: 0 % (ref 0.0–0.2)

## 2022-02-06 NOTE — Progress Notes (Signed)
Welcome OFFICE PROGRESS NOTE  Patient Care Team: [name], MD as PCP - General (Internal Medicine)   SUMMARY OF ONCOLOGIC HISTORY:  2012- CHRONIC ITP-[ BMBx; 2012- hypercellular 60%; megakaryocytes/no dyspoiesis; FISH- Neg; Dr.Pandit]; [place]- Abdo-Neg for spleen/liver; HIV/hepatitis-NEG; OCT 2017- Rituxan weekly x4  # July 2017- Melanoma s/p excision [? Stage; Dr.Dasher]; status post lithotripsy [Dr. Bernardo Heater; 2019]; 2019-CT scan incidental liver lesion; likely hemangioma [Jan 2020 CT scan]; 2021- COVID  INTERVAL HISTORY: Ambulating independently.  Accompanied by his wife.  87 year old male patient with a history of ITP-on aspirin and Plavix currently on surveillance is here for follow-up.  Patient currently awaiting cataract surgery.  Otherwise denies any new symptoms. Denies any unusual bleeding.  No blood in stools or black-colored stools.  Review of Systems  Constitutional: Negative.  Negative for chills, diaphoresis, fever, malaise/fatigue and weight loss.  HENT: Negative.  Negative for nosebleeds and sore throat.   Eyes: Negative.  Negative for double vision.  Respiratory: Negative.  Negative for cough, hemoptysis, sputum production, shortness of breath and wheezing.   Cardiovascular: Negative.  Negative for chest pain, palpitations, orthopnea and leg swelling.  Gastrointestinal: Negative.  Negative for abdominal pain, blood in stool, constipation, diarrhea, heartburn, melena, nausea and vomiting.  Genitourinary: Negative.  Negative for dysuria, frequency and urgency.  Musculoskeletal: Negative.  Negative for back pain and joint pain.  Skin: Negative.  Negative for itching and rash.  Neurological: Negative.  Negative for dizziness, tingling, focal weakness, weakness and headaches.  Endo/Heme/Allergies:  Bruises/bleeds easily.  Psychiatric/Behavioral: Negative.  Negative for depression. The patient is not nervous/anxious and does not have insomnia.      PAST MEDICAL HISTORY :  Past Medical History:  Diagnosis Date   CAD (coronary artery disease)    Cancer ([place])    melanoma   CHF (congestive heart failure) ([place])    2017 with pneumonia   Colon polyps    GERD (gastroesophageal reflux disease)    Gout    Hyperlipidemia    Hypertension    IDA (iron deficiency anemia)    Kidney stones    MI (myocardial infarction) ([place]) 2004   Mild anemia    Thrombocytopenia ([facility])     PAST SURGICAL HISTORY :   Past Surgical History:  Procedure Laterality Date   BLADDER SURGERY     CARDIAC CATHETERIZATION     CORONARY ARTERY BYPASS GRAFT     CYSTOSCOPY WITH LITHOLAPAXY N/A 07/05/2018   Procedure: CYSTOSCOPY WITH LITHOLAPAXY;  Surgeon: [name], MD;  Location: [facility] ORS;  Service: Urology;  Laterality: N/A;   HOLMIUM LASER APPLICATION N/A 07/28/6605   Procedure: HOLMIUM LASER APPLICATION;  Surgeon: [name], MD;  Location: [facility] ORS;  Service: Urology;  Laterality: N/A;   INGUINAL HERNIA REPAIR Right    LASER ABLATION     x 2 prostatic hypertrophy   TONSILLECTOMY      FAMILY HISTORY :   Family History  Problem Relation Age of Onset   Breast cancer Mother    Diabetes Father    Heart disease Father    Heart disease Brother    Heart disease Brother    Prostate cancer Neg Hx    Bladder Cancer Neg Hx    Kidney cancer Neg Hx     SOCIAL HISTORY:   Social History   Tobacco Use   Smoking status: Never   Smokeless tobacco: Never  Vaping Use   Vaping Use: Never used  Substance Use Topics  Alcohol use: No    Alcohol/week: 0.0 standard drinks   Drug use: No    ALLERGIES:  is allergic to penicillins.  MEDICATIONS:  Current Outpatient Medications  Medication Sig Dispense Refill   acetaminophen (TYLENOL) 500 MG tablet Take 500 mg by mouth every 6 (six) hours as needed.     albuterol (VENTOLIN HFA) 108 (90 Base) MCG/ACT inhaler SMARTSIG:2 Puff(s) By Mouth Every 6 Hours PRN     aspirin EC 81 MG tablet Take 81 mg by mouth  every evening.     atorvastatin (LIPITOR) 10 MG tablet Take 5 mg by mouth daily at 2 PM.      azelastine (ASTELIN) 0.1 % nasal spray Place into the nose.     CARTIA XT 240 MG 24 hr capsule Take 240 mg by mouth daily.  3   diltiazem (TIAZAC) 240 MG 24 hr capsule Take by mouth.     lisinopril (PRINIVIL,ZESTRIL) 5 MG tablet Take 1 tablet daily by mouth.     lisinopril (ZESTRIL) 10 MG tablet Take 10 mg by mouth daily.     loratadine (CLARITIN) 10 MG tablet Take 10 mg by mouth daily.     magnesium oxide (MAG-OX) 400 MG tablet magnesium oxide 400 mg (241.3 mg magnesium) tablet  TAKE 1 TABLET BY MOUTH ONCE DAILY     metoprolol tartrate (LOPRESSOR) 50 MG tablet Take 1 tablet by mouth in the morning and at bedtime.     Multiple Vitamins-Minerals (MULTIVITAMIN WITH MINERALS) tablet Take 1 tablet by mouth daily.     Multiple Vitamins-Minerals (PRESERVISION AREDS 2 PO) Take 1 tablet by mouth 2 (two) times daily.     Omega-3 Fatty Acids (FISH OIL) 1200 MG CAPS Take 1,200 mg by mouth daily.     omeprazole (PRILOSEC) 20 MG capsule Take 20 mg by mouth daily.     triamcinolone (KENALOG) 0.1 % triamcinolone acetonide 0.1 % topical cream  APPLY CREAM EXTERNALLY TWICE DAILY TO AFFECTED AREA AS NEEDED     doxycycline (VIBRAMYCIN) 100 MG capsule Take 100 mg by mouth 2 (two) times daily. (Patient not taking: Reported on 02/06/2022)     Current Facility-Administered Medications  Medication Dose Route Frequency Provider Last Rate Last Admin   betamethasone acetate-betamethasone sodium phosphate (CELESTONE) injection 3 mg  3 mg Intra-articular Once Edrick Kins, DPM        PHYSICAL EXAMINATION:   BP (!) 167/41    Pulse (!) 30    Temp 98.7 F (37.1 C)    Resp 20    Wt 178 lb (80.7 kg)    SpO2 100%    BMI 24.83 kg/m   Filed Weights   02/06/22 1022  Weight: 178 lb (80.7 kg)    Physical Exam Constitutional:      Comments: He is alone.  HENT:     Head: Normocephalic and atraumatic.     Mouth/Throat:      Pharynx: No oropharyngeal exudate.  Eyes:     Pupils: Pupils are equal, round, and reactive to light.  Cardiovascular:     Rate and Rhythm: Normal rate and regular rhythm.  Pulmonary:     Effort: No respiratory distress.     Breath sounds: No wheezing.  Abdominal:     General: Bowel sounds are normal. There is no distension.     Palpations: Abdomen is soft. There is no mass.     Tenderness: There is no abdominal tenderness. There is no guarding or rebound.  Musculoskeletal:  General: No tenderness. Normal range of motion.     Cervical back: Normal range of motion and neck supple.  Skin:    General: Skin is warm.  Neurological:     Mental Status: He is alert and oriented to person, place, and time.  Psychiatric:        Mood and Affect: Affect normal.    LABORATORY DATA:  I have reviewed the data as listed    Component Value Date/Time   NA 139 02/06/2022 1007   NA 140 12/17/2012 1229   K 3.9 02/06/2022 1007   K 4.4 12/17/2012 1229   CL 108 02/06/2022 1007   CL 106 12/17/2012 1229   CO2 26 02/06/2022 1007   CO2 26 12/17/2012 1229   GLUCOSE 98 02/06/2022 1007   GLUCOSE 77 12/17/2012 1229   BUN 18 02/06/2022 1007   BUN 19 (H) 12/17/2012 1229   CREATININE 0.99 02/06/2022 1007   CREATININE 1.05 12/17/2012 1229   CALCIUM 8.6 (L) 02/06/2022 1007   CALCIUM 8.4 (L) 12/17/2012 1229   PROT 6.8 11/29/2018 0910   ALBUMIN 3.9 11/29/2018 0910   AST 16 11/29/2018 0910   ALT 13 11/29/2018 0910   ALKPHOS 50 11/29/2018 0910   BILITOT 0.6 11/29/2018 0910   GFRNONAA >60 02/06/2022 1007   GFRNONAA >60 12/17/2012 1229   GFRAA >60 07/12/2020 0952   GFRAA >60 12/17/2012 1229    No results found for: SPEP, UPEP  Lab Results  Component Value Date   WBC 12.0 (H) 02/06/2022   NEUTROABS 9.6 (H) 02/06/2022   HGB 13.0 02/06/2022   HCT 41.0 02/06/2022   MCV 89.3 02/06/2022   PLT 64 (L) 02/06/2022      Chemistry      Component Value Date/Time   NA 139 02/06/2022 1007   NA 140  12/17/2012 1229   K 3.9 02/06/2022 1007   K 4.4 12/17/2012 1229   CL 108 02/06/2022 1007   CL 106 12/17/2012 1229   CO2 26 02/06/2022 1007   CO2 26 12/17/2012 1229   BUN 18 02/06/2022 1007   BUN 19 (H) 12/17/2012 1229   CREATININE 0.99 02/06/2022 1007   CREATININE 1.05 12/17/2012 1229      Component Value Date/Time   CALCIUM 8.6 (L) 02/06/2022 1007   CALCIUM 8.4 (L) 12/17/2012 1229   ALKPHOS 50 11/29/2018 0910   AST 16 11/29/2018 0910   ALT 13 11/29/2018 0910   BILITOT 0.6 11/29/2018 0910       RADIOGRAPHIC STUDIES: I have personally reviewed the radiological images as listed and agreed with the findings in the report. No results found.   ASSESSMENT & PLAN:   Idiopathic thrombocytopenic purpura ([place]) #Chronic ITP-platelets stable between 60-70,000.  Asymptomatic.  Platelets today are 20- continue surveillance. Over all STABLE.   # As pt is asymptomatic- monitor for Would recommend treatment with thrombopoietin stimulating agent if platelets less than 50 or patient is bleeding.   # CAD - on Asprin [Dr.Fath]  #Cataract surgery-platelets 64-okay to proceed with surgery.  # DISPOSITION:  # in 3 months- lab- cbc # follow up in 6 months with MD/labs- cbc/bmp-Dr.B      Cammie Sickle, MD 02/06/2022 10:57 AM

## 2022-02-06 NOTE — Assessment & Plan Note (Addendum)
#  Chronic ITP-platelets stable between 60-70,000.  Asymptomatic.  Platelets today are 35- continue surveillance. Over all STABLE.  ? ?# As pt is asymptomatic- monitor for Would recommend treatment with thrombopoietin stimulating agent if platelets less than 50 or patient is bleeding.  ? ?# CAD - on Asprin [Dr.Fath] ? ?#Cataract surgery-platelets 64-okay to proceed with surgery. ? ?# DISPOSITION:  ?# in 3 months- lab- cbc ?# follow up in 6 months with MD/labs- cbc/bmp-Dr.B ?

## 2022-02-07 DIAGNOSIS — H2512 Age-related nuclear cataract, left eye: Secondary | ICD-10-CM | POA: Diagnosis not present

## 2022-02-15 ENCOUNTER — Encounter: Payer: Self-pay | Admitting: Ophthalmology

## 2022-02-20 NOTE — Discharge Instructions (Signed)

## 2022-02-22 ENCOUNTER — Ambulatory Visit
Admission: RE | Admit: 2022-02-22 | Discharge: 2022-02-22 | Disposition: A | Discharge status: Home / Self Care | Payer: PPO | Attending: Ophthalmology | Admitting: Ophthalmology

## 2022-02-22 ENCOUNTER — Ambulatory Visit: Payer: PPO | Admitting: Anesthesiology

## 2022-02-22 ENCOUNTER — Other Ambulatory Visit: Payer: Self-pay

## 2022-02-22 ENCOUNTER — Encounter
Admission: RE | Disposition: A | Discharge status: Home / Self Care | Payer: Self-pay | Source: Home / Self Care | Attending: Ophthalmology

## 2022-02-22 DIAGNOSIS — I252 Old myocardial infarction: Secondary | ICD-10-CM | POA: Insufficient documentation

## 2022-02-22 DIAGNOSIS — Z79899 Other long term (current) drug therapy: Secondary | ICD-10-CM | POA: Diagnosis not present

## 2022-02-22 DIAGNOSIS — E785 Hyperlipidemia, unspecified: Secondary | ICD-10-CM | POA: Diagnosis not present

## 2022-02-22 DIAGNOSIS — Z951 Presence of aortocoronary bypass graft: Secondary | ICD-10-CM | POA: Diagnosis not present

## 2022-02-22 DIAGNOSIS — I110 Hypertensive heart disease with heart failure: Secondary | ICD-10-CM | POA: Diagnosis not present

## 2022-02-22 DIAGNOSIS — H2511 Age-related nuclear cataract, right eye: Secondary | ICD-10-CM | POA: Insufficient documentation

## 2022-02-22 DIAGNOSIS — I509 Heart failure, unspecified: Secondary | ICD-10-CM | POA: Diagnosis not present

## 2022-02-22 DIAGNOSIS — I2510 Atherosclerotic heart disease of native coronary artery without angina pectoris: Secondary | ICD-10-CM | POA: Insufficient documentation

## 2022-02-22 DIAGNOSIS — K219 Gastro-esophageal reflux disease without esophagitis: Secondary | ICD-10-CM | POA: Diagnosis not present

## 2022-02-22 DIAGNOSIS — H2512 Age-related nuclear cataract, left eye: Secondary | ICD-10-CM | POA: Diagnosis not present

## 2022-02-22 DIAGNOSIS — Z87891 Personal history of nicotine dependence: Secondary | ICD-10-CM | POA: Insufficient documentation

## 2022-02-22 DIAGNOSIS — H25811 Combined forms of age-related cataract, right eye: Secondary | ICD-10-CM | POA: Diagnosis not present

## 2022-02-22 HISTORY — DX: Presence of external hearing-aid: Z97.4

## 2022-02-22 HISTORY — PX: CATARACT EXTRACTION W/PHACO: SHX586

## 2022-02-22 HISTORY — DX: Nausea with vomiting, unspecified: R11.2

## 2022-02-22 HISTORY — DX: Other specified postprocedural states: Z98.890

## 2022-02-22 SURGERY — PHACOEMULSIFICATION, CATARACT, WITH IOL INSERTION
Anesthesia: Monitor Anesthesia Care | Site: Eye | Laterality: Right

## 2022-02-22 MED ORDER — ARMC OPHTHALMIC DILATING DROPS
1.0000 "application " | OPHTHALMIC | Status: DC | PRN
  Administered 2022-02-22 (×3): 1 via OPHTHALMIC

## 2022-02-22 MED ORDER — SIGHTPATH DOSE#1 BSS IO SOLN
INTRAOCULAR | Status: DC | PRN
  Administered 2022-02-22: 87 mL via OPHTHALMIC

## 2022-02-22 MED ORDER — SIGHTPATH DOSE#1 BSS IO SOLN
INTRAOCULAR | Status: DC | PRN
  Administered 2022-02-22: 1 mL via INTRAMUSCULAR

## 2022-02-22 MED ORDER — BRIMONIDINE TARTRATE-TIMOLOL 0.2-0.5 % OP SOLN
OPHTHALMIC | Status: DC | PRN
Start: 2022-02-22 — End: 2022-02-22
  Administered 2022-02-22: 1 [drp] via OPHTHALMIC

## 2022-02-22 MED ORDER — TETRACAINE HCL 0.5 % OP SOLN
1.0000 [drp] | OPHTHALMIC | Status: DC | PRN
  Administered 2022-02-22 (×3): 1 [drp] via OPHTHALMIC

## 2022-02-22 MED ORDER — MIDAZOLAM HCL 2 MG/2ML IJ SOLN
INTRAMUSCULAR | Status: DC | PRN
  Administered 2022-02-22: 1 mg via INTRAVENOUS

## 2022-02-22 MED ORDER — SIGHTPATH DOSE#1 BSS IO SOLN
INTRAOCULAR | Status: DC | PRN
  Administered 2022-02-22: 15 mL

## 2022-02-22 MED ORDER — ONDANSETRON HCL 4 MG/2ML IJ SOLN: 4.0000 mg | Freq: Once | INTRAMUSCULAR | Status: DC | PRN

## 2022-02-22 MED ORDER — SIGHTPATH DOSE#1 NA HYALUR & NA CHOND-NA HYALUR IO KIT
PACK | INTRAOCULAR | Status: DC | PRN
  Administered 2022-02-22: 1 via OPHTHALMIC

## 2022-02-22 MED ORDER — MOXIFLOXACIN HCL 0.5 % OP SOLN
OPHTHALMIC | Status: DC | PRN
  Administered 2022-02-22: 0.2 mL via OPHTHALMIC

## 2022-02-22 MED ORDER — FENTANYL CITRATE (PF) 100 MCG/2ML IJ SOLN
INTRAMUSCULAR | Status: DC | PRN
  Administered 2022-02-22: 50 ug via INTRAVENOUS

## 2022-02-22 MED ORDER — LACTATED RINGERS IV SOLN: INTRAVENOUS | Status: DC

## 2022-02-22 MED ORDER — ACETAMINOPHEN 10 MG/ML IV SOLN: 1000.0000 mg | Freq: Once | INTRAVENOUS | Status: DC | PRN

## 2022-02-22 SURGICAL SUPPLY — 11 items
CATARACT SUITE SIGHTPATH (MISCELLANEOUS) ×2 IMPLANT
FEE CATARACT SUITE SIGHTPATH (MISCELLANEOUS) ×1 IMPLANT
GLOVE SRG 8 PF TXTR STRL LF DI (GLOVE) ×1 IMPLANT
GLOVE SURG ENC TEXT LTX SZ7.5 (GLOVE) ×2 IMPLANT
GLOVE SURG UNDER POLY LF SZ8 (GLOVE) ×2
LENS IOL TECNIS EYHANCE 22.0 (Intraocular Lens) ×1 IMPLANT
NDL FILTER BLUNT 18X1 1/2 (NEEDLE) ×1 IMPLANT
NEEDLE FILTER BLUNT 18X 1/2SAF (NEEDLE) ×1
NEEDLE FILTER BLUNT 18X1 1/2 (NEEDLE) ×1 IMPLANT
SYR 3ML LL SCALE MARK (SYRINGE) ×2 IMPLANT
WATER STERILE IRR 250ML POUR (IV SOLUTION) ×2 IMPLANT

## 2022-02-22 NOTE — Transfer of Care (Signed)
Immediate Anesthesia Transfer of Care Note ? ?Patient: Levi Figueroa ? ?Procedure(s) Performed: CATARACT EXTRACTION PHACO AND INTRAOCULAR LENS PLACEMENT (IOC) RIGHT 11.87 01:17.3 (Right: Eye) ? ?Patient Location: PACU ? ?Anesthesia Type: MAC ? ?Level of Consciousness: awake, alert  and patient cooperative ? ?Airway and Oxygen Therapy: Patient Spontanous Breathing and Patient connected to supplemental oxygen ? ?Post-op Assessment: Post-op Vital signs reviewed, Patient's Cardiovascular Status Stable, Respiratory Function Stable, Patent Airway and No signs of Nausea or vomiting ? ?Post-op Vital Signs: Reviewed and stable ? ?Complications: No notable events documented. ? ?

## 2022-02-22 NOTE — Anesthesia Procedure Notes (Signed)
Procedure Name: [place] ?Date/Time: 02/22/2022 9:21 AM ?Performed by: Jeannene Patella, CRNA ?Pre-anesthesia Checklist: Patient identified, Emergency Drugs available, Suction available, Timeout performed and Patient being monitored ?Patient Re-evaluated:Patient Re-evaluated prior to induction ?Oxygen Delivery Method: Nasal cannula ?Placement Confirmation: positive ETCO2 ? ? ? ? ?

## 2022-02-22 NOTE — Anesthesia Postprocedure Evaluation (Signed)
Anesthesia Post Note ? ?Patient: Levi Figueroa ? ?Procedure(s) Performed: CATARACT EXTRACTION PHACO AND INTRAOCULAR LENS PLACEMENT (IOC) RIGHT 11.87 01:17.3 (Right: Eye) ? ? ?  ?Patient location during evaluation: PACU ?Anesthesia Type: MAC ?Level of consciousness: awake and alert ?Pain management: pain level controlled ?Vital Signs Assessment: post-procedure vital signs reviewed and stable ?Respiratory status: spontaneous breathing, nonlabored ventilation, respiratory function stable and patient connected to nasal cannula oxygen ?Cardiovascular status: stable and blood pressure returned to baseline ?Postop Assessment: no apparent nausea or vomiting ?Anesthetic complications: no ? ? ?No notable events documented. ? ?Levi Figueroa  Cynthis Purington ? ? ? ? ? ?

## 2022-02-22 NOTE — Op Note (Signed)
?  LOCATION:  [place] ?  ?PREOPERATIVE DIAGNOSIS:    Nuclear sclerotic cataract right eye. H25.11 ?  ?POSTOPERATIVE DIAGNOSIS:  Nuclear sclerotic cataract right eye.   ?  ?PROCEDURE:  Phacoemusification with posterior chamber intraocular lens placement of the right eye  ? ?ULTRASOUND TIME: Procedure(s): ?CATARACT EXTRACTION PHACO AND INTRAOCULAR LENS PLACEMENT (IOC) RIGHT 11.87 01:17.3 (Right) ? ?LENS:   ?Implant Name Type Inv. Item Serial No. Manufacturer Lot No. LRB No. Used Action  ?LENS IOL TECNIS EYHANCE 22.0 - [id number] Intraocular Lens LENS IOL TECNIS EYHANCE 22.0 [id number] [employer]  Right 1 Implanted  ?   ?  ?  ?SURGEON:  Wyonia Hough, MD ?  ?ANESTHESIA:  Topical with tetracaine drops and 2% Xylocaine jelly, augmented with 1% preservative-free intracameral lidocaine. ? ?  ?COMPLICATIONS:  None. ?  ?DESCRIPTION OF PROCEDURE:  The patient was identified in the holding room and transported to the operating room and placed in the supine position under the operating microscope.  The right eye was identified as the operative eye and it was prepped and draped in the usual sterile ophthalmic fashion. ?  ?A 1 millimeter clear-corneal paracentesis was made at the 12:00 position.  0.5 ml of preservative-free 1% lidocaine was injected into the anterior chamber. ?The anterior chamber was filled with Viscoat viscoelastic.  A 2.4 millimeter keratome was used to make a near-clear corneal incision at the 9:00 position.  A curvilinear capsulorrhexis was made with a cystotome and capsulorrhexis forceps.  Balanced salt solution was used to hydrodissect and hydrodelineate the nucleus. ?  ?Phacoemulsification was then used in stop and chop fashion to remove the lens nucleus and epinucleus.  The remaining cortex was then removed using the irrigation and aspiration handpiece. Provisc was then placed into the capsular bag to distend it for lens placement.  A lens was then injected into the capsular bag.   The remaining viscoelastic was aspirated. ?  ?Wounds were hydrated with balanced salt solution.  The anterior chamber was inflated to a physiologic pressure with balanced salt solution.  No wound leaks were noted. Vigamox 0.2 ml of a '1mg'$  per ml solution was injected into the anterior chamber for a dose of 0.2 mg of intracameral antibiotic at the completion of the case. ?Timolol and Brimonidine drops were applied to the eye.  The patient was taken to the recovery room in stable condition without complications of anesthesia or surgery. ? ? ?Kennan Detter ?02/22/2022, 9:36 AM ? ?

## 2022-02-22 NOTE — Anesthesia Preprocedure Evaluation (Addendum)
Anesthesia Evaluation  ?Patient identified by MRN, date of birth, ID band ?Patient awake ? ? ? ?Reviewed: ?Allergy & Precautions, NPO status , Patient's Chart, lab work & pertinent test results, reviewed documented beta blocker date and time  ? ?History of Anesthesia Complications ?(+) PONV and history of anesthetic complications ? ?Airway ?Mallampati: III ? ?TM Distance: <3 FB ?Neck ROM: Limited ? ? ? Dental ? ?(+)  ?  ?Pulmonary ?former smoker,  ?  ?breath sounds clear to auscultation ? ? ? ? ? ? Cardiovascular ?hypertension, (-) angina+ CAD, + Past MI (2004), + CABG (2004) and +CHF  ?(-) DOE + dysrhythmias Atrial Fibrillation  ?Rhythm:Regular Rate:Normal ? ? ?HLD ?  ?Neuro/Psych ?  ? GI/Hepatic ?GERD  ,  ?Endo/Other  ? ? Renal/GU ?Renal disease ([place])  ? ?  ?Musculoskeletal ? ? Abdominal ?  ?Peds ? Hematology ? ?(+) Blood dyscrasia, anemia ,  ?MGUS ?ITP on ASA & plavix   ?Anesthesia Other Findings ?Gout ?Melanoma ? Reproductive/Obstetrics ? ?  ? ? ? ? ? ? ? ? ? ? ? ? ? ?  ?  ? ? ? ? ? ? ? ? ?Anesthesia Physical ?Anesthesia Plan ? ?ASA: 3 ? ?Anesthesia Plan: MAC  ? ?Post-op Pain Management:   ? ?Induction: Intravenous ? ?PONV Risk Score and Plan: 2 and TIVA, Midazolam, Treatment may vary due to age or medical condition and Ondansetron ? ?Airway Management Planned: Nasal Cannula ? ?Additional Equipment:  ? ?Intra-op Plan:  ? ?Post-operative Plan:  ? ?Informed Consent: I have reviewed the patients History and Physical, chart, labs and discussed the procedure including the risks, benefits and alternatives for the proposed anesthesia with the patient or authorized representative who has indicated his/her understanding and acceptance.  ? ? ? ? ? ?Plan Discussed with: CRNA and Anesthesiologist ? ?Anesthesia Plan Comments:   ? ? ? ? ? ?Anesthesia Quick Evaluation ? ?

## 2022-02-22 NOTE — H&P (Signed)
?[facility]  ? ?Primary Care Physician:  [name], MD ?Ophthalmologist: Dr. Leandrew Koyanagi ? ?Pre-Procedure History & Physical: ?HPI:  Levi Figueroa is a 87 y.o. male here for ophthalmic surgery. ?  ?Past Medical History:  ?Diagnosis Date  ? CAD (coronary artery disease)   ? Cancer [facility])   ? melanoma  ? CHF (congestive heart failure) ([place])   ? 2017 with pneumonia  ? Colon polyps   ? GERD (gastroesophageal reflux disease)   ? Gout   ? Hyperlipidemia   ? Hypertension   ? IDA (iron deficiency anemia)   ? Kidney stones   ? MI (myocardial infarction) ([place]) 2004  ? Mild anemia   ? PONV (postoperative nausea and vomiting)   ? Thrombocytopenia ([place])   ? followed by oncology and PCP  ? Wears hearing aid in both ears   ? ? ?Past Surgical History:  ?Procedure Laterality Date  ? BLADDER SURGERY    ? CARDIAC CATHETERIZATION    ? CORONARY ARTERY BYPASS GRAFT  2004  ? 5 vessel  ? CYSTOSCOPY WITH LITHOLAPAXY N/A 07/05/2018  ? Procedure: CYSTOSCOPY WITH LITHOLAPAXY;  Surgeon: [name], MD;  Location: [facility] ORS;  Service: Urology;  Laterality: N/A;  ? HOLMIUM LASER APPLICATION N/A 10/93/2355  ? Procedure: HOLMIUM LASER APPLICATION;  Surgeon: [name], MD;  Location: [facility] ORS;  Service: Urology;  Laterality: N/A;  ? INGUINAL HERNIA REPAIR Right   ? LASER ABLATION    ? x 2 prostatic hypertrophy  ? TONSILLECTOMY    ? ? ?Prior to Admission medications   ?Medication Sig Start Date End Date Taking? Authorizing Provider  ?acetaminophen (TYLENOL) 500 MG tablet Take 500 mg by mouth every 6 (six) hours as needed.   Yes [provider]  ?albuterol (VENTOLIN HFA) 108 (90 Base) MCG/ACT inhaler SMARTSIG:2 Puff(s) By Mouth Every 6 Hours PRN 12/13/21  Yes [provider]  ?aspirin EC 81 MG tablet Take 81 mg by mouth every evening.   Yes [provider]  ?atorvastatin (LIPITOR) 10 MG tablet Take 5 mg by mouth daily at 2 PM.    Yes [provider]  ?azelastine (ASTELIN) 0.1 %  nasal spray Place into the nose. 12/06/21  Yes [provider]  ?CARTIA XT 240 MG 24 hr capsule Take 240 mg by mouth daily. 05/25/18  Yes [provider]  ?cetirizine (ZYRTEC) 10 MG tablet Take 10 mg by mouth daily.   Yes [provider]  ?Cholecalciferol (VITAMIN D-3 PO) Take by mouth.   Yes [provider]  ?diltiazem (TIAZAC) 240 MG 24 hr capsule Take by mouth. 11/09/21  Yes [provider]  ?doxycycline (VIBRAMYCIN) 100 MG capsule Take 100 mg by mouth 2 (two) times daily. 12/13/21  Yes [provider]  ?lisinopril (ZESTRIL) 10 MG tablet Take 10 mg by mouth daily. 11/09/21  Yes [provider]  ?magnesium oxide (MAG-OX) 400 MG tablet magnesium oxide 400 mg (241.3 mg magnesium) tablet ? TAKE 1 TABLET BY MOUTH ONCE DAILY 11/08/20  Yes [provider]  ?metoprolol tartrate (LOPRESSOR) 50 MG tablet Take 1 tablet by mouth in the morning and at bedtime. 10/02/19 02/22/22 Yes [provider]  ?Multiple Vitamins-Minerals (MULTIVITAMIN WITH MINERALS) tablet Take 1 tablet by mouth daily.   Yes [provider]  ?Multiple Vitamins-Minerals (PRESERVISION AREDS 2 PO) Take 1 tablet by mouth 2 (two) times daily.   Yes [provider]  ?Omega-3 Fatty Acids (FISH OIL) 1200 MG CAPS  Take 1,200 mg by mouth daily.   Yes [provider]  ?omeprazole (PRILOSEC) 20 MG capsule Take 20 mg by mouth daily.   Yes [provider]  ?psyllium (METAMUCIL) 58.6 % powder Take 1 packet by mouth at bedtime.   Yes [provider]  ?vitamin B-12 (CYANOCOBALAMIN) 1000 MCG tablet Take 1,000 mcg by mouth daily.   Yes [provider]  ?loratadine (CLARITIN) 10 MG tablet Take 10 mg by mouth daily. ?Patient not taking: Reported on 02/15/2022    [provider]  ?triamcinolone (KENALOG) 0.1 % triamcinolone acetonide 0.1 % topical cream ? APPLY CREAM EXTERNALLY TWICE DAILY TO AFFECTED AREA AS NEEDED    [provider]  ? ? ?Allergies as of 01/16/2022 - Review Complete 12/20/2021  ?Allergen Reaction Noted  ? Penicillins Swelling 12/29/2015  ? ? ?Family History  ?Problem Relation Age of Onset  ? Breast cancer Mother   ? Diabetes Father   ? Heart disease Father   ? Heart disease Brother   ? Heart disease Brother   ? Prostate cancer Neg Hx   ? Bladder Cancer Neg Hx   ? Kidney cancer Neg Hx   ? ? ?Social History  ? ?Socioeconomic History  ? Marital status: Married  ?  Spouse name: Not on file  ? Number of children: Not on file  ? Years of education: Not on file  ? Highest education level: Not on file  ?Occupational History  ? Not on file  ?Tobacco Use  ? Smoking status: Former  ?  Types: Cigarettes  ?  Quit date: 1963  ?  Years since quitting: 60.2  ? Smokeless tobacco: Never  ?Vaping Use  ? Vaping Use: Never used  ?Substance and Sexual Activity  ? Alcohol use: No  ?  Alcohol/week: 0.0 standard drinks  ? Drug use: No  ? Sexual activity: Yes  ?Other Topics Concern  ? Not on file  ?Social History Narrative  ? Not on file  ? ?Social Determinants of Health  ? ?Financial Resource Strain: Not on file  ?Food Insecurity: Not on file  ?Transportation Needs: Not on file  ?Physical Activity: Not on file  ?Stress: Not on file  ?Social Connections: Not on file  ?Intimate Partner Violence: Not on file  ? ? ?Review of Systems: ?See HPI, otherwise negative ROS ? ?Physical Exam: ?Ht '5\' 10"'$  (1.778 m)   Wt 79.4 kg   BMI 25.11 kg/m?  ?General:   Alert,  pleasant and cooperative in NAD ?Head:  Normocephalic and atraumatic. ?Lungs:  Clear to auscultation.    ?Heart:  Regular rate and rhythm.  ? ?Impression/Plan: ?Levi Figueroa is here for ophthalmic surgery. ? ?Risks, benefits, limitations, and alternatives regarding ophthalmic surgery have been reviewed with the patient.  Questions have been answered.  All parties agreeable. ? ? Leandrew Koyanagi, MD  02/22/2022, 8:27 AM ? ?

## 2022-02-23 ENCOUNTER — Other Ambulatory Visit: Payer: Self-pay

## 2022-02-23 ENCOUNTER — Encounter: Payer: Self-pay | Admitting: Ophthalmology

## 2022-02-28 DIAGNOSIS — H2511 Age-related nuclear cataract, right eye: Secondary | ICD-10-CM | POA: Diagnosis not present

## 2022-03-06 NOTE — Discharge Instructions (Signed)

## 2022-03-08 ENCOUNTER — Ambulatory Visit: Payer: PPO | Admitting: Anesthesiology

## 2022-03-08 ENCOUNTER — Encounter
Admission: RE | Disposition: A | Discharge status: Home / Self Care | Payer: Self-pay | Source: Home / Self Care | Attending: Ophthalmology

## 2022-03-08 ENCOUNTER — Other Ambulatory Visit: Payer: Self-pay

## 2022-03-08 ENCOUNTER — Encounter: Payer: Self-pay | Admitting: Ophthalmology

## 2022-03-08 ENCOUNTER — Ambulatory Visit
Admission: RE | Admit: 2022-03-08 | Discharge: 2022-03-08 | Disposition: A | Discharge status: Home / Self Care | Payer: PPO | Attending: Ophthalmology | Admitting: Ophthalmology

## 2022-03-08 DIAGNOSIS — H2512 Age-related nuclear cataract, left eye: Secondary | ICD-10-CM | POA: Diagnosis not present

## 2022-03-08 DIAGNOSIS — D693 Immune thrombocytopenic purpura: Secondary | ICD-10-CM | POA: Diagnosis not present

## 2022-03-08 DIAGNOSIS — I252 Old myocardial infarction: Secondary | ICD-10-CM | POA: Diagnosis not present

## 2022-03-08 DIAGNOSIS — E785 Hyperlipidemia, unspecified: Secondary | ICD-10-CM | POA: Insufficient documentation

## 2022-03-08 DIAGNOSIS — Z7982 Long term (current) use of aspirin: Secondary | ICD-10-CM | POA: Diagnosis not present

## 2022-03-08 DIAGNOSIS — Z87891 Personal history of nicotine dependence: Secondary | ICD-10-CM | POA: Insufficient documentation

## 2022-03-08 DIAGNOSIS — K219 Gastro-esophageal reflux disease without esophagitis: Secondary | ICD-10-CM | POA: Diagnosis not present

## 2022-03-08 DIAGNOSIS — I2510 Atherosclerotic heart disease of native coronary artery without angina pectoris: Secondary | ICD-10-CM | POA: Insufficient documentation

## 2022-03-08 DIAGNOSIS — Z7902 Long term (current) use of antithrombotics/antiplatelets: Secondary | ICD-10-CM | POA: Insufficient documentation

## 2022-03-08 DIAGNOSIS — Z951 Presence of aortocoronary bypass graft: Secondary | ICD-10-CM | POA: Insufficient documentation

## 2022-03-08 DIAGNOSIS — H25812 Combined forms of age-related cataract, left eye: Secondary | ICD-10-CM | POA: Diagnosis not present

## 2022-03-08 DIAGNOSIS — I110 Hypertensive heart disease with heart failure: Secondary | ICD-10-CM | POA: Diagnosis not present

## 2022-03-08 DIAGNOSIS — I509 Heart failure, unspecified: Secondary | ICD-10-CM | POA: Insufficient documentation

## 2022-03-08 DIAGNOSIS — H2511 Age-related nuclear cataract, right eye: Secondary | ICD-10-CM | POA: Diagnosis not present

## 2022-03-08 HISTORY — PX: CATARACT EXTRACTION W/PHACO: SHX586

## 2022-03-08 SURGERY — PHACOEMULSIFICATION, CATARACT, WITH IOL INSERTION
Anesthesia: Monitor Anesthesia Care | Site: Eye | Laterality: Left

## 2022-03-08 MED ORDER — ACETAMINOPHEN 325 MG PO TABS: 325.0000 mg | ORAL_TABLET | Freq: Once | ORAL | Status: DC

## 2022-03-08 MED ORDER — SIGHTPATH DOSE#1 NA HYALUR & NA CHOND-NA HYALUR IO KIT
PACK | INTRAOCULAR | Status: DC | PRN
  Administered 2022-03-08: 1 via OPHTHALMIC

## 2022-03-08 MED ORDER — LACTATED RINGERS IV SOLN: INTRAVENOUS | Status: DC

## 2022-03-08 MED ORDER — ACETAMINOPHEN 160 MG/5ML PO SOLN: 325.0000 mg | Freq: Once | ORAL | Status: DC

## 2022-03-08 MED ORDER — LIDOCAINE HCL (PF) 2 % IJ SOLN
INTRAMUSCULAR | Status: DC | PRN
  Administered 2022-03-08: 1 mL via INTRAMUSCULAR

## 2022-03-08 MED ORDER — MOXIFLOXACIN HCL 0.5 % OP SOLN
OPHTHALMIC | Status: DC | PRN
  Administered 2022-03-08: 0.2 mL via OPHTHALMIC

## 2022-03-08 MED ORDER — SIGHTPATH DOSE#1 BSS IO SOLN
INTRAOCULAR | Status: DC | PRN
  Administered 2022-03-08: 86 mL via OPHTHALMIC

## 2022-03-08 MED ORDER — BRIMONIDINE TARTRATE-TIMOLOL 0.2-0.5 % OP SOLN
OPHTHALMIC | Status: DC | PRN
  Administered 2022-03-08: 1 [drp] via OPHTHALMIC

## 2022-03-08 MED ORDER — TETRACAINE HCL 0.5 % OP SOLN
1.0000 [drp] | OPHTHALMIC | Status: DC | PRN
  Administered 2022-03-08 (×3): 1 [drp] via OPHTHALMIC

## 2022-03-08 MED ORDER — MIDAZOLAM HCL 2 MG/2ML IJ SOLN
INTRAMUSCULAR | Status: DC | PRN
  Administered 2022-03-08: 1 mg via INTRAVENOUS

## 2022-03-08 MED ORDER — ARMC OPHTHALMIC DILATING DROPS
1.0000 "application " | OPHTHALMIC | Status: DC | PRN
  Administered 2022-03-08 (×3): 1 via OPHTHALMIC

## 2022-03-08 MED ORDER — SIGHTPATH DOSE#1 BSS IO SOLN
INTRAOCULAR | Status: DC | PRN
Start: 2022-03-08 — End: 2022-03-08
  Administered 2022-03-08: 15 mL

## 2022-03-08 MED ORDER — FENTANYL CITRATE (PF) 100 MCG/2ML IJ SOLN
INTRAMUSCULAR | Status: DC | PRN
  Administered 2022-03-08: 50 ug via INTRAVENOUS

## 2022-03-08 SURGICAL SUPPLY — 11 items
CATARACT SUITE SIGHTPATH (MISCELLANEOUS) ×2 IMPLANT
FEE CATARACT SUITE SIGHTPATH (MISCELLANEOUS) ×1 IMPLANT
GLOVE SRG 8 PF TXTR STRL LF DI (GLOVE) ×1 IMPLANT
GLOVE SURG ENC TEXT LTX SZ7.5 (GLOVE) ×2 IMPLANT
GLOVE SURG UNDER POLY LF SZ8 (GLOVE) ×2
LENS IOL TECNIS EYHANCE 22.5 (Intraocular Lens) ×1 IMPLANT
NDL FILTER BLUNT 18X1 1/2 (NEEDLE) ×1 IMPLANT
NEEDLE FILTER BLUNT 18X 1/2SAF (NEEDLE) ×1
NEEDLE FILTER BLUNT 18X1 1/2 (NEEDLE) ×1 IMPLANT
SYR 3ML LL SCALE MARK (SYRINGE) ×2 IMPLANT
WATER STERILE IRR 250ML POUR (IV SOLUTION) ×2 IMPLANT

## 2022-03-08 NOTE — Anesthesia Postprocedure Evaluation (Signed)
Anesthesia Post Note ? ?Patient: [name] ? ?Procedure(s) Performed: CATARACT EXTRACTION PHACO AND INTRAOCULAR LENS PLACEMENT (IOC)LEFT MiLOOP 4.40 01:01.5 (Left: Eye) ? ? ?  ?Patient location during evaluation: PACU ?Anesthesia Type: MAC ?Level of consciousness: awake and alert and oriented ?Pain management: satisfactory to patient ?Vital Signs Assessment: post-procedure vital signs reviewed and stable ?Respiratory status: spontaneous breathing, nonlabored ventilation and respiratory function stable ?Cardiovascular status: blood pressure returned to baseline and stable ?Postop Assessment: Adequate PO intake and No signs of nausea or vomiting ?Anesthetic complications: no ? ? ?No notable events documented. ? ?Raliegh Ip ? ? ? ? ? ?

## 2022-03-08 NOTE — Transfer of Care (Signed)
Immediate Anesthesia Transfer of Care Note ? ?Patient: Levi Figueroa ? ?Procedure(s) Performed: CATARACT EXTRACTION PHACO AND INTRAOCULAR LENS PLACEMENT (IOC)LEFT MiLOOP 4.40 01:01.5 (Left: Eye) ? ?Patient Location: PACU ? ?Anesthesia Type: MAC ? ?Level of Consciousness: awake, alert  and patient cooperative ? ?Airway and Oxygen Therapy: Patient Spontanous Breathing and Patient connected to supplemental oxygen ? ?Post-op Assessment: Post-op Vital signs reviewed, Patient's Cardiovascular Status Stable, Respiratory Function Stable, Patent Airway and No signs of Nausea or vomiting ? ?Post-op Vital Signs: Reviewed and stable ? ?Complications: No notable events documented. ? ?

## 2022-03-08 NOTE — Op Note (Signed)
?  OPERATIVE NOTE ? ?Levi Figueroa ?[id number] ?03/08/2022 ? ? ?PREOPERATIVE DIAGNOSIS:  Nuclear sclerotic cataract left eye. H25.12 ?  ?POSTOPERATIVE DIAGNOSIS:    Nuclear sclerotic cataract left eye.   ?  ?PROCEDURE:  Phacoemusification with posterior chamber intraocular lens placement of the left eye  ?Ultrasound time: Procedure(s): ?CATARACT EXTRACTION PHACO AND INTRAOCULAR LENS PLACEMENT (IOC)LEFT MiLOOP 4.40 01:01.5 (Left) ? ?LENS:   ?Implant Name Type Inv. Item Serial No. Manufacturer Lot No. LRB No. Used Action  ?LENS IOL TECNIS EYHANCE 22.5 - [id number] Intraocular Lens LENS IOL TECNIS EYHANCE 22.5 [id number] [employer]  Left 1 Implanted  ?   ? ?SURGEON:  Wyonia Hough, MD ?  ?ANESTHESIA:  Topical with tetracaine drops and 2% Xylocaine jelly, augmented with 1% preservative-free intracameral lidocaine. ? ?  ?COMPLICATIONS:  None. ?  ?DESCRIPTION OF PROCEDURE:  The patient was identified in the holding room and transported to the operating room and placed in the supine position under the operating microscope.  The left eye was identified as the operative eye and it was prepped and draped in the usual sterile ophthalmic fashion. ?  ?A 1 millimeter clear-corneal paracentesis was made at the 1:30 position.  0.5 ml of preservative-free 1% lidocaine was injected into the anterior chamber. ? The anterior chamber was filled with Viscoat viscoelastic.  A 2.4 millimeter keratome was used to make a near-clear corneal incision at the 10:30 position.  .  A curvilinear capsulorrhexis was made with a cystotome and capsulorrhexis forceps.  Balanced salt solution was used to hydrodissect and hydrodelineate the nucleus. A MiLoop device was used to prechop the lens nucleus. ?  ?Phacoemulsification was then used in stop and chop fashion to remove the lens nucleus and epinucleus.  The remaining cortex was then removed using the irrigation and aspiration handpiece. Provisc was then placed into the capsular bag to distend  it for lens placement.  A lens was then injected into the capsular bag.  The remaining viscoelastic was aspirated. ?  ?Wounds were hydrated with balanced salt solution.  The anterior chamber was inflated to a physiologic pressure with balanced salt solution.  No wound leaks were noted. Vigamox 0.2 ml of a '1mg'$  per ml solution was injected into the anterior chamber for a dose of 0.2 mg of intracameral antibiotic at the completion of the case. ?  Timolol and Brimonidine drops were applied to the eye.  The patient was taken to the recovery room in stable condition without complications of anesthesia or surgery. ? ?Levi Figueroa ?03/08/2022, 12:53 PM ? ?

## 2022-03-08 NOTE — Anesthesia Preprocedure Evaluation (Signed)
Anesthesia Evaluation  ?Patient identified by MRN, date of birth, ID band ?Patient awake ? ? ? ?Reviewed: ?Allergy & Precautions, NPO status , Patient's Chart, lab work & pertinent test results, reviewed documented beta blocker date and time  ? ?History of Anesthesia Complications ?(+) PONV and history of anesthetic complications ? ?Airway ?Mallampati: III ? ?TM Distance: <3 FB ?Neck ROM: Limited ? ? ? Dental ? ?(+)  ?  ?Pulmonary ?former smoker,  ?  ?breath sounds clear to auscultation ? ? ? ? ? ? Cardiovascular ?hypertension, (-) angina+ CAD, + Past MI (2004), + CABG (2004) and +CHF  ?(-) DOE + dysrhythmias Atrial Fibrillation  ?Rhythm:Regular Rate:Normal ? ? ?HLD ?  ?Neuro/Psych ?  ? GI/Hepatic ?GERD  ,  ?Endo/Other  ? ? Renal/GU ?Renal disease ([place])  ? ?  ?Musculoskeletal ? ? Abdominal ?  ?Peds ? Hematology ? ?(+) Blood dyscrasia, anemia ,  ?MGUS ?ITP on ASA & plavix   ?Anesthesia Other Findings ?Gout ?Melanoma ? Reproductive/Obstetrics ? ?  ? ? ? ? ? ? ? ? ? ? ? ? ? ?  ?  ? ? ? ? ? ? ? ? ?Anesthesia Physical ? ?Anesthesia Plan ? ?ASA: 3 ? ?Anesthesia Plan: MAC  ? ?Post-op Pain Management: Minimal or no pain anticipated  ? ?Induction: Intravenous ? ?PONV Risk Score and Plan: 2 and TIVA, Midazolam, Treatment may vary due to age or medical condition and Ondansetron ? ?Airway Management Planned: Nasal Cannula ? ?Additional Equipment:  ? ?Intra-op Plan:  ? ?Post-operative Plan:  ? ?Informed Consent: I have reviewed the patients History and Physical, chart, labs and discussed the procedure including the risks, benefits and alternatives for the proposed anesthesia with the patient or authorized representative who has indicated his/her understanding and acceptance.  ? ? ? ? ? ?Plan Discussed with: CRNA and Anesthesiologist ? ?Anesthesia Plan Comments:   ? ? ? ? ? ? ?Anesthesia Quick Evaluation ? ?

## 2022-03-08 NOTE — H&P (Signed)
?[facility]  ? ?Primary Care Physician:  [name], MD ?Ophthalmologist: Dr. Leandrew Koyanagi ? ?Pre-Procedure History & Physical: ?HPI:  Levi Figueroa is a 87 y.o. male here for ophthalmic surgery. ?  ?Past Medical History:  ?Diagnosis Date  ? CAD (coronary artery disease)   ? Cancer [facility])   ? melanoma  ? CHF (congestive heart failure) ([place])   ? 2017 with pneumonia  ? Colon polyps   ? GERD (gastroesophageal reflux disease)   ? Gout   ? Hyperlipidemia   ? Hypertension   ? IDA (iron deficiency anemia)   ? Kidney stones   ? MI (myocardial infarction) ([place]) 2004  ? Mild anemia   ? PONV (postoperative nausea and vomiting)   ? Thrombocytopenia ([place])   ? followed by oncology and PCP  ? Wears hearing aid in both ears   ? ? ?Past Surgical History:  ?Procedure Laterality Date  ? BLADDER SURGERY    ? CARDIAC CATHETERIZATION    ? CATARACT EXTRACTION W/PHACO Right 02/22/2022  ? Procedure: CATARACT EXTRACTION PHACO AND INTRAOCULAR LENS PLACEMENT (IOC) RIGHT 11.87 01:17.3;  Surgeon: Leandrew Koyanagi, MD;  Location: [facility];  Service: Ophthalmology;  Laterality: Right;  ? CORONARY ARTERY BYPASS GRAFT  2004  ? 5 vessel  ? CYSTOSCOPY WITH LITHOLAPAXY N/A 07/05/2018  ? Procedure: CYSTOSCOPY WITH LITHOLAPAXY;  Surgeon: [name], MD;  Location: [facility];  Service: Urology;  Laterality: N/A;  ? HOLMIUM LASER APPLICATION N/A 35/32/9924  ? Procedure: HOLMIUM LASER APPLICATION;  Surgeon: [name], MD;  Location: [facility];  Service: Urology;  Laterality: N/A;  ? INGUINAL HERNIA REPAIR Right   ? LASER ABLATION    ? x 2 prostatic hypertrophy  ? TONSILLECTOMY    ? ? ?Prior to Admission medications   ?Medication Sig Start Date End Date Taking? Authorizing Provider  ?acetaminophen (TYLENOL) 500 MG tablet Take 500 mg by mouth every 6 (six) hours as needed.   Yes [provider]  ?aspirin EC 81 MG tablet Take 81 mg by mouth every evening.   Yes [provider]  ?atorvastatin  (LIPITOR) 10 MG tablet Take 5 mg by mouth daily at 2 PM.    Yes [provider]  ?azelastine (ASTELIN) 0.1 % nasal spray Place into the nose. 12/06/21  Yes [provider]  ?CARTIA XT 240 MG 24 hr capsule Take 240 mg by mouth daily. 05/25/18  Yes [provider]  ?cetirizine (ZYRTEC) 10 MG tablet Take 10 mg by mouth daily.   Yes [provider]  ?Cholecalciferol (VITAMIN D-3 PO) Take by mouth.   Yes [provider]  ?diltiazem (TIAZAC) 240 MG 24 hr capsule Take by mouth. 11/09/21  Yes [provider]  ?doxycycline (VIBRAMYCIN) 100 MG capsule Take 100 mg by mouth 2 (two) times daily. 12/13/21  Yes [provider]  ?lisinopril (ZESTRIL) 10 MG tablet Take 10 mg by mouth daily. 11/09/21  Yes [provider]  ?metoprolol tartrate (LOPRESSOR) 50 MG tablet Take 1 tablet by mouth in the morning and at bedtime. 10/02/19 03/08/22 Yes [provider]  ?Multiple Vitamins-Minerals (MULTIVITAMIN WITH MINERALS) tablet Take 1 tablet by mouth daily.   Yes [provider]  ?Multiple Vitamins-Minerals (PRESERVISION AREDS 2 PO) Take 1 tablet by mouth 2 (two) times daily.   Yes [provider]  ?Omega-3 Fatty Acids (FISH OIL) 1200 MG CAPS Take 1,200 mg by mouth daily.   Yes [provider]  ?omeprazole (New Era)  20 MG capsule Take 20 mg by mouth daily.   Yes [provider]  ?vitamin B-12 (CYANOCOBALAMIN) 1000 MCG tablet Take 1,000 mcg by mouth daily.   Yes [provider]  ?albuterol (VENTOLIN HFA) 108 (90 Base) MCG/ACT inhaler SMARTSIG:2 Puff(s) By Mouth Every 6 Hours PRN 12/13/21   [provider]  ?loratadine (CLARITIN) 10 MG tablet Take 10 mg by mouth daily. ?Patient not taking: Reported on 02/15/2022    [provider]  ?magnesium oxide (MAG-OX) 400 MG tablet magnesium oxide 400 mg (241.3 mg magnesium) tablet ? TAKE 1 TABLET BY MOUTH ONCE DAILY 11/08/20   [provider]  ?psyllium  (METAMUCIL) 58.6 % powder Take 1 packet by mouth at bedtime.    [provider]  ?triamcinolone (KENALOG) 0.1 % triamcinolone acetonide 0.1 % topical cream ? APPLY CREAM EXTERNALLY TWICE DAILY TO AFFECTED AREA AS NEEDED    [provider]  ? ? ?Allergies as of 01/16/2022 - Review Complete 12/20/2021  ?Allergen Reaction Noted  ? Penicillins Swelling 12/29/2015  ? ? ?Family History  ?Problem Relation Age of Onset  ? Breast cancer Mother   ? Diabetes Father   ? Heart disease Father   ? Heart disease Brother   ? Heart disease Brother   ? Prostate cancer Neg Hx   ? Bladder Cancer Neg Hx   ? Kidney cancer Neg Hx   ? ? ?Social History  ? ?Socioeconomic History  ? Marital status: Married  ?  Spouse name: Not on file  ? Number of children: Not on file  ? Years of education: Not on file  ? Highest education level: Not on file  ?Occupational History  ? Not on file  ?Tobacco Use  ? Smoking status: Former  ?  Types: Cigarettes  ?  Quit date: 1963  ?  Years since quitting: 60.3  ? Smokeless tobacco: Never  ?Vaping Use  ? Vaping Use: Never used  ?Substance and Sexual Activity  ? Alcohol use: No  ?  Alcohol/week: 0.0 standard drinks  ? Drug use: No  ? Sexual activity: Yes  ?Other Topics Concern  ? Not on file  ?Social History Narrative  ? Not on file  ? ?Social Determinants of Health  ? ?Financial Resource Strain: Not on file  ?Food Insecurity: Not on file  ?Transportation Needs: Not on file  ?Physical Activity: Not on file  ?Stress: Not on file  ?Social Connections: Not on file  ?Intimate Partner Violence: Not on file  ? ? ?Review of Systems: ?See HPI, otherwise negative ROS ? ?Physical Exam: ?BP (!) 160/90   Pulse 61   Temp (!) 97 ?F (36.1 ?C) (Temporal)   Resp 19   Ht '5\' 10"'$  (1.778 m)   Wt 80.6 kg   SpO2 97%   BMI 25.51 kg/m?  ?General:   Alert,  pleasant and cooperative in NAD ?Head:  Normocephalic and atraumatic. ?Lungs:  Clear to auscultation.    ?Heart:  Regular rate and rhythm.   ? ?Impression/Plan: ?Levi Figueroa is here for ophthalmic surgery. ? ?Risks, benefits, limitations, and alternatives regarding ophthalmic surgery have been reviewed with the patient.  Questions have been answered.  All parties agreeable. ? ? Leandrew Koyanagi, MD  03/08/2022, 11:28 AM ? ?

## 2022-03-09 ENCOUNTER — Encounter: Payer: Self-pay | Admitting: Ophthalmology

## 2022-03-21 ENCOUNTER — Ambulatory Visit: Payer: PPO | Admitting: Podiatry

## 2022-03-21 DIAGNOSIS — M79675 Pain in left toe(s): Secondary | ICD-10-CM | POA: Diagnosis not present

## 2022-03-21 DIAGNOSIS — M79674 Pain in right toe(s): Secondary | ICD-10-CM

## 2022-03-21 DIAGNOSIS — B351 Tinea unguium: Secondary | ICD-10-CM

## 2022-03-21 NOTE — Progress Notes (Signed)
? ?  SUBJECTIVE Patient presents to office today complaining of elongated, thickened nails that cause pain while ambulating in shoes.  Patient is unable to trim their own nails. Patient is here for further evaluation and treatment.  Past Medical History:  Diagnosis Date   CAD (coronary artery disease)    Cancer ([facility])    melanoma   CHF (congestive heart failure) ([facility])    2017 with pneumonia   Colon polyps    GERD (gastroesophageal reflux disease)    Gout    Hyperlipidemia    Hypertension    IDA (iron deficiency anemia)    Kidney stones    MI (myocardial infarction) ([facility]) 2004   Mild anemia    PONV (postoperative nausea and vomiting)    Thrombocytopenia ([facility])    followed by oncology and PCP   Wears hearing aid in both ears     OBJECTIVE General Patient is awake, alert, and oriented x 3 and in no acute distress. Derm Skin is dry and supple bilateral. Negative open lesions or macerations. Remaining integument unremarkable. Nails are tender, long, thickened and dystrophic with subungual debris, consistent with onychomycosis, 1-5 bilateral. No signs of infection noted. Vasc  DP and PT pedal pulses palpable bilaterally. Temperature gradient within normal limits.  Neuro Epicritic and protective threshold sensation grossly intact bilaterally.  Musculoskeletal Exam No symptomatic pedal deformities noted bilateral. Muscular strength within normal limits.  ASSESSMENT 1.  Pain due to onychomycosis of toenails both  PLAN OF CARE 1. Patient evaluated today.  2. Instructed to maintain good pedal hygiene and foot care.  3. Mechanical debridement of nails 1-5 bilaterally performed using a nail nipper. Filed with dremel without incident.  4. Return to clinic in 3 mos.    Jaxsun Ciampi M. Voncille Simm, DPM [facility]  Dr. Saya Mccoll M. Lochlan Grygiel, DPM    [address]                                     [place], [place] [address]                Office [phone]  Fax [phone]    

## 2022-03-27 DIAGNOSIS — Z85828 Personal history of other malignant neoplasm of skin: Secondary | ICD-10-CM | POA: Diagnosis not present

## 2022-03-27 DIAGNOSIS — Z85820 Personal history of malignant melanoma of skin: Secondary | ICD-10-CM | POA: Diagnosis not present

## 2022-03-27 DIAGNOSIS — L570 Actinic keratosis: Secondary | ICD-10-CM | POA: Diagnosis not present

## 2022-03-27 DIAGNOSIS — D2271 Melanocytic nevi of right lower limb, including hip: Secondary | ICD-10-CM | POA: Diagnosis not present

## 2022-03-27 DIAGNOSIS — X32XXXA Exposure to sunlight, initial encounter: Secondary | ICD-10-CM | POA: Diagnosis not present

## 2022-03-27 DIAGNOSIS — D2261 Melanocytic nevi of right upper limb, including shoulder: Secondary | ICD-10-CM | POA: Diagnosis not present

## 2022-03-31 DIAGNOSIS — Z961 Presence of intraocular lens: Secondary | ICD-10-CM | POA: Diagnosis not present

## 2022-04-05 DIAGNOSIS — E7849 Other hyperlipidemia: Secondary | ICD-10-CM | POA: Diagnosis not present

## 2022-04-05 DIAGNOSIS — M1000 Idiopathic gout, unspecified site: Secondary | ICD-10-CM | POA: Diagnosis not present

## 2022-04-05 DIAGNOSIS — D693 Immune thrombocytopenic purpura: Secondary | ICD-10-CM | POA: Diagnosis not present

## 2022-04-05 DIAGNOSIS — I10 Essential (primary) hypertension: Secondary | ICD-10-CM | POA: Diagnosis not present

## 2022-04-05 DIAGNOSIS — I700 Atherosclerosis of aorta: Secondary | ICD-10-CM | POA: Diagnosis not present

## 2022-04-12 DIAGNOSIS — I5022 Chronic systolic (congestive) heart failure: Secondary | ICD-10-CM | POA: Diagnosis not present

## 2022-04-12 DIAGNOSIS — I2510 Atherosclerotic heart disease of native coronary artery without angina pectoris: Secondary | ICD-10-CM | POA: Diagnosis not present

## 2022-04-12 DIAGNOSIS — I10 Essential (primary) hypertension: Secondary | ICD-10-CM | POA: Diagnosis not present

## 2022-04-12 DIAGNOSIS — I480 Paroxysmal atrial fibrillation: Secondary | ICD-10-CM | POA: Diagnosis not present

## 2022-04-12 DIAGNOSIS — D692 Other nonthrombocytopenic purpura: Secondary | ICD-10-CM | POA: Diagnosis not present

## 2022-04-12 DIAGNOSIS — E7849 Other hyperlipidemia: Secondary | ICD-10-CM | POA: Diagnosis not present

## 2022-04-12 DIAGNOSIS — I700 Atherosclerosis of aorta: Secondary | ICD-10-CM | POA: Diagnosis not present

## 2022-04-12 DIAGNOSIS — M1000 Idiopathic gout, unspecified site: Secondary | ICD-10-CM | POA: Diagnosis not present

## 2022-04-12 DIAGNOSIS — Z0000 Encounter for general adult medical examination without abnormal findings: Secondary | ICD-10-CM | POA: Diagnosis not present

## 2022-04-12 DIAGNOSIS — D693 Immune thrombocytopenic purpura: Secondary | ICD-10-CM | POA: Diagnosis not present

## 2022-04-17 DIAGNOSIS — J40 Bronchitis, not specified as acute or chronic: Secondary | ICD-10-CM | POA: Diagnosis not present

## 2022-04-17 DIAGNOSIS — Z03818 Encounter for observation for suspected exposure to other biological agents ruled out: Secondary | ICD-10-CM | POA: Diagnosis not present

## 2022-05-09 ENCOUNTER — Other Ambulatory Visit: Payer: Self-pay

## 2022-05-09 ENCOUNTER — Inpatient Hospital Stay: Payer: PPO | Attending: Internal Medicine

## 2022-05-09 DIAGNOSIS — D693 Immune thrombocytopenic purpura: Secondary | ICD-10-CM | POA: Insufficient documentation

## 2022-05-09 LAB — BASIC METABOLIC PANEL
Anion gap: 7 (ref 5–15)
BUN: 20 mg/dL (ref 8–23)
CO2: 26 mmol/L (ref 22–32)
Calcium: 8.8 mg/dL — ABNORMAL LOW (ref 8.9–10.3)
Chloride: 110 mmol/L (ref 98–111)
Creatinine, Ser: 1.05 mg/dL (ref 0.61–1.24)
GFR, Estimated: >60 mL/min (ref >60)
Glucose, Bld: 113 mg/dL — ABNORMAL HIGH (ref 70–99)
Potassium: 4 mmol/L (ref 3.5–5.1)
Sodium: 143 mmol/L (ref 135–145)

## 2022-05-09 LAB — CBC WITH DIFFERENTIAL/PLATELET
Abs Immature Granulocytes: 0.09 10*3/uL — ABNORMAL HIGH (ref 0.00–0.07)
Basophils Absolute: 0.1 10*3/uL (ref 0.0–0.1)
Basophils Relative: 0 %
Eosinophils Absolute: 0.4 10*3/uL (ref 0.0–0.5)
Eosinophils Relative: 3 %
HCT: 42.2 % (ref 39.0–52.0)
Hemoglobin: 13.4 g/dL (ref 13.0–17.0)
Immature Granulocytes: 1 %
Lymphocytes Relative: 5 %
Lymphs Abs: 0.7 10*3/uL (ref 0.7–4.0)
MCH: 27.8 pg (ref 26.0–34.0)
MCHC: 31.8 g/dL (ref 30.0–36.0)
MCV: 87.6 fL (ref 80.0–100.0)
Monocytes Absolute: 0.9 10*3/uL (ref 0.1–1.0)
Monocytes Relative: 7 %
Neutro Abs: 10.7 10*3/uL — ABNORMAL HIGH (ref 1.7–7.7)
Neutrophils Relative %: 84 %
Platelets: 69 10*3/uL — ABNORMAL LOW (ref 150–400)
RBC: 4.82 MIL/uL (ref 4.22–5.81)
RDW: 15.1 % (ref 11.5–15.5)
WBC: 12.8 10*3/uL — ABNORMAL HIGH (ref 4.0–10.5)
nRBC: 0 % (ref 0.0–0.2)

## 2022-05-15 ENCOUNTER — Telehealth: Payer: Self-pay

## 2022-05-15 NOTE — Telephone Encounter (Signed)
-----   Message from Cammie Sickle, MD sent at 05/13/2022  7:22 AM EDT ----- Please inform pt that his plate let's are stable.  Continue follow up as planned.  GB

## 2022-05-15 NOTE — Telephone Encounter (Unsigned)
Tried to call pt at home and cell #, no answer or vm. Will try later.

## 2022-05-16 NOTE — Telephone Encounter (Signed)
Pt.notified

## 2022-06-20 ENCOUNTER — Ambulatory Visit: Payer: PPO | Admitting: Podiatry

## 2022-06-20 DIAGNOSIS — B351 Tinea unguium: Secondary | ICD-10-CM

## 2022-06-20 DIAGNOSIS — M79674 Pain in right toe(s): Secondary | ICD-10-CM

## 2022-06-20 DIAGNOSIS — M778 Other enthesopathies, not elsewhere classified: Secondary | ICD-10-CM | POA: Diagnosis not present

## 2022-06-20 DIAGNOSIS — M79675 Pain in left toe(s): Secondary | ICD-10-CM | POA: Diagnosis not present

## 2022-06-20 MED ORDER — BETAMETHASONE SOD PHOS & ACET 6 (3-3) MG/ML IJ SUSP
3.0000 mg | Freq: Once | INTRAMUSCULAR | Status: AC
  Administered 2022-06-20: 3 mg via INTRA_ARTICULAR

## 2022-06-20 NOTE — Progress Notes (Signed)
   SUBJECTIVE Patient presents to office today complaining of elongated, thickened nails that cause pain while ambulating in shoes.  Patient is unable to trim their own nails.   Patient also states that for a few years now he has developed pain and tenderness to the lateral aspect of the left foot.  He denies a history of injury.  Gradual onset.  He says that it hurts with the first few steps in the morning getting out of bed.  He has not done anything for treatment.  He presents for further treatment and evaluation patient is here for further evaluation and treatment.  Past Medical History:  Diagnosis Date   CAD (coronary artery disease)    Cancer ([place])    melanoma   CHF (congestive heart failure) ([place])    2017 with pneumonia   Colon polyps    GERD (gastroesophageal reflux disease)    Gout    Hyperlipidemia    Hypertension    IDA (iron deficiency anemia)    Kidney stones    MI (myocardial infarction) ([place]) 2004   Mild anemia    PONV (postoperative nausea and vomiting)    Thrombocytopenia ([place])    followed by oncology and PCP   Wears hearing aid in both ears     OBJECTIVE General Patient is awake, alert, and oriented x 3 and in no acute distress. Derm Skin is dry and supple bilateral. Negative open lesions or macerations. Remaining integument unremarkable. Nails are tender, long, thickened and dystrophic with subungual debris, consistent with onychomycosis, 1-5 bilateral. No signs of infection noted. Vasc  DP and PT pedal pulses palpable bilaterally. Temperature gradient within normal limits.  Neuro Epicritic and protective threshold sensation grossly intact bilaterally.  Musculoskeletal Exam No symptomatic pedal deformities noted bilateral.  Pain on palpation noted to the lateral aspect of the midtarsal joint left foot  ASSESSMENT 1.  Pain due to onychomycosis of toenails both 2.  Capsulitis left midtarsal joint lateral aspect  PLAN OF CARE 1. Patient evaluated today.  2.  Instructed to maintain good pedal hygiene and foot care.  3. Mechanical debridement of nails 1-5 bilaterally performed using a nail nipper. Filed with dremel without incident.  4.  Injection of 0.5 cc Celestone Soluspan injected into the lateral aspect of the midtarsal joint left foot  5.  Return to clinic in 3 mos.    Edrick Kins, DPM [facility]  Dr. Edrick Kins, DPM    [address], [place] [address]                Office [phone]  Fax [phone]

## 2022-07-13 DIAGNOSIS — S82015A Nondisplaced osteochondral fracture of left patella, initial encounter for closed fracture: Secondary | ICD-10-CM | POA: Diagnosis not present

## 2022-08-04 ENCOUNTER — Other Ambulatory Visit: Payer: Self-pay

## 2022-08-04 DIAGNOSIS — D693 Immune thrombocytopenic purpura: Secondary | ICD-10-CM

## 2022-08-07 ENCOUNTER — Inpatient Hospital Stay: Payer: PPO | Attending: Internal Medicine

## 2022-08-07 ENCOUNTER — Inpatient Hospital Stay: Payer: PPO | Admitting: Internal Medicine

## 2022-08-07 ENCOUNTER — Encounter: Payer: Self-pay | Admitting: Internal Medicine

## 2022-08-07 DIAGNOSIS — I110 Hypertensive heart disease with heart failure: Secondary | ICD-10-CM | POA: Insufficient documentation

## 2022-08-07 DIAGNOSIS — D693 Immune thrombocytopenic purpura: Secondary | ICD-10-CM

## 2022-08-07 DIAGNOSIS — Z85820 Personal history of malignant melanoma of skin: Secondary | ICD-10-CM | POA: Diagnosis not present

## 2022-08-07 DIAGNOSIS — I509 Heart failure, unspecified: Secondary | ICD-10-CM | POA: Insufficient documentation

## 2022-08-07 DIAGNOSIS — Z87891 Personal history of nicotine dependence: Secondary | ICD-10-CM | POA: Diagnosis not present

## 2022-08-07 DIAGNOSIS — Z803 Family history of malignant neoplasm of breast: Secondary | ICD-10-CM | POA: Insufficient documentation

## 2022-08-07 DIAGNOSIS — I2510 Atherosclerotic heart disease of native coronary artery without angina pectoris: Secondary | ICD-10-CM | POA: Diagnosis not present

## 2022-08-07 LAB — CBC WITH DIFFERENTIAL/PLATELET
Abs Immature Granulocytes: 0.1 10*3/uL — ABNORMAL HIGH (ref 0.00–0.07)
Basophils Absolute: 0 10*3/uL (ref 0.0–0.1)
Basophils Relative: 0 %
Eosinophils Absolute: 0.3 10*3/uL (ref 0.0–0.5)
Eosinophils Relative: 3 %
HCT: 38.8 % — ABNORMAL LOW (ref 39.0–52.0)
Hemoglobin: 12.6 g/dL — ABNORMAL LOW (ref 13.0–17.0)
Immature Granulocytes: 1 %
Lymphocytes Relative: 7 %
Lymphs Abs: 0.8 10*3/uL (ref 0.7–4.0)
MCH: 28.8 pg (ref 26.0–34.0)
MCHC: 32.5 g/dL (ref 30.0–36.0)
MCV: 88.8 fL (ref 80.0–100.0)
Monocytes Absolute: 0.9 10*3/uL (ref 0.1–1.0)
Monocytes Relative: 8 %
Neutro Abs: 9 10*3/uL — ABNORMAL HIGH (ref 1.7–7.7)
Neutrophils Relative %: 81 %
Platelets: 57 10*3/uL — ABNORMAL LOW (ref 150–400)
RBC: 4.37 MIL/uL (ref 4.22–5.81)
RDW: 15.4 % (ref 11.5–15.5)
WBC: 11.2 10*3/uL — ABNORMAL HIGH (ref 4.0–10.5)
nRBC: 0 % (ref 0.0–0.2)

## 2022-08-07 LAB — BASIC METABOLIC PANEL
Anion gap: 6 (ref 5–15)
BUN: 19 mg/dL (ref 8–23)
CO2: 26 mmol/L (ref 22–32)
Calcium: 8.6 mg/dL — ABNORMAL LOW (ref 8.9–10.3)
Chloride: 110 mmol/L (ref 98–111)
Creatinine, Ser: 0.99 mg/dL (ref 0.61–1.24)
GFR, Estimated: >60 mL/min (ref >60)
Glucose, Bld: 94 mg/dL (ref 70–99)
Potassium: 4 mmol/L (ref 3.5–5.1)
Sodium: 142 mmol/L (ref 135–145)

## 2022-08-07 NOTE — Progress Notes (Signed)
No concerns. 

## 2022-08-07 NOTE — Progress Notes (Signed)
[place] OFFICE PROGRESS NOTE  Patient Care Team: [name], MD as PCP - General (Internal Medicine)   SUMMARY OF ONCOLOGIC HISTORY:  2012- CHRONIC ITP-[ BMBx; 2012- hypercellular 60%; megakaryocytes/no dyspoiesis; FISH- Neg; Dr.Pandit]; [place]- [place]-Neg for spleen/liver; HIV/hepatitis-NEG; OCT 2017- Rituxan weekly x4  # July 2017- Melanoma s/p excision [? Stage; Dr.Dasher]; status post lithotripsy [Dr. Bernardo Heater; 2019]; 2019-CT scan incidental liver lesion; likely hemangioma [Jan 2020 CT scan]; 2021- COVID  INTERVAL HISTORY: Ambulating independently.  Accompanied by his wife.  87 year old male patient with a history of ITP-currently off asprin surveillance is here for follow-up.  Patient currently s/p cataract surgery.  Surgery was uneventful.  Otherwise denies any new symptoms. Denies any unusual bleeding.  No blood in stools or black-colored stools.  Review of Systems  Constitutional: Negative.  Negative for chills, diaphoresis, fever, malaise/fatigue and weight loss.  HENT: Negative.  Negative for nosebleeds and sore throat.   Eyes: Negative.  Negative for double vision.  Respiratory: Negative.  Negative for cough, hemoptysis, sputum production, shortness of breath and wheezing.   Cardiovascular: Negative.  Negative for chest pain, palpitations, orthopnea and leg swelling.  Gastrointestinal: Negative.  Negative for abdominal pain, blood in stool, constipation, diarrhea, heartburn, melena, nausea and vomiting.  Genitourinary: Negative.  Negative for dysuria, frequency and urgency.  Musculoskeletal: Negative.  Negative for back pain and joint pain.  Skin: Negative.  Negative for itching and rash.  Neurological: Negative.  Negative for dizziness, tingling, focal weakness, weakness and headaches.  Endo/Heme/Allergies:  Bruises/bleeds easily.  Psychiatric/Behavioral: Negative.  Negative for depression. The patient is not nervous/anxious and does not have insomnia.       PAST MEDICAL HISTORY :  Past Medical History:  Diagnosis Date   CAD (coronary artery disease)    Cancer ([place])    melanoma   CHF (congestive heart failure) ([place])    2017 with pneumonia   Colon polyps    GERD (gastroesophageal reflux disease)    Gout    Hyperlipidemia    Hypertension    IDA (iron deficiency anemia)    Kidney stones    MI (myocardial infarction) ([place]) 2004   Mild anemia    PONV (postoperative nausea and vomiting)    Thrombocytopenia ([place])    followed by oncology and PCP   Wears hearing aid in both ears     PAST SURGICAL HISTORY :   Past Surgical History:  Procedure Laterality Date   BLADDER SURGERY     CARDIAC CATHETERIZATION     CATARACT EXTRACTION W/PHACO Right 02/22/2022   Procedure: CATARACT EXTRACTION PHACO AND INTRAOCULAR LENS PLACEMENT ([place]) RIGHT 11.87 01:17.3;  Surgeon: Leandrew Koyanagi, MD;  Location: [facility] [place];  Service: Ophthalmology;  Laterality: Right;   CATARACT EXTRACTION W/PHACO Left 03/08/2022   Procedure: CATARACT EXTRACTION PHACO AND INTRAOCULAR LENS PLACEMENT ([facility])LEFT MiLOOP 4.40 01:01.5;  Surgeon: Leandrew Koyanagi, MD;  Location: [facility];  Service: Ophthalmology;  Laterality: Left;   CORONARY ARTERY BYPASS GRAFT  2004   5 vessel   CYSTOSCOPY WITH LITHOLAPAXY N/A 07/05/2018   Procedure: CYSTOSCOPY WITH LITHOLAPAXY;  Surgeon: [name], MD;  Location: [facility];  Service: Urology;  Laterality: N/A;   HOLMIUM LASER APPLICATION N/A 60/73/7106   Procedure: HOLMIUM LASER APPLICATION;  Surgeon: [name], MD;  Location: [facility];  Service: Urology;  Laterality: N/A;   INGUINAL HERNIA REPAIR Right    LASER ABLATION     x 2 prostatic hypertrophy   TONSILLECTOMY  FAMILY HISTORY :   Family History  Problem Relation Age of Onset   Breast cancer Mother    Diabetes Father    Heart disease Father    Heart disease Brother    Heart disease Brother    Prostate cancer Neg Hx    Bladder Cancer  Neg Hx    Kidney cancer Neg Hx     SOCIAL HISTORY:   Social History   Tobacco Use   Smoking status: Former    Types: Cigarettes    Quit date: 1963    Years since quitting: 60.7   Smokeless tobacco: Never  Vaping Use   Vaping Use: Never used  Substance Use Topics   Alcohol use: No    Alcohol/week: 0.0 standard drinks of alcohol   Drug use: No    ALLERGIES:  is allergic to penicillins.  MEDICATIONS:  Current Outpatient Medications  Medication Sig Dispense Refill   acetaminophen (TYLENOL) 500 MG tablet Take 500 mg by mouth every 6 (six) hours as needed.     albuterol (VENTOLIN HFA) 108 (90 Base) MCG/ACT inhaler SMARTSIG:2 Puff(s) By Mouth Every 6 Hours PRN     atorvastatin (LIPITOR) 10 MG tablet Take 5 mg by mouth daily at 2 PM.      azelastine (ASTELIN) 0.1 % nasal spray Place into the nose.     CARTIA XT 240 MG 24 hr capsule Take 240 mg by mouth daily.  3   cetirizine (ZYRTEC) 10 MG tablet Take 10 mg by mouth daily.     Cholecalciferol (VITAMIN D-3 PO) Take by mouth.     diltiazem (TIAZAC) 240 MG 24 hr capsule Take by mouth.     doxycycline (VIBRAMYCIN) 100 MG capsule Take 100 mg by mouth 2 (two) times daily.     lisinopril (ZESTRIL) 10 MG tablet Take 10 mg by mouth daily.     loratadine (CLARITIN) 10 MG tablet Take 10 mg by mouth daily.     magnesium oxide (MAG-OX) 400 MG tablet magnesium oxide 400 mg (241.3 mg magnesium) tablet  TAKE 1 TABLET BY MOUTH ONCE DAILY     Multiple Vitamins-Minerals (MULTIVITAMIN WITH MINERALS) tablet Take 1 tablet by mouth daily.     Multiple Vitamins-Minerals (PRESERVISION AREDS 2 PO) Take 1 tablet by mouth 2 (two) times daily.     Omega-3 Fatty Acids (FISH OIL) 1200 MG CAPS Take 1,200 mg by mouth daily.     omeprazole (PRILOSEC) 20 MG capsule Take 20 mg by mouth daily.     psyllium (METAMUCIL) 58.6 % powder Take 1 packet by mouth at bedtime.     triamcinolone (KENALOG) 0.1 % triamcinolone acetonide 0.1 % topical cream  APPLY CREAM EXTERNALLY  TWICE DAILY TO AFFECTED AREA AS NEEDED     vitamin B-12 (CYANOCOBALAMIN) 1000 MCG tablet Take 1,000 mcg by mouth daily.     metoprolol tartrate (LOPRESSOR) 50 MG tablet Take 1 tablet by mouth in the morning and at bedtime.     Current Facility-Administered Medications  Medication Dose Route Frequency Provider Last Rate Last Admin   betamethasone acetate-betamethasone sodium phosphate (CELESTONE) injection 3 mg  3 mg Intra-articular Once Edrick Kins, DPM        PHYSICAL EXAMINATION:   BP (!) 157/71 (BP Location: Left Arm, Patient Position: Sitting, Cuff Size: Normal)   Pulse (!) 58   Temp 97.9 F (36.6 C) (Tympanic)   Ht '5\' 10"'$  (1.778 m)   Wt 183 lb (83 kg)   SpO2 98%   BMI  26.26 kg/m   Filed Weights   08/07/22 1045  Weight: 183 lb (83 kg)    Physical Exam Constitutional:      Comments: He is alone.  HENT:     Head: Normocephalic and atraumatic.     Mouth/Throat:     Pharynx: No oropharyngeal exudate.  Eyes:     Pupils: Pupils are equal, round, and reactive to light.  Cardiovascular:     Rate and Rhythm: Normal rate and regular rhythm.  Pulmonary:     Effort: No respiratory distress.     Breath sounds: No wheezing.  Abdominal:     General: Bowel sounds are normal. There is no distension.     Palpations: Abdomen is soft. There is no mass.     Tenderness: There is no abdominal tenderness. There is no guarding or rebound.  Musculoskeletal:        General: No tenderness. Normal range of motion.     Cervical back: Normal range of motion and neck supple.  Skin:    General: Skin is warm.  Neurological:     Mental Status: He is alert and oriented to person, place, and time.  Psychiatric:        Mood and Affect: Affect normal.     LABORATORY DATA:  I have reviewed the data as listed    Component Value Date/Time   NA 142 08/07/2022 1005   NA 140 12/17/2012 1229   K 4.0 08/07/2022 1005   K 4.4 12/17/2012 1229   CL 110 08/07/2022 1005   CL 106 12/17/2012 1229    CO2 26 08/07/2022 1005   CO2 26 12/17/2012 1229   GLUCOSE 94 08/07/2022 1005   GLUCOSE 77 12/17/2012 1229   BUN 19 08/07/2022 1005   BUN 19 (H) 12/17/2012 1229   CREATININE 0.99 08/07/2022 1005   CREATININE 1.05 12/17/2012 1229   CALCIUM 8.6 (L) 08/07/2022 1005   CALCIUM 8.4 (L) 12/17/2012 1229   PROT 6.8 11/29/2018 0910   ALBUMIN 3.9 11/29/2018 0910   AST 16 11/29/2018 0910   ALT 13 11/29/2018 0910   ALKPHOS 50 11/29/2018 0910   BILITOT 0.6 11/29/2018 0910   GFRNONAA >60 08/07/2022 1005   GFRNONAA >60 12/17/2012 1229   GFRAA >60 07/12/2020 0952   GFRAA >60 12/17/2012 1229    No results found for: "SPEP", "UPEP"  Lab Results  Component Value Date   WBC 11.2 (H) 08/07/2022   NEUTROABS 9.0 (H) 08/07/2022   HGB 12.6 (L) 08/07/2022   HCT 38.8 (L) 08/07/2022   MCV 88.8 08/07/2022   PLT 57 (L) 08/07/2022      Chemistry      Component Value Date/Time   NA 142 08/07/2022 1005   NA 140 12/17/2012 1229   K 4.0 08/07/2022 1005   K 4.4 12/17/2012 1229   CL 110 08/07/2022 1005   CL 106 12/17/2012 1229   CO2 26 08/07/2022 1005   CO2 26 12/17/2012 1229   BUN 19 08/07/2022 1005   BUN 19 (H) 12/17/2012 1229   CREATININE 0.99 08/07/2022 1005   CREATININE 1.05 12/17/2012 1229      Component Value Date/Time   CALCIUM 8.6 (L) 08/07/2022 1005   CALCIUM 8.4 (L) 12/17/2012 1229   ALKPHOS 50 11/29/2018 0910   AST 16 11/29/2018 0910   ALT 13 11/29/2018 0910   BILITOT 0.6 11/29/2018 0910       RADIOGRAPHIC STUDIES: I have personally reviewed the radiological images as listed and agreed with the findings  in the report. No results found.   ASSESSMENT & PLAN:   Idiopathic thrombocytopenic purpura ([place]) #Chronic ITP-platelets stable between 60-70,000s/p rituxan in 2017.  Asymptomatic.  Platelets today are 77- continue surveillance. Over all STABLE.   # As pt is asymptomatic- monitor for Would recommend treatment with thrombopoietin stimulating agent if platelets 30-40s  or  patient is bleeding.   # CAD - off Asprin [Dr.Fath]  # DISPOSITION:  # in 3 months- lab- cbc # follow up in 6 months with MD/labs- cbc/bmp-Dr.B      Cammie Sickle, MD 08/07/2022 11:29 AM

## 2022-08-07 NOTE — Assessment & Plan Note (Addendum)
#  Chronic ITP-platelets stable between 60-70,000s/p rituxan in 2017.  Asymptomatic.  Platelets today are 47- continue surveillance. Over all STABLE.   # As pt is asymptomatic- monitor for Would recommend treatment with thrombopoietin stimulating agent if platelets 30-40s  or patient is bleeding.   # CAD - off Asprin [Dr.Fath]  # DISPOSITION:  # in 3 months- lab- cbc # follow up in 6 months with MD/labs- cbc/bmp-Dr.B

## 2022-09-22 ENCOUNTER — Ambulatory Visit: Payer: PPO | Admitting: Podiatry

## 2022-09-22 DIAGNOSIS — M79674 Pain in right toe(s): Secondary | ICD-10-CM | POA: Diagnosis not present

## 2022-09-22 DIAGNOSIS — M79675 Pain in left toe(s): Secondary | ICD-10-CM

## 2022-09-22 DIAGNOSIS — B351 Tinea unguium: Secondary | ICD-10-CM

## 2022-09-22 NOTE — Progress Notes (Signed)
   SUBJECTIVE Patient presents to office today complaining of elongated, thickened nails that cause pain while ambulating in shoes.  Patient is unable to trim their own nails. Patient is here for further evaluation and treatment.  Past Medical History:  Diagnosis Date   CAD (coronary artery disease)    Cancer ([place])    melanoma   CHF (congestive heart failure) ([place])    2017 with pneumonia   Colon polyps    GERD (gastroesophageal reflux disease)    Gout    Hyperlipidemia    Hypertension    IDA (iron deficiency anemia)    Kidney stones    MI (myocardial infarction) ([place]) 2004   Mild anemia    PONV (postoperative nausea and vomiting)    Thrombocytopenia ([place])    followed by oncology and PCP   Wears hearing aid in both ears     OBJECTIVE General Patient is awake, alert, and oriented x 3 and in no acute distress. Derm Skin is dry and supple bilateral. Negative open lesions or macerations. Remaining integument unremarkable. Nails are tender, long, thickened and dystrophic with subungual debris, consistent with onychomycosis, 1-5 bilateral. No signs of infection noted. Vasc  DP and PT pedal pulses palpable bilaterally. Temperature gradient within normal limits.  Neuro Epicritic and protective threshold sensation grossly intact bilaterally.  Musculoskeletal Exam No symptomatic pedal deformities noted bilateral. Muscular strength within normal limits.  ASSESSMENT 1.  Pain due to onychomycosis of toenails both  PLAN OF CARE 1. Patient evaluated today.  2. Instructed to maintain good pedal hygiene and foot care.  3. Mechanical debridement of nails 1-5 bilaterally performed using a nail nipper. Filed with dremel without incident.  4. Return to clinic in 3 mos.    Edrick Kins, DPM [facility]  Dr. Edrick Kins, DPM    [address], [place] [address]                Office [phone]  Fax [phone]

## 2022-09-27 DIAGNOSIS — R051 Acute cough: Secondary | ICD-10-CM | POA: Diagnosis not present

## 2022-09-27 DIAGNOSIS — R509 Fever, unspecified: Secondary | ICD-10-CM | POA: Diagnosis not present

## 2022-09-27 DIAGNOSIS — J019 Acute sinusitis, unspecified: Secondary | ICD-10-CM | POA: Diagnosis not present

## 2022-09-27 DIAGNOSIS — R059 Cough, unspecified: Secondary | ICD-10-CM | POA: Diagnosis not present

## 2022-09-27 DIAGNOSIS — Z03818 Encounter for observation for suspected exposure to other biological agents ruled out: Secondary | ICD-10-CM | POA: Diagnosis not present

## 2022-09-27 DIAGNOSIS — J209 Acute bronchitis, unspecified: Secondary | ICD-10-CM | POA: Diagnosis not present

## 2022-09-27 DIAGNOSIS — B9689 Other specified bacterial agents as the cause of diseases classified elsewhere: Secondary | ICD-10-CM | POA: Diagnosis not present

## 2022-10-06 DIAGNOSIS — M1000 Idiopathic gout, unspecified site: Secondary | ICD-10-CM | POA: Diagnosis not present

## 2022-10-06 DIAGNOSIS — E7849 Other hyperlipidemia: Secondary | ICD-10-CM | POA: Diagnosis not present

## 2022-10-06 DIAGNOSIS — D693 Immune thrombocytopenic purpura: Secondary | ICD-10-CM | POA: Diagnosis not present

## 2022-10-06 DIAGNOSIS — I10 Essential (primary) hypertension: Secondary | ICD-10-CM | POA: Diagnosis not present

## 2022-10-13 DIAGNOSIS — H353132 Nonexudative age-related macular degeneration, bilateral, intermediate dry stage: Secondary | ICD-10-CM | POA: Diagnosis not present

## 2022-10-13 DIAGNOSIS — Z961 Presence of intraocular lens: Secondary | ICD-10-CM | POA: Diagnosis not present

## 2022-10-17 DIAGNOSIS — M1000 Idiopathic gout, unspecified site: Secondary | ICD-10-CM | POA: Diagnosis not present

## 2022-10-17 DIAGNOSIS — K219 Gastro-esophageal reflux disease without esophagitis: Secondary | ICD-10-CM | POA: Diagnosis not present

## 2022-10-17 DIAGNOSIS — D692 Other nonthrombocytopenic purpura: Secondary | ICD-10-CM | POA: Diagnosis not present

## 2022-10-17 DIAGNOSIS — D693 Immune thrombocytopenic purpura: Secondary | ICD-10-CM | POA: Diagnosis not present

## 2022-10-17 DIAGNOSIS — I5022 Chronic systolic (congestive) heart failure: Secondary | ICD-10-CM | POA: Diagnosis not present

## 2022-10-17 DIAGNOSIS — E7849 Other hyperlipidemia: Secondary | ICD-10-CM | POA: Diagnosis not present

## 2022-10-17 DIAGNOSIS — I700 Atherosclerosis of aorta: Secondary | ICD-10-CM | POA: Diagnosis not present

## 2022-10-17 DIAGNOSIS — D649 Anemia, unspecified: Secondary | ICD-10-CM | POA: Diagnosis not present

## 2022-10-17 DIAGNOSIS — D72829 Elevated white blood cell count, unspecified: Secondary | ICD-10-CM | POA: Diagnosis not present

## 2022-10-17 DIAGNOSIS — I10 Essential (primary) hypertension: Secondary | ICD-10-CM | POA: Diagnosis not present

## 2022-10-17 DIAGNOSIS — I480 Paroxysmal atrial fibrillation: Secondary | ICD-10-CM | POA: Diagnosis not present

## 2022-10-17 DIAGNOSIS — I2510 Atherosclerotic heart disease of native coronary artery without angina pectoris: Secondary | ICD-10-CM | POA: Diagnosis not present

## 2022-11-06 ENCOUNTER — Inpatient Hospital Stay: Payer: PPO | Attending: Internal Medicine

## 2022-11-06 DIAGNOSIS — D693 Immune thrombocytopenic purpura: Secondary | ICD-10-CM | POA: Insufficient documentation

## 2022-11-06 DIAGNOSIS — Z85820 Personal history of malignant melanoma of skin: Secondary | ICD-10-CM | POA: Diagnosis not present

## 2022-11-06 LAB — CBC WITH DIFFERENTIAL/PLATELET
Abs Immature Granulocytes: 0.18 10*3/uL — ABNORMAL HIGH (ref 0.00–0.07)
Basophils Absolute: 0.1 10*3/uL (ref 0.0–0.1)
Basophils Relative: 1 %
Eosinophils Absolute: 0.3 10*3/uL (ref 0.0–0.5)
Eosinophils Relative: 2 %
HCT: 41.1 % (ref 39.0–52.0)
Hemoglobin: 13.3 g/dL (ref 13.0–17.0)
Immature Granulocytes: 1 %
Lymphocytes Relative: 7 %
Lymphs Abs: 1 10*3/uL (ref 0.7–4.0)
MCH: 28.5 pg (ref 26.0–34.0)
MCHC: 32.4 g/dL (ref 30.0–36.0)
MCV: 88 fL (ref 80.0–100.0)
Monocytes Absolute: 1.2 10*3/uL — ABNORMAL HIGH (ref 0.1–1.0)
Monocytes Relative: 8 %
Neutro Abs: 12.3 10*3/uL — ABNORMAL HIGH (ref 1.7–7.7)
Neutrophils Relative %: 81 %
Platelets: 63 10*3/uL — ABNORMAL LOW (ref 150–400)
RBC: 4.67 MIL/uL (ref 4.22–5.81)
RDW: 15 % (ref 11.5–15.5)
WBC: 15.1 10*3/uL — ABNORMAL HIGH (ref 4.0–10.5)
nRBC: 0 % (ref 0.0–0.2)

## 2022-11-08 DIAGNOSIS — Z03818 Encounter for observation for suspected exposure to other biological agents ruled out: Secondary | ICD-10-CM | POA: Diagnosis not present

## 2022-11-08 DIAGNOSIS — J101 Influenza due to other identified influenza virus with other respiratory manifestations: Secondary | ICD-10-CM | POA: Diagnosis not present

## 2022-11-09 ENCOUNTER — Emergency Department: Payer: PPO

## 2022-11-09 ENCOUNTER — Inpatient Hospital Stay
Admission: EM | Admit: 2022-11-09 | Discharge: 2022-11-12 | DRG: 871 | Disposition: A | Discharge status: Home / Self Care | Payer: PPO | Attending: Osteopathic Medicine | Admitting: Osteopathic Medicine

## 2022-11-09 ENCOUNTER — Other Ambulatory Visit: Payer: Self-pay

## 2022-11-09 ENCOUNTER — Encounter: Payer: Self-pay | Admitting: Family Medicine

## 2022-11-09 ENCOUNTER — Inpatient Hospital Stay
Admit: 2022-11-09 | Discharge: 2022-11-09 | Disposition: A | Discharge status: Home / Self Care | Payer: PPO | Attending: Family Medicine | Admitting: Family Medicine

## 2022-11-09 DIAGNOSIS — Z951 Presence of aortocoronary bypass graft: Secondary | ICD-10-CM | POA: Diagnosis not present

## 2022-11-09 DIAGNOSIS — I2489 Other forms of acute ischemic heart disease: Secondary | ICD-10-CM | POA: Diagnosis not present

## 2022-11-09 DIAGNOSIS — Z85820 Personal history of malignant melanoma of skin: Secondary | ICD-10-CM

## 2022-11-09 DIAGNOSIS — Z9841 Cataract extraction status, right eye: Secondary | ICD-10-CM

## 2022-11-09 DIAGNOSIS — Z87891 Personal history of nicotine dependence: Secondary | ICD-10-CM | POA: Diagnosis not present

## 2022-11-09 DIAGNOSIS — J1008 Influenza due to other identified influenza virus with other specified pneumonia: Secondary | ICD-10-CM | POA: Diagnosis not present

## 2022-11-09 DIAGNOSIS — J09X1 Influenza due to identified novel influenza A virus with pneumonia: Secondary | ICD-10-CM | POA: Diagnosis not present

## 2022-11-09 DIAGNOSIS — N179 Acute kidney failure, unspecified: Secondary | ICD-10-CM | POA: Diagnosis not present

## 2022-11-09 DIAGNOSIS — Z66 Do not resuscitate: Secondary | ICD-10-CM | POA: Diagnosis present

## 2022-11-09 DIAGNOSIS — I110 Hypertensive heart disease with heart failure: Secondary | ICD-10-CM | POA: Diagnosis not present

## 2022-11-09 DIAGNOSIS — I5022 Chronic systolic (congestive) heart failure: Secondary | ICD-10-CM | POA: Diagnosis present

## 2022-11-09 DIAGNOSIS — Z8249 Family history of ischemic heart disease and other diseases of the circulatory system: Secondary | ICD-10-CM

## 2022-11-09 DIAGNOSIS — I4891 Unspecified atrial fibrillation: Secondary | ICD-10-CM | POA: Diagnosis present

## 2022-11-09 DIAGNOSIS — I252 Old myocardial infarction: Secondary | ICD-10-CM | POA: Diagnosis not present

## 2022-11-09 DIAGNOSIS — I2510 Atherosclerotic heart disease of native coronary artery without angina pectoris: Secondary | ICD-10-CM | POA: Diagnosis present

## 2022-11-09 DIAGNOSIS — R7989 Other specified abnormal findings of blood chemistry: Secondary | ICD-10-CM | POA: Diagnosis not present

## 2022-11-09 DIAGNOSIS — M109 Gout, unspecified: Secondary | ICD-10-CM | POA: Diagnosis present

## 2022-11-09 DIAGNOSIS — E538 Deficiency of other specified B group vitamins: Secondary | ICD-10-CM | POA: Diagnosis not present

## 2022-11-09 DIAGNOSIS — D472 Monoclonal gammopathy: Secondary | ICD-10-CM | POA: Diagnosis not present

## 2022-11-09 DIAGNOSIS — I10 Essential (primary) hypertension: Secondary | ICD-10-CM

## 2022-11-09 DIAGNOSIS — J45909 Unspecified asthma, uncomplicated: Secondary | ICD-10-CM | POA: Diagnosis not present

## 2022-11-09 DIAGNOSIS — D693 Immune thrombocytopenic purpura: Secondary | ICD-10-CM | POA: Diagnosis not present

## 2022-11-09 DIAGNOSIS — I959 Hypotension, unspecified: Secondary | ICD-10-CM | POA: Diagnosis not present

## 2022-11-09 DIAGNOSIS — E785 Hyperlipidemia, unspecified: Secondary | ICD-10-CM | POA: Diagnosis present

## 2022-11-09 DIAGNOSIS — D6959 Other secondary thrombocytopenia: Secondary | ICD-10-CM | POA: Diagnosis present

## 2022-11-09 DIAGNOSIS — J129 Viral pneumonia, unspecified: Secondary | ICD-10-CM | POA: Diagnosis present

## 2022-11-09 DIAGNOSIS — K219 Gastro-esophageal reflux disease without esophagitis: Secondary | ICD-10-CM | POA: Diagnosis not present

## 2022-11-09 DIAGNOSIS — A4189 Other specified sepsis: Secondary | ICD-10-CM | POA: Diagnosis not present

## 2022-11-09 DIAGNOSIS — J111 Influenza due to unidentified influenza virus with other respiratory manifestations: Secondary | ICD-10-CM

## 2022-11-09 DIAGNOSIS — W19XXXA Unspecified fall, initial encounter: Secondary | ICD-10-CM | POA: Diagnosis not present

## 2022-11-09 DIAGNOSIS — Z20822 Contact with and (suspected) exposure to covid-19: Secondary | ICD-10-CM | POA: Diagnosis not present

## 2022-11-09 DIAGNOSIS — R6520 Severe sepsis without septic shock: Secondary | ICD-10-CM | POA: Diagnosis not present

## 2022-11-09 DIAGNOSIS — H9190 Unspecified hearing loss, unspecified ear: Secondary | ICD-10-CM | POA: Diagnosis present

## 2022-11-09 DIAGNOSIS — Z880 Allergy status to penicillin: Secondary | ICD-10-CM

## 2022-11-09 DIAGNOSIS — J188 Other pneumonia, unspecified organism: Secondary | ICD-10-CM | POA: Diagnosis not present

## 2022-11-09 DIAGNOSIS — Z961 Presence of intraocular lens: Secondary | ICD-10-CM | POA: Diagnosis present

## 2022-11-09 DIAGNOSIS — E860 Dehydration: Secondary | ICD-10-CM | POA: Diagnosis not present

## 2022-11-09 DIAGNOSIS — Z974 Presence of external hearing-aid: Secondary | ICD-10-CM

## 2022-11-09 DIAGNOSIS — R531 Weakness: Secondary | ICD-10-CM | POA: Diagnosis not present

## 2022-11-09 DIAGNOSIS — R0689 Other abnormalities of breathing: Secondary | ICD-10-CM | POA: Diagnosis not present

## 2022-11-09 DIAGNOSIS — I214 Non-ST elevation (NSTEMI) myocardial infarction: Secondary | ICD-10-CM | POA: Diagnosis not present

## 2022-11-09 DIAGNOSIS — A419 Sepsis, unspecified organism: Secondary | ICD-10-CM | POA: Diagnosis not present

## 2022-11-09 DIAGNOSIS — Z9842 Cataract extraction status, left eye: Secondary | ICD-10-CM

## 2022-11-09 DIAGNOSIS — Z87442 Personal history of urinary calculi: Secondary | ICD-10-CM

## 2022-11-09 DIAGNOSIS — R0602 Shortness of breath: Secondary | ICD-10-CM | POA: Diagnosis not present

## 2022-11-09 DIAGNOSIS — Z86010 Personal history of colonic polyps: Secondary | ICD-10-CM

## 2022-11-09 DIAGNOSIS — R059 Cough, unspecified: Secondary | ICD-10-CM | POA: Diagnosis not present

## 2022-11-09 DIAGNOSIS — I447 Left bundle-branch block, unspecified: Secondary | ICD-10-CM | POA: Diagnosis present

## 2022-11-09 DIAGNOSIS — Z79899 Other long term (current) drug therapy: Secondary | ICD-10-CM

## 2022-11-09 DIAGNOSIS — R0902 Hypoxemia: Secondary | ICD-10-CM | POA: Diagnosis present

## 2022-11-09 DIAGNOSIS — N289 Disorder of kidney and ureter, unspecified: Secondary | ICD-10-CM | POA: Diagnosis present

## 2022-11-09 DIAGNOSIS — J168 Pneumonia due to other specified infectious organisms: Secondary | ICD-10-CM | POA: Diagnosis not present

## 2022-11-09 DIAGNOSIS — J189 Pneumonia, unspecified organism: Secondary | ICD-10-CM

## 2022-11-09 HISTORY — DX: Paroxysmal atrial fibrillation: I48.0

## 2022-11-09 LAB — LACTIC ACID, PLASMA: Lactic Acid, Venous: 2.7 mmol/L (ref 0.5–1.9)

## 2022-11-09 LAB — CBC WITH DIFFERENTIAL/PLATELET
Abs Immature Granulocytes: 0.24 10*3/uL — ABNORMAL HIGH (ref 0.00–0.07)
Basophils Absolute: 0.1 10*3/uL (ref 0.0–0.1)
Basophils Relative: 0 %
Eosinophils Absolute: 0 10*3/uL (ref 0.0–0.5)
Eosinophils Relative: 0 %
HCT: 39.3 % (ref 39.0–52.0)
Hemoglobin: 12.5 g/dL — ABNORMAL LOW (ref 13.0–17.0)
Immature Granulocytes: 1 %
Lymphocytes Relative: 2 %
Lymphs Abs: 0.5 10*3/uL — ABNORMAL LOW (ref 0.7–4.0)
MCH: 28.2 pg (ref 26.0–34.0)
MCHC: 31.8 g/dL (ref 30.0–36.0)
MCV: 88.5 fL (ref 80.0–100.0)
Monocytes Absolute: 1.9 10*3/uL — ABNORMAL HIGH (ref 0.1–1.0)
Monocytes Relative: 7 %
Neutro Abs: 23.7 10*3/uL — ABNORMAL HIGH (ref 1.7–7.7)
Neutrophils Relative %: 90 %
Platelets: 52 10*3/uL — ABNORMAL LOW (ref 150–400)
RBC: 4.44 MIL/uL (ref 4.22–5.81)
RDW: 15.6 % — ABNORMAL HIGH (ref 11.5–15.5)
Smear Review: NORMAL
WBC: 26.3 10*3/uL — ABNORMAL HIGH (ref 4.0–10.5)
nRBC: 0 % (ref 0.0–0.2)

## 2022-11-09 LAB — ECHOCARDIOGRAM COMPLETE
AR max vel: 1.65 cm2
AV Area VTI: 1.59 cm2
AV Area mean vel: 1.62 cm2
AV Mean grad: 6 mmHg
AV Peak grad: 9.7 mmHg
Ao pk vel: 1.56 m/s
Area-P 1/2: 4.33 cm2
Calc EF: 55.9 %
Height: 70 in
S' Lateral: 4.8 cm
Single Plane A2C EF: 59.6 %
Single Plane A4C EF: 51.5 %
Weight: 2928 oz

## 2022-11-09 LAB — COMPREHENSIVE METABOLIC PANEL
ALT: 21 U/L (ref 0–44)
AST: 29 U/L (ref 15–41)
Albumin: 3.7 g/dL (ref 3.5–5.0)
Alkaline Phosphatase: 53 U/L (ref 38–126)
Anion gap: 10 (ref 5–15)
BUN: 36 mg/dL — ABNORMAL HIGH (ref 8–23)
CO2: 21 mmol/L — ABNORMAL LOW (ref 22–32)
Calcium: 8.4 mg/dL — ABNORMAL LOW (ref 8.9–10.3)
Chloride: 111 mmol/L (ref 98–111)
Creatinine, Ser: 1.81 mg/dL — ABNORMAL HIGH (ref 0.61–1.24)
GFR, Estimated: 36 mL/min — ABNORMAL LOW (ref >60)
Glucose, Bld: 158 mg/dL — ABNORMAL HIGH (ref 70–99)
Potassium: 4.2 mmol/L (ref 3.5–5.1)
Sodium: 142 mmol/L (ref 135–145)
Total Bilirubin: 0.9 mg/dL (ref 0.3–1.2)
Total Protein: 7.1 g/dL (ref 6.5–8.1)

## 2022-11-09 LAB — BLOOD GAS, VENOUS
Acid-base deficit: 1.1 mmol/L (ref 0.0–2.0)
Bicarbonate: 24.8 mmol/L (ref 20.0–28.0)
O2 Saturation: 31.6 %
Patient temperature: 37
pCO2, Ven: 45 mmHg (ref 44–60)
pH, Ven: 7.35 (ref 7.25–7.43)
pO2, Ven: <31 mmHg — CL (ref 32–45)

## 2022-11-09 LAB — RESP PANEL BY RT-PCR (RSV, FLU A&B, COVID)  RVPGX2
Influenza A by PCR: POSITIVE — AB
Influenza B by PCR: NEGATIVE
Resp Syncytial Virus by PCR: NEGATIVE
SARS Coronavirus 2 by RT PCR: NEGATIVE

## 2022-11-09 LAB — TROPONIN I (HIGH SENSITIVITY)
Troponin I (High Sensitivity): 168 ng/L (ref <18)
Troponin I (High Sensitivity): 461 ng/L (ref <18)

## 2022-11-09 MED ORDER — HYDROCOD POLI-CHLORPHE POLI ER 10-8 MG/5ML PO SUER
5.0000 mL | Freq: Two times a day (BID) | ORAL | Status: DC | PRN
  Administered 2022-11-10 – 2022-11-11 (×2): 5 mL via ORAL
  Filled 2022-11-09 (×2): qty 5

## 2022-11-09 MED ORDER — ATORVASTATIN CALCIUM 10 MG PO TABS
5.0000 mg | ORAL_TABLET | Freq: Every day | ORAL | Status: DC
  Filled 2022-11-09: qty 0.5

## 2022-11-09 MED ORDER — GUAIFENESIN ER 600 MG PO TB12
600.0000 mg | ORAL_TABLET | Freq: Two times a day (BID) | ORAL | Status: DC
  Administered 2022-11-09 – 2022-11-12 (×7): 600 mg via ORAL
  Filled 2022-11-09 (×7): qty 1

## 2022-11-09 MED ORDER — VITAMIN B-12 1000 MCG PO TABS
1000.0000 ug | ORAL_TABLET | Freq: Every day | ORAL | Status: DC
  Administered 2022-11-09 – 2022-11-12 (×4): 1000 ug via ORAL
  Filled 2022-11-09 (×3): qty 1
  Filled 2022-11-09: qty 2

## 2022-11-09 MED ORDER — FUROSEMIDE 10 MG/ML IJ SOLN
40.0000 mg | Freq: Once | INTRAMUSCULAR | Status: AC
  Administered 2022-11-09: 40 mg via INTRAVENOUS
  Filled 2022-11-09: qty 4

## 2022-11-09 MED ORDER — ACETAMINOPHEN 650 MG RE SUPP: 650.0000 mg | Freq: Four times a day (QID) | RECTAL | Status: DC | PRN

## 2022-11-09 MED ORDER — LORATADINE 10 MG PO TABS
10.0000 mg | ORAL_TABLET | Freq: Every day | ORAL | Status: DC
  Administered 2022-11-09 – 2022-11-12 (×4): 10 mg via ORAL
  Filled 2022-11-09 (×4): qty 1

## 2022-11-09 MED ORDER — TRAZODONE HCL 50 MG PO TABS: 25.0000 mg | ORAL_TABLET | Freq: Every evening | ORAL | Status: DC | PRN

## 2022-11-09 MED ORDER — SODIUM CHLORIDE 0.9 % IV SOLN
1.0000 g | Freq: Once | INTRAVENOUS | Status: AC
  Administered 2022-11-09: 1 g via INTRAVENOUS
  Filled 2022-11-09: qty 10

## 2022-11-09 MED ORDER — SODIUM CHLORIDE 0.9 % IV BOLUS
2000.0000 mL | Freq: Once | INTRAVENOUS | Status: AC
  Administered 2022-11-09: 2000 mL via INTRAVENOUS

## 2022-11-09 MED ORDER — MAGNESIUM OXIDE 400 MG PO TABS
400.0000 mg | ORAL_TABLET | Freq: Every day | ORAL | Status: DC
  Administered 2022-11-09 – 2022-11-12 (×4): 400 mg via ORAL
  Filled 2022-11-09 (×8): qty 1

## 2022-11-09 MED ORDER — MORPHINE SULFATE (PF) 2 MG/ML IV SOLN: 2.0000 mg | INTRAVENOUS | Status: DC | PRN

## 2022-11-09 MED ORDER — ENOXAPARIN SODIUM 40 MG/0.4ML IJ SOSY: 40.0000 mg | PREFILLED_SYRINGE | INTRAMUSCULAR | Status: DC

## 2022-11-09 MED ORDER — ATORVASTATIN CALCIUM 20 MG PO TABS
80.0000 mg | ORAL_TABLET | Freq: Every day | ORAL | Status: DC
  Administered 2022-11-09 – 2022-11-11 (×3): 80 mg via ORAL
  Filled 2022-11-09 (×3): qty 4

## 2022-11-09 MED ORDER — ONDANSETRON HCL 4 MG/2ML IJ SOLN
4.0000 mg | Freq: Once | INTRAMUSCULAR | Status: DC | PRN
  Filled 2022-11-09: qty 2

## 2022-11-09 MED ORDER — AZELASTINE HCL 0.1 % NA SOLN
2.0000 | Freq: Two times a day (BID) | NASAL | Status: DC | PRN
  Administered 2022-11-12: 2 via NASAL
  Filled 2022-11-09: qty 30

## 2022-11-09 MED ORDER — OSELTAMIVIR PHOSPHATE 30 MG PO CAPS
30.0000 mg | ORAL_CAPSULE | Freq: Two times a day (BID) | ORAL | Status: DC
  Administered 2022-11-09 – 2022-11-12 (×6): 30 mg via ORAL
  Filled 2022-11-09 (×7): qty 1

## 2022-11-09 MED ORDER — OSELTAMIVIR PHOSPHATE 75 MG PO CAPS: 75.0000 mg | ORAL_CAPSULE | Freq: Two times a day (BID) | ORAL | Status: DC

## 2022-11-09 MED ORDER — IPRATROPIUM-ALBUTEROL 0.5-2.5 (3) MG/3ML IN SOLN
3.0000 mL | Freq: Four times a day (QID) | RESPIRATORY_TRACT | Status: DC
  Administered 2022-11-09: 3 mL via RESPIRATORY_TRACT
  Filled 2022-11-09: qty 3

## 2022-11-09 MED ORDER — SODIUM CHLORIDE 0.9 % IV SOLN
2.0000 g | INTRAVENOUS | Status: DC
  Administered 2022-11-10 – 2022-11-12 (×3): 2 g via INTRAVENOUS
  Filled 2022-11-09 (×2): qty 2
  Filled 2022-11-09: qty 20

## 2022-11-09 MED ORDER — ASPIRIN 81 MG PO TBEC
81.0000 mg | DELAYED_RELEASE_TABLET | Freq: Every day | ORAL | Status: DC
  Administered 2022-11-09 – 2022-11-12 (×4): 81 mg via ORAL
  Filled 2022-11-09 (×4): qty 1

## 2022-11-09 MED ORDER — ONDANSETRON HCL 4 MG PO TABS: 4.0000 mg | ORAL_TABLET | Freq: Four times a day (QID) | ORAL | Status: DC | PRN

## 2022-11-09 MED ORDER — DILTIAZEM HCL ER COATED BEADS 240 MG PO CP24
240.0000 mg | ORAL_CAPSULE | Freq: Every day | ORAL | Status: DC
  Administered 2022-11-09: 240 mg via ORAL
  Filled 2022-11-09 (×2): qty 1

## 2022-11-09 MED ORDER — LISINOPRIL 10 MG PO TABS
10.0000 mg | ORAL_TABLET | Freq: Every day | ORAL | Status: DC
  Administered 2022-11-09: 10 mg via ORAL
  Filled 2022-11-09 (×2): qty 1

## 2022-11-09 MED ORDER — STERILE WATER FOR INJECTION IJ SOLN
INTRAMUSCULAR | Status: AC
  Administered 2022-11-09: 10 mL
  Filled 2022-11-09: qty 10

## 2022-11-09 MED ORDER — IPRATROPIUM-ALBUTEROL 0.5-2.5 (3) MG/3ML IN SOLN
6.0000 mL | Freq: Once | RESPIRATORY_TRACT | Status: AC
  Administered 2022-11-09: 6 mL via RESPIRATORY_TRACT
  Filled 2022-11-09: qty 3

## 2022-11-09 MED ORDER — SODIUM CHLORIDE 0.9 % IV SOLN: INTRAVENOUS | Status: DC

## 2022-11-09 MED ORDER — OSELTAMIVIR PHOSPHATE 75 MG PO CAPS
75.0000 mg | ORAL_CAPSULE | Freq: Once | ORAL | Status: AC
  Administered 2022-11-09: 75 mg via ORAL
  Filled 2022-11-09: qty 1

## 2022-11-09 MED ORDER — PROSIGHT PO TABS
1.0000 | ORAL_TABLET | Freq: Two times a day (BID) | ORAL | Status: DC
  Administered 2022-11-09 – 2022-11-10 (×3): 1 via ORAL
  Filled 2022-11-09 (×6): qty 1

## 2022-11-09 MED ORDER — OMEGA-3-ACID ETHYL ESTERS 1 G PO CAPS
1.0000 g | ORAL_CAPSULE | Freq: Every day | ORAL | Status: DC
  Administered 2022-11-09 – 2022-11-12 (×4): 1 g via ORAL
  Filled 2022-11-09 (×4): qty 1

## 2022-11-09 MED ORDER — LEVOFLOXACIN IN D5W 750 MG/150ML IV SOLN: 750.0000 mg | Freq: Once | INTRAVENOUS | Status: DC

## 2022-11-09 MED ORDER — NITROGLYCERIN 0.4 MG SL SUBL: 0.4000 mg | SUBLINGUAL_TABLET | SUBLINGUAL | Status: DC | PRN

## 2022-11-09 MED ORDER — METHYLPREDNISOLONE SODIUM SUCC 40 MG IJ SOLR
40.0000 mg | Freq: Two times a day (BID) | INTRAMUSCULAR | Status: DC
  Administered 2022-11-09 – 2022-11-10 (×4): 40 mg via INTRAVENOUS
  Filled 2022-11-09 (×4): qty 1

## 2022-11-09 MED ORDER — PSYLLIUM 95 % PO PACK
1.0000 | PACK | Freq: Every day | ORAL | Status: DC
  Administered 2022-11-09 – 2022-11-12 (×4): 1 via ORAL
  Filled 2022-11-09 (×4): qty 1

## 2022-11-09 MED ORDER — SODIUM CHLORIDE 0.9 % IV SOLN
500.0000 mg | Freq: Once | INTRAVENOUS | Status: AC
  Administered 2022-11-09: 500 mg via INTRAVENOUS
  Filled 2022-11-09: qty 5

## 2022-11-09 MED ORDER — METOPROLOL TARTRATE 50 MG PO TABS
50.0000 mg | ORAL_TABLET | Freq: Two times a day (BID) | ORAL | Status: DC
  Administered 2022-11-09 (×2): 50 mg via ORAL
  Filled 2022-11-09 (×3): qty 1

## 2022-11-09 MED ORDER — ADULT MULTIVITAMIN W/MINERALS CH
1.0000 | ORAL_TABLET | Freq: Every day | ORAL | Status: DC
  Administered 2022-11-09 – 2022-11-12 (×4): 1 via ORAL
  Filled 2022-11-09 (×4): qty 1

## 2022-11-09 MED ORDER — IPRATROPIUM-ALBUTEROL 0.5-2.5 (3) MG/3ML IN SOLN
3.0000 mL | Freq: Four times a day (QID) | RESPIRATORY_TRACT | Status: DC
  Administered 2022-11-09 – 2022-11-10 (×6): 3 mL via RESPIRATORY_TRACT
  Filled 2022-11-09 (×6): qty 3

## 2022-11-09 MED ORDER — ACETAMINOPHEN 325 MG PO TABS
650.0000 mg | ORAL_TABLET | Freq: Once | ORAL | Status: AC
  Administered 2022-11-09: 650 mg via ORAL
  Filled 2022-11-09: qty 2

## 2022-11-09 MED ORDER — PANTOPRAZOLE SODIUM 40 MG PO TBEC
40.0000 mg | DELAYED_RELEASE_TABLET | Freq: Every day | ORAL | Status: DC
  Administered 2022-11-09 – 2022-11-12 (×4): 40 mg via ORAL
  Filled 2022-11-09 (×4): qty 1

## 2022-11-09 MED ORDER — PERFLUTREN LIPID MICROSPHERE
1.0000 mL | INTRAVENOUS | Status: AC | PRN
  Administered 2022-11-09: 2 mL via INTRAVENOUS

## 2022-11-09 MED ORDER — ATORVASTATIN CALCIUM 20 MG PO TABS: 80.0000 mg | ORAL_TABLET | Freq: Every day | ORAL | Status: DC

## 2022-11-09 MED ORDER — ACETAMINOPHEN 325 MG PO TABS
650.0000 mg | ORAL_TABLET | Freq: Four times a day (QID) | ORAL | Status: DC | PRN
  Administered 2022-11-12: 650 mg via ORAL
  Filled 2022-11-09: qty 2

## 2022-11-09 MED ORDER — ONDANSETRON HCL 4 MG/2ML IJ SOLN: 4.0000 mg | Freq: Four times a day (QID) | INTRAMUSCULAR | Status: DC | PRN

## 2022-11-09 MED ORDER — SODIUM CHLORIDE 0.9 % IV SOLN
500.0000 mg | INTRAVENOUS | Status: DC
  Administered 2022-11-10 – 2022-11-12 (×3): 500 mg via INTRAVENOUS
  Filled 2022-11-09 (×2): qty 500
  Filled 2022-11-09: qty 5

## 2022-11-09 NOTE — ED Notes (Signed)
Admitting MD Mansy at bedside

## 2022-11-09 NOTE — Progress Notes (Signed)
Brief hospitalist update note.  This is a nonbillable note.  Please see same-day H&P for full billable details.  Briefly, this is a 87 year old male who presents for severe weakness.  He is found to have evidence of influenza A.  Also high suspicion for concomitant superimposed bacterial pneumonia.  Started on intravenous antibiotics.  Started on intravenous fluids however patient developed respiratory distress.  Treated fluids stopped at this time.  Also had elevated troponin.  Felt to be due to demand ischemia.  Presentation not entirely consistent with ACS.  Cardiology consulted with no plans for ischemic evaluation.  Follow-up 2D echocardiogram.  Continue antibiotics as above.  Ralene Muskrat MD  No charge

## 2022-11-09 NOTE — ED Notes (Signed)
Pt placed on bedpan to have BM °

## 2022-11-09 NOTE — ED Notes (Signed)
Cardiologist at bedside.  

## 2022-11-09 NOTE — ED Notes (Signed)
Pt stating he feels much better, work of breathing has improved. Will trial off O2 at this time.

## 2022-11-09 NOTE — H&P (Addendum)
[place]   PATIENT NAME: Levi Figueroa    MR#:  [id number]  DATE OF BIRTH:  October 05, 1935  DATE OF ADMISSION:  11/09/2022  PRIMARY CARE PHYSICIAN: [name], MD   Patient is coming from: Home  REQUESTING/REFERRING PHYSICIAN: Lucillie Garfinkel, MD  CHIEF COMPLAINT:   Chief Complaint  Patient presents with   Weakness    HISTORY OF PRESENT ILLNESS:  Levi Figueroa is a 87 y.o. Caucasian male with medical history significant for CAD, systolic CHF, GERD, hypertension, dyslipidemia, atrial fibrillation, MGUS, ITP, BPH, gout, melanoma and hearing loss, presented to the emergency room with acute onset of generalized weakness with associated cough productive of yellowish sputum with associated wheezing and dyspnea and nasal and sinus congestion as well as rhinorrhea and postnasal drip fatigue and tiredness as well as fever and chills.  He was seen by his primary care physician yesterday who prescribed Tamiflu as well as Zithromax for influenza and possible bacterial pneumonia secondarily.  He has not started Tamiflu yet.  Patient's symptoms however have been worsening.  They started on Monday.  EMS reported hypoxemia with pulse currently in the mid 80s at improved with 4 L of O2 by nasal cannula.  He was transitioned to room air when he came to the ER with oximetry improved to the mid 90s.  He has not been having much appetite and was having difficulty getting out of his bed.  With nausea without vomiting or diarrhea.  No melena or rectal bleeding per rectum.  No chest pain or palpitations.  ED Course: 40 to the ER BP was 139/118 with heart rate 105 with a respiratory rate of 27.  Pulse oximetry was 94% on room air.  VBG showed pH 7.35 and HCO3 24.8 with pCO2 45 and pO2 less than 31.  CMP revealed a glucose of 158, CO2 21, BUN 36 and creatinine 1.81 with a calcium of 8.4.  BUN and creatinine were previously normal in September of this year.  Blood test came back elevated at 2.7 CBC showed  leukocytosis 15.1 with thrombocytopenia of 63.  RSV and COVID-19 PCR came back negative.  Influenza A came back positive and influenza B was negative.  2 blood cultures were drawn.  High sensitive troponin I came back 168.  EKG as reviewed by me : EKG showed sinus tachycardia with a rate of 105 with probable left atrial enlargement and left bundle branch block Imaging: Portable chest x-ray showed patchy right basilar infiltrates.  The patient was given IV Rocephin and Zithromax, 650 mg p.o. Tylenol, DuoNeb and 2 L bolus of IV normal saline.  He will be admitted to a medical telemetry bed for further evaluation and management. PAST MEDICAL HISTORY:   Past Medical History:  Diagnosis Date   CAD (coronary artery disease)    Cancer ([place])    melanoma   CHF (congestive heart failure) ([place])    2017 with pneumonia   Colon polyps    GERD (gastroesophageal reflux disease)    Gout    Hyperlipidemia    Hypertension    IDA (iron deficiency anemia)    Kidney stones    MI (myocardial infarction) ([place]) 2004   Mild anemia    PONV (postoperative nausea and vomiting)    Thrombocytopenia ([place])    followed by oncology and PCP   Wears hearing aid in both ears     PAST SURGICAL HISTORY:   Past Surgical History:  Procedure Laterality Date  BLADDER SURGERY     CARDIAC CATHETERIZATION     CATARACT EXTRACTION W/PHACO Right 02/22/2022   Procedure: CATARACT EXTRACTION PHACO AND INTRAOCULAR LENS PLACEMENT (IOC) RIGHT 11.87 01:17.3;  Surgeon: Leandrew Koyanagi, MD;  Location: [place];  Service: Ophthalmology;  Laterality: Right;   CATARACT EXTRACTION W/PHACO Left 03/08/2022   Procedure: CATARACT EXTRACTION PHACO AND INTRAOCULAR LENS PLACEMENT (IOC)LEFT MiLOOP 4.40 01:01.5;  Surgeon: Leandrew Koyanagi, MD;  Location: [place];  Service: Ophthalmology;  Laterality: Left;   CORONARY ARTERY BYPASS GRAFT  2004   5 vessel   CYSTOSCOPY WITH LITHOLAPAXY N/A 07/05/2018   Procedure:  CYSTOSCOPY WITH LITHOLAPAXY;  Surgeon: [name], MD;  Location: [facility];  Service: Urology;  Laterality: N/A;   HOLMIUM LASER APPLICATION N/A 99/35/7017   Procedure: HOLMIUM LASER APPLICATION;  Surgeon: [name], MD;  Location: [facility];  Service: Urology;  Laterality: N/A;   INGUINAL HERNIA REPAIR Right    LASER ABLATION     x 2 prostatic hypertrophy   TONSILLECTOMY      SOCIAL HISTORY:   Social History   Tobacco Use   Smoking status: Former    Types: Cigarettes    Quit date: 1963    Years since quitting: 60.9   Smokeless tobacco: Never  Substance Use Topics   Alcohol use: No    Alcohol/week: 0.0 standard drinks of alcohol    FAMILY HISTORY:   Family History  Problem Relation Age of Onset   Breast cancer Mother    Diabetes Father    Heart disease Father    Heart disease Brother    Heart disease Brother    Prostate cancer Neg Hx    Bladder Cancer Neg Hx    Kidney cancer Neg Hx     DRUG ALLERGIES:   Allergies  Allergen Reactions   Penicillins Swelling    Has patient had a PCN reaction causing immediate rash, facial/tongue/throat swelling, SOB or lightheadedness with hypotension: Yes Has patient had a PCN reaction causing severe rash involving mucus membranes or skin necrosis: No Has patient had a PCN reaction that required hospitalization No Has patient had a PCN reaction occurring within the last 10 years: No If all of the above answers are "NO", then may proceed with Cephalosporin use.     REVIEW OF SYSTEMS:   ROS As per history of present illness. All pertinent systems were reviewed above. Constitutional, HEENT, cardiovascular, respiratory, GI, GU, musculoskeletal, neuro, psychiatric, endocrine, integumentary and hematologic systems were reviewed and are otherwise negative/unremarkable except for positive findings mentioned above in the HPI.   MEDICATIONS AT HOME:   Prior to Admission medications   Medication Sig Start Date End Date  Taking? Authorizing Provider  acetaminophen (TYLENOL) 500 MG tablet Take 500 mg by mouth every 6 (six) hours as needed.    [provider]  albuterol (VENTOLIN HFA) 108 (90 Base) MCG/ACT inhaler SMARTSIG:2 Puff(s) By Mouth Every 6 Hours PRN 12/13/21   [provider]  atorvastatin (LIPITOR) 10 MG tablet Take 5 mg by mouth daily at 2 PM.     [provider]  azelastine (ASTELIN) 0.1 % nasal spray Place into the nose. 12/06/21   [provider]  CARTIA XT 240 MG 24 hr capsule Take 240 mg by mouth daily. 05/25/18   [provider]  cetirizine (ZYRTEC) 10 MG tablet Take 10 mg by mouth daily.    [provider]  Cholecalciferol (VITAMIN D-3 PO) Take by mouth.    [provider]  diltiazem (TIAZAC) 240 MG 24 hr capsule Take by mouth. 11/09/21   [provider]  doxycycline (VIBRAMYCIN) 100 MG capsule Take 100 mg by mouth 2 (two) times daily. 12/13/21   [provider]  lisinopril (ZESTRIL) 10 MG tablet Take 10 mg by mouth daily. 11/09/21   [provider]  loratadine (CLARITIN) 10 MG tablet Take 10 mg by mouth daily.    [provider]  magnesium oxide (MAG-OX) 400 MG tablet magnesium oxide 400 mg (241.3 mg magnesium) tablet  TAKE 1 TABLET BY MOUTH ONCE DAILY 11/08/20   [provider]  metoprolol tartrate (LOPRESSOR) 50 MG tablet Take 1 tablet by mouth in the morning and at bedtime. 10/02/19 03/08/22  [provider]  Multiple Vitamins-Minerals (MULTIVITAMIN WITH MINERALS) tablet Take 1 tablet by mouth daily.    [provider]  Multiple Vitamins-Minerals (PRESERVISION AREDS 2 PO) Take 1 tablet by mouth 2 (two) times daily.    [provider]  Omega-3 Fatty Acids (FISH OIL) 1200 MG CAPS Take 1,200 mg by mouth daily.    [provider]  omeprazole (PRILOSEC) 20 MG capsule Take 20 mg by mouth daily.    [provider]  psyllium (METAMUCIL) 58.6 % powder Take 1  packet by mouth at bedtime.    [provider]  triamcinolone (KENALOG) 0.1 % triamcinolone acetonide 0.1 % topical cream  APPLY CREAM EXTERNALLY TWICE DAILY TO AFFECTED AREA AS NEEDED    [provider]  vitamin B-12 (CYANOCOBALAMIN) 1000 MCG tablet Take 1,000 mcg by mouth daily.    [provider]      VITAL SIGNS:  Blood pressure 136/61, pulse 100, temperature 98.8 F (37.1 C), temperature source Oral, resp. rate (!) 25, height '5\' 10"'$  (1.778 m), weight 83 kg, SpO2 96 %.  PHYSICAL EXAMINATION:  Physical Exam  GENERAL:  87 y.o.-year-old male patient lying in the bed with mild respiratory distress with conversational dyspnea EYES: Pupils equal, round, reactive to light and accommodation. No scleral icterus. Extraocular muscles intact.  HEENT: Head atraumatic, normocephalic. Oropharynx and nasopharynx clear.  NECK:  Supple, no jugular venous distention. No thyroid enlargement, no tenderness.  LUNGS: Diminished bibasilar breath sounds with right basal crackles. No use of accessory muscles of respiration.  CARDIOVASCULAR: Regular rate and rhythm, S1, S2 normal. No murmurs, rubs, or gallops.  ABDOMEN: Soft, nondistended, nontender. Bowel sounds present. No organomegaly or mass.  EXTREMITIES: No pedal edema, cyanosis, or clubbing.  NEUROLOGIC: Cranial nerves II through XII are intact. Muscle strength 5/5 in all extremities. Sensation intact. Gait not checked.  PSYCHIATRIC: The patient is alert and oriented x 3.  Normal affect and good eye contact. SKIN: No obvious rash, lesion, or ulcer.   LABORATORY PANEL:   CBC Recent Labs  Lab 11/09/22 0411  WBC 26.3*  HGB 12.5*  HCT 39.3  PLT 52*   ------------------------------------------------------------------------------------------------------------------  Chemistries  Recent Labs  Lab 11/09/22 0411  NA 142  K 4.2  CL 111  CO2 21*  GLUCOSE 158*  BUN 36*  CREATININE 1.81*  CALCIUM 8.4*  AST 29  ALT 21   ALKPHOS 53  BILITOT 0.9   ------------------------------------------------------------------------------------------------------------------  Cardiac Enzymes No results for input(s): "TROPONINI" in the last 168 hours. ------------------------------------------------------------------------------------------------------------------  RADIOLOGY:  DG Chest Port 1 View  Result Date: 11/09/2022 CLINICAL DATA:  Cough and shortness of breath and recent positive flu test, initial encounter EXAM: PORTABLE CHEST 1 VIEW COMPARISON:  09/12/2016 FINDINGS: Cardiac shadow is mildly prominent.  Postsurgical changes are noted. Aortic calcifications are seen. Patchy basilar infiltrate is noted on the right. No sizable effusion is seen. IMPRESSION: Patchy right basilar infiltrate. Electronically Signed   By: Inez Catalina M.D.   On: 11/09/2022 03:48      IMPRESSION AND PLAN:  Assessment and Plan: * Sepsis due to pneumonia ([place]) - Sepsis is manifested by tachycardia, tachypnea and leukocytosis. - The patient meets severe sepsis criteria based on his elevated lactic acid of 2.7, AKI and initial hypoxia. - He will be admitted to a medical telemetry bed. - We will continue to biotic therapy with IV Levaquin. - Mucolytic therapy will be provided. - He will be placed on DuoNebs on scheduled basis. - We will follow blood cultures.  Influenza A with pneumonia - He will be started on p.o. Tamiflu.  AKI (acute kidney injury) ([place]) - This is likely prerenal due to volume depletion and dehydration. - He will be hydrated with IV normal saline and will follow his BMP. - We will avoid nephrotoxins.  Elevated troponin I level - This could be related to an NSTEMI and could be demand ischemia due to above problems. - He has thrombocytopenia and therefore IV heparin will not be given. - We will place him on a baby aspirin and high-dose statin as well as as needed sublingual nitroglycerin and IV morphine sulfate if  he has chest pain.  He is pain-free currently. - 2D echo and cardiology consult will be obtained. - I notified Dr. Clayborn Bigness about the patient.  Essential hypertension - We will continue his antihypertensives.  Vitamin B12 deficiency - We will Continue vitamin B12.  GERD without esophagitis - We will continue PPI therapy.     DVT prophylaxis: Lovenox.  Advanced Care Planning:  Code Status: full code.  Family Communication:  The plan of care was discussed in details with the patient (and family). I answered all questions. The patient agreed to proceed with the above mentioned plan. Further management will depend upon hospital course. Disposition Plan: Back to previous home environment Consults called: none.  All the records are reviewed and case discussed with ED provider.  Status is: Inpatient    At the time of the admission, it appears that the appropriate admission status for this patient is inpatient.  This is judged to be reasonable and necessary in order to provide the required intensity of service to ensure the patient's safety given the presenting symptoms, physical exam findings and initial radiographic and laboratory data in the context of comorbid conditions.  The patient requires inpatient status due to high intensity of service, high risk of further deterioration and high frequency of surveillance required.  I certify that at the time of admission, it is my clinical judgment that the patient will require inpatient hospital care extending more than 2 midnights.                            Dispo: The patient is from: Home              Anticipated d/c is to: Home              Patient currently is not medically stable to d/c.              Difficult to place patient: No  Christel Mormon M.D on 11/09/2022 at 6:52 AM  [facility]   From 7 PM-7 AM, contact night-coverage [email or web address]  CC: Primary care  physician; [name], MD

## 2022-11-09 NOTE — Assessment & Plan Note (Signed)
-   This is likely prerenal due to volume depletion and dehydration. - He will be hydrated with IV normal saline and will follow his BMP. - We will avoid nephrotoxins.

## 2022-11-09 NOTE — Assessment & Plan Note (Addendum)
-   He will be started on p.o. Tamiflu.

## 2022-11-09 NOTE — Assessment & Plan Note (Signed)
-   We will Continue vitamin B12.

## 2022-11-09 NOTE — Assessment & Plan Note (Signed)
-   This could be related to an NSTEMI and could be demand ischemia due to above problems. - He has thrombocytopenia and therefore IV heparin will not be given. - We will place him on a baby aspirin and high-dose statin as well as as needed sublingual nitroglycerin and IV morphine sulfate if he has chest pain.  He is pain-free currently. - 2D echo and cardiology consult will be obtained. - I notified Dr. Clayborn Bigness about the patient.

## 2022-11-09 NOTE — ED Notes (Signed)
Pt did most peri care himself but this RN assisted as needed. Pt had episode of diarrhea and urinated some in bedside toilet. Pt stated he sometimes has episodes like this depending on what he eats. Family back to bedside.

## 2022-11-09 NOTE — ED Notes (Signed)
Pt assisted to sit up on side of bed to eat as requested. Family remains at bedside. Bed locked low, rail up and call bell within reach. Pt eating dinner.

## 2022-11-09 NOTE — ED Notes (Signed)
Pt updated. Pt alert and calmly resting in bed. Pt watching tv.

## 2022-11-09 NOTE — Assessment & Plan Note (Addendum)
-   Sepsis is manifested by tachycardia, tachypnea and leukocytosis. - The patient meets severe sepsis criteria based on his elevated lactic acid of 2.7, AKI and initial hypoxia. - He will be admitted to a medical telemetry bed. - We will continue to biotic therapy with IV Levaquin. - Mucolytic therapy will be provided. - He will be placed on DuoNebs on scheduled basis. - We will follow blood cultures.

## 2022-11-09 NOTE — Assessment & Plan Note (Signed)
-   We will continue his antihypertensives. 

## 2022-11-09 NOTE — ED Notes (Signed)
Pt dowsing intermittently; when awake pt denies pain; visitor remains at bedside; pt given another blanket as requested; visitor given tv remote; pt denies any needs currently. 89% RA briefly noted while pt sleeping; pt notified if his O2 drops and sustains below 92% this RN will apply [place]; denies CPAP at home. Pt currently 95% RA while awake.

## 2022-11-09 NOTE — ED Notes (Signed)
Secretary setting up transport for pt to go to room 120.

## 2022-11-09 NOTE — Consult Note (Signed)
CARDIOLOGY CONSULT NOTE               Patient ID: Levi Figueroa MRN: [id number] DOB/AGE: [birth year] 87 y.o.  Admit date: 11/09/2022 Referring Physician Dr. Eugenie Norrie Primary Physician Dr. Remo Lipps Primary Cardiologist Dr. Jordan Hawks Reason for Consultation shortness of breath borderline troponins known coronary artery disease + heart failure  HPI: Patient 87 year old male history of known coronary disease including coronary bypass surgery in the past history of chronic systolic congestive heart failure GERD hypertension dyslipidemia atrial fibrillation ITP myelodysplastic syndrome presented with generalized weakness cough congestion recently diagnosed with influenza got progressively worse hypoxic possibly apneic and unresponsive she was brought in for further evaluation placed on supplemental oxygen CT scan suggested right lower lobe pneumonia patient was positive for influenza A and negative for B- for COVID patient was treated aggressively with Zithromax Rocephin nebulizers he had a borderline troponin so cardiology was consulted denied any chest pain  Review of systems complete and found to be negative unless listed above     Past Medical History:  Diagnosis Date   CAD (coronary artery disease)    Cancer ([place])    melanoma   CHF (congestive heart failure) ([place])    2017 with pneumonia   Colon polyps    GERD (gastroesophageal reflux disease)    Gout    Hyperlipidemia    Hypertension    IDA (iron deficiency anemia)    Kidney stones    MI (myocardial infarction) ([place]) 2004   Mild anemia    PONV (postoperative nausea and vomiting)    Thrombocytopenia ([place])    followed by oncology and PCP   Wears hearing aid in both ears     Past Surgical History:  Procedure Laterality Date   BLADDER SURGERY     CARDIAC CATHETERIZATION     CATARACT EXTRACTION W/PHACO Right 02/22/2022   Procedure: CATARACT EXTRACTION PHACO AND INTRAOCULAR LENS PLACEMENT ([place]) RIGHT 11.87 01:17.3;   Surgeon: Leandrew Koyanagi, MD;  Location: [place];  Service: Ophthalmology;  Laterality: Right;   CATARACT EXTRACTION W/PHACO Left 03/08/2022   Procedure: CATARACT EXTRACTION PHACO AND INTRAOCULAR LENS PLACEMENT (IOC)LEFT MiLOOP 4.40 01:01.5;  Surgeon: Leandrew Koyanagi, MD;  Location: [place];  Service: Ophthalmology;  Laterality: Left;   CORONARY ARTERY BYPASS GRAFT  2004   5 vessel   CYSTOSCOPY WITH LITHOLAPAXY N/A 07/05/2018   Procedure: CYSTOSCOPY WITH LITHOLAPAXY;  Surgeon: [name], MD;  Location: [facility];  Service: Urology;  Laterality: N/A;   HOLMIUM LASER APPLICATION N/A 03/47/4259   Procedure: HOLMIUM LASER APPLICATION;  Surgeon: [name], MD;  Location: [facility];  Service: Urology;  Laterality: N/A;   INGUINAL HERNIA REPAIR Right    LASER ABLATION     x 2 prostatic hypertrophy   TONSILLECTOMY      (Not in a hospital admission)  Social History   Socioeconomic History   Marital status: Married    Spouse name: Not on file   Number of children: Not on file   Years of education: Not on file   Highest education level: Not on file  Occupational History   Not on file  Tobacco Use   Smoking status: Former    Types: Cigarettes    Quit date: 1963    Years since quitting: 60.9   Smokeless tobacco: Never  Vaping Use   Vaping Use: Never used  Substance and Sexual Activity   Alcohol use: No    Alcohol/week: 0.0 standard drinks of  alcohol   Drug use: No   Sexual activity: Yes  Other Topics Concern   Not on file  Social History Narrative   Not on file   Social Determinants of Health   Financial Resource Strain: Not on file  Food Insecurity: Not on file  Transportation Needs: Not on file  Physical Activity: Not on file  Stress: Not on file  Social Connections: Not on file  Intimate Partner Violence: Not on file    Family History  Problem Relation Age of Onset   Breast cancer Mother    Diabetes Father    Heart disease  Father    Heart disease Brother    Heart disease Brother    Prostate cancer Neg Hx    Bladder Cancer Neg Hx    Kidney cancer Neg Hx       Review of systems complete and found to be negative unless listed above      PHYSICAL EXAM  General: Well developed, well nourished, in mild respiratory distress with supplemental oxygen acute distress HEENT:  Normocephalic and atramatic Neck:  No JVD.  Lungs: Bilateral coarse rhonchi bilaterally to auscultation and percussion. Heart: HRRR . Normal S1 and S2 without gallops or murmurs.  Abdomen: Bowel sounds are positive, abdomen soft and non-tender  Msk:  Back normal, normal gait. Normal strength and tone for age. Extremities: No clubbing, cyanosis or edema.   Neuro: Alert and oriented X 3. Psych:  Good affect, responds appropriately  Labs:   Lab Results  Component Value Date   WBC 26.3 (H) 11/09/2022   HGB 12.5 (L) 11/09/2022   HCT 39.3 11/09/2022   MCV 88.5 11/09/2022   PLT 52 (L) 11/09/2022    Recent Labs  Lab 11/09/22 0411  NA 142  K 4.2  CL 111  CO2 21*  BUN 36*  CREATININE 1.81*  CALCIUM 8.4*  PROT 7.1  BILITOT 0.9  ALKPHOS 53  ALT 21  AST 29  GLUCOSE 158*   Lab Results  Component Value Date   TROPONINI 0.03 (HH) 09/13/2016    Lab Results  Component Value Date   CHOL 86 09/13/2016   Lab Results  Component Value Date   HDL 15 (L) 09/13/2016   Lab Results  Component Value Date   LDLCALC 37 09/13/2016   Lab Results  Component Value Date   TRIG 169 (H) 09/13/2016   Lab Results  Component Value Date   CHOLHDL 5.7 09/13/2016   No results found for: "LDLDIRECT"    Radiology: [facility] Chest Port 1 View  Result Date: 11/09/2022 CLINICAL DATA:  Cough and shortness of breath and recent positive flu test, initial encounter EXAM: PORTABLE CHEST 1 VIEW COMPARISON:  09/12/2016 FINDINGS: Cardiac shadow is mildly prominent. Postsurgical changes are noted. Aortic calcifications are seen. Patchy basilar infiltrate is  noted on the right. No sizable effusion is seen. IMPRESSION: Patchy right basilar infiltrate. Electronically Signed   By: Inez Catalina M.D.   On: 11/09/2022 03:48    EKG: Sinus tachycardia left bundle branch block nonspecific ST-T wave changes  ASSESSMENT AND PLAN:  Respiratory failure Pneumonia Influenza A Borderline troponin Hypertension GERD Generalized weakness Shortness of breath Chronic artery disease  Plan Agree with admit to telemetry continue supplemental oxygen therapy inhalers as necessary Maintain broad-spectrum antibiotic therapy for pneumonia Supportive care for what appears to be a viral pneumonia Recommend heart failure management and treatment diuretics inhalers Troponins more consistent with demand ischemia rather than a non-STEMI we will follow-up additional EKGs  and troponins Echocardiogram be helpful for assessment of ventricular function wall motion Consider pulmonary input for pneumonia respiratory distress Will maintain a conservative cardiac input no invasive procedures planned  Signed: Yolonda Kida MD, 11/09/2022, 10:44 AM

## 2022-11-09 NOTE — ED Provider Notes (Addendum)
[facility] Provider Note    Event Date/Time   First MD Initiated Contact with Patient 11/09/22 [phone]     (approximate)   History   Weakness   HPI  Levi Figueroa is a 87 y.o. male   Past medical history of CAD, melanoma, CHF, hypertension hyperlipidemia, iron deficient anemia, kidney stones, thrombocytopenia, presents to the emergency department with influenza.  Has had 3 days of symptoms including cough, nasal congestion, rhinorrhea, postnasal drainage, fatigue, malaise and fever.  He was seen by his primary doctor today and prescribed Tamiflu as well as azithromycin for potential secondary bacterial pneumonia.  He came to the emergency department tonight because his symptoms worsened.  EMS reports hypoxemia to the mid 80s that improved on 4 L nasal cannula.  He was transitioned to room air when he arrived to the emergency department in the mid 90s.  He has profound weakness and has difficulty getting up out of bed, has had poor p.o. intake due to loss of appetite and some nausea but no vomiting, he states he has a history of asthma and states that his breathing is more labored consistent with prior asthma exacerbations.  He denies abdominal pain or chest pain.  No urinary symptoms.  History was obtained via the patient. I reviewed external medical notes including the primary care visit from 11/08/2022 as detailed above.      Physical Exam   Triage Vital Signs: ED Triage Vitals  Enc Vitals Group     BP      Pulse      Resp      Temp      Temp src      SpO2      Weight      Height      Head Circumference      Peak Flow      Pain Score      Pain Loc      Pain Edu?      Excl. in [place]?     Most recent vital signs: Vitals:   11/09/22 0515 11/09/22 0530  BP:    Pulse: (!) 107 (!) 107  Resp: (!) 25 (!) 23  Temp:    SpO2: 92% 93%    General: Awake, no distress.  CV:  Dry mucous membranes and poor skin turgor consistent with mild  dehydration Resp:  Is tachypneic to 27 and speaking in full sentences, he has some scant wheezing at the apices bilaterally Abd:  No distention.  Nontender to palpation Other:  Afebrile, awake alert and oriented.  Tachycardic.   ED Results / Procedures / Treatments   Labs (all labs ordered are listed, but only abnormal results are displayed) Labs Reviewed  RESP PANEL BY RT-PCR (RSV, FLU A&B, COVID)  RVPGX2 - Abnormal; Notable for the following components:      Result Value   Influenza A by PCR POSITIVE (*)    All other components within normal limits  LACTIC ACID, PLASMA - Abnormal; Notable for the following components:   Lactic Acid, Venous 2.7 (*)    All other components within normal limits  COMPREHENSIVE METABOLIC PANEL - Abnormal; Notable for the following components:   CO2 21 (*)    Glucose, Bld 158 (*)    BUN 36 (*)    Creatinine, Ser 1.81 (*)    Calcium 8.4 (*)    GFR, Estimated 36 (*)    All other components within normal limits  CBC WITH DIFFERENTIAL/PLATELET -  Abnormal; Notable for the following components:   WBC 26.3 (*)    Hemoglobin 12.5 (*)    RDW 15.6 (*)    Platelets 52 (*)    Neutro Abs 23.7 (*)    Lymphs Abs 0.5 (*)    Monocytes Absolute 1.9 (*)    Abs Immature Granulocytes 0.24 (*)    All other components within normal limits  BLOOD GAS, VENOUS - Abnormal; Notable for the following components:   pO2, Ven <31 (*)    All other components within normal limits  CULTURE, BLOOD (ROUTINE X 2)  CULTURE, BLOOD (ROUTINE X 2)  LACTIC ACID, PLASMA  URINALYSIS, COMPLETE (UACMP) WITH MICROSCOPIC  TROPONIN I (HIGH SENSITIVITY)  TROPONIN I (HIGH SENSITIVITY)     I reviewed labs and they are notable for AKI Cr 1.81 baseline 1 and lactic 2.7  EKG  ED ECG REPORT I, Lucillie Garfinkel, the attending physician, personally viewed and interpreted this ECG.   Date: 11/09/2022  EKG Time: 0338  Rate: 105  Rhythm: sinus tachycardia  Intervals:left bundle branch block   ST&T Change: No acute ischemic changes    RADIOLOGY I independently reviewed and interpreted chest x-ray and see opacities to the right mid/lower lung   PROCEDURES:  Critical Care performed: Yes, see critical care procedure note(s)  .Critical Care  Performed by: Lucillie Garfinkel, MD Authorized by: Lucillie Garfinkel, MD   Critical care provider statement:    Critical care time (minutes):  30   Critical care was necessary to treat or prevent imminent or life-threatening deterioration of the following conditions:  Sepsis   Critical care was time spent personally by me on the following activities:  Development of treatment plan with patient or surrogate, discussions with consultants, evaluation of patient's response to treatment, examination of patient, ordering and review of laboratory studies, ordering and review of radiographic studies, ordering and performing treatments and interventions, pulse oximetry, re-evaluation of patient's condition and review of old charts    MEDICATIONS ORDERED IN ED: Medications  atorvastatin (LIPITOR) tablet 5 mg (has no administration in time range)  diltiazem (CARDIZEM CD) 24 hr capsule 240 mg (has no administration in time range)  lisinopril (ZESTRIL) tablet 10 mg (has no administration in time range)  metoprolol tartrate (LOPRESSOR) tablet 50 mg (has no administration in time range)  magnesium oxide (MAG-OX) tablet 400 mg (has no administration in time range)  cyanocobalamin (VITAMIN B12) tablet 1,000 mcg (has no administration in time range)  psyllium (HYDROCIL/METAMUCIL) 1 packet (has no administration in time range)  pantoprazole (PROTONIX) EC tablet 40 mg (has no administration in time range)  multivitamin with minerals tablet 1 tablet (has no administration in time range)  PreserVision AREDS 2 CAPS (has no administration in time range)  omega-3 acid ethyl esters (LOVAZA) capsule 1 g (has no administration in time range)  loratadine (CLARITIN) tablet 10  mg (has no administration in time range)  azelastine (ASTELIN) 0.1 % nasal spray 2 spray (has no administration in time range)  ipratropium-albuterol (DUONEB) 0.5-2.5 (3) MG/3ML nebulizer solution 3 mL (has no administration in time range)  acetaminophen (TYLENOL) tablet 650 mg (has no administration in time range)    Or  acetaminophen (TYLENOL) suppository 650 mg (has no administration in time range)  traZODone (DESYREL) tablet 25 mg (has no administration in time range)  ondansetron (ZOFRAN) tablet 4 mg (has no administration in time range)    Or  ondansetron (ZOFRAN) injection 4 mg (has no administration in time range)  0.9 %  sodium chloride infusion (has no administration in time range)  guaiFENesin (MUCINEX) 12 hr tablet 600 mg (has no administration in time range)  chlorpheniramine-HYDROcodone (TUSSIONEX) 10-8 MG/5ML suspension 5 mL (has no administration in time range)  cefTRIAXone (ROCEPHIN) 1 g in sodium chloride 0.9 % 100 mL IVPB (has no administration in time range)    Followed by  cefTRIAXone (ROCEPHIN) 2 g in sodium chloride 0.9 % 100 mL IVPB (has no administration in time range)  azithromycin (ZITHROMAX) 500 mg in sodium chloride 0.9 % 250 mL IVPB (has no administration in time range)  sodium chloride 0.9 % bolus 2,000 mL (2,000 mLs Intravenous New Bag/Given 11/09/22 0425)  acetaminophen (TYLENOL) tablet 650 mg (650 mg Oral Given 11/09/22 0423)  ipratropium-albuterol (DUONEB) 0.5-2.5 (3) MG/3ML nebulizer solution 6 mL (6 mLs Nebulization Given 11/09/22 0437)  azithromycin (ZITHROMAX) 500 mg in sodium chloride 0.9 % 250 mL IVPB (500 mg Intravenous New Bag/Given 11/09/22 0429)  cefTRIAXone (ROCEPHIN) 1 g in sodium chloride 0.9 % 100 mL IVPB (0 g Intravenous Stopped 11/09/22 0500)    Consultants:  I spoke with hospitalist for admission and regarding care plan for this patient.   IMPRESSION / MDM / ASSESSMENT AND PLAN / ED COURSE  I reviewed the triage vital signs and the  nursing notes.                              Differential diagnosis includes, but is not limited to, flu , bacterial pneumonia, dehydration electrolyte disturbance, sepsis   The patient is on the cardiac monitor to evaluate for evidence of arrhythmia and/or significant heart rate changes.  MDM: Patient with known influenza and flulike symptoms that has worsened throughout the day with profound weakness, poor p.o. intake, shortness of breath and cough.  He has an opacity to the right lower lobe so we will add on community-acquired pneumonia coverage.  He is tachycardic and tachypneic, sepsis evaluation started with lactic acids and blood cultures were sent, antibiotics directed toward pneumonia as above, and a sepsis bolus 30 cc per kg ideal body weight.  He states he has a history of asthma and has some wheezing so we will give DuoNeb.  He will be admitted to the hospital.  Denies chest pain but he has a new left bundle branch block compared to prior, no Sgarbossa criteria but will add on troponins to trend.  REASSESSMENT: Patient mentation is the same, unchanged, remains tachycardic to the low 100s.  Normotensive.  Patient's presentation is most consistent with acute presentation with potential threat to life or bodily function.       FINAL CLINICAL IMPRESSION(S) / ED DIAGNOSES   Final diagnoses:  Generalized weakness  Influenza  Sepsis, due to unspecified organism, unspecified whether acute organ dysfunction present ([place])  AKI (acute kidney injury) ([place])  Pneumonia of right lung due to infectious organism, unspecified part of lung     Rx / DC Orders   ED Discharge Orders     None        Note:  This document was prepared using Dragon voice recognition software and may include unintentional dictation errors.    Lucillie Garfinkel, MD 11/09/22 0500    Lucillie Garfinkel, MD 11/09/22 Cindie Laroche    Lucillie Garfinkel, MD 11/09/22 [phone]

## 2022-11-09 NOTE — ED Notes (Signed)
Pt HOB raised to eat lunch.

## 2022-11-09 NOTE — ED Notes (Addendum)
Pt assisted up to bedside toilet as requested. Non-slip socks applied to pt's feet. Pt has been on external male cath but has not urinated since this RN received him at 1500.

## 2022-11-09 NOTE — Progress Notes (Signed)
*  PRELIMINARY RESULTS* Echocardiogram 2D Echocardiogram has been performed.  Levi Figueroa [name] 11/09/2022, 1:52 PM

## 2022-11-09 NOTE — ED Notes (Signed)
IV fluid bolus infusing into right AC IV transferred to left forearm IV. Will collect repeat lactic acid following completion of IVFs.

## 2022-11-09 NOTE — ED Notes (Signed)
XR at bedside

## 2022-11-09 NOTE — ED Notes (Signed)
Pt placed on 2L [place] due to work of breathing.

## 2022-11-09 NOTE — Assessment & Plan Note (Signed)
-   We will continue PPI therapy 

## 2022-11-09 NOTE — ED Triage Notes (Signed)
Pt BIB A- EMS from home call out d/t weakness. Pt states unable to get out of bed this morning. EMS arrival found pt 88-90% on room air, was placed on 4 L of oxygen in route. Pt denies CP/SOB. Per note pt was dx with flu yesterday at walk-in clinic.  On arrival to ED pt is 94% on room air, RR 20-30s, labored breathing, ST 105 on the monitor.

## 2022-11-10 ENCOUNTER — Encounter: Payer: Self-pay | Admitting: Family Medicine

## 2022-11-10 DIAGNOSIS — I509 Heart failure, unspecified: Secondary | ICD-10-CM | POA: Insufficient documentation

## 2022-11-10 LAB — URINALYSIS, COMPLETE (UACMP) WITH MICROSCOPIC
Bacteria, UA: NONE SEEN
Bilirubin Urine: NEGATIVE
Glucose, UA: NEGATIVE mg/dL
Hgb urine dipstick: NEGATIVE
Ketones, ur: NEGATIVE mg/dL
Leukocytes,Ua: NEGATIVE
Nitrite: NEGATIVE
Protein, ur: 30 mg/dL — AB
Specific Gravity, Urine: 1.019 (ref 1.005–1.030)
Squamous Epithelial / HPF: NONE SEEN (ref 0–5)
pH: 5 (ref 5.0–8.0)

## 2022-11-10 LAB — LACTIC ACID, PLASMA: Lactic Acid, Venous: 1.4 mmol/L (ref 0.5–1.9)

## 2022-11-10 LAB — BASIC METABOLIC PANEL
Anion gap: 8 (ref 5–15)
BUN: 51 mg/dL — ABNORMAL HIGH (ref 8–23)
CO2: 22 mmol/L (ref 22–32)
Calcium: 8 mg/dL — ABNORMAL LOW (ref 8.9–10.3)
Chloride: 112 mmol/L — ABNORMAL HIGH (ref 98–111)
Creatinine, Ser: 1.76 mg/dL — ABNORMAL HIGH (ref 0.61–1.24)
GFR, Estimated: 37 mL/min — ABNORMAL LOW (ref >60)
Glucose, Bld: 175 mg/dL — ABNORMAL HIGH (ref 70–99)
Potassium: 4.5 mmol/L (ref 3.5–5.1)
Sodium: 142 mmol/L (ref 135–145)

## 2022-11-10 LAB — CBC
HCT: 33.1 % — ABNORMAL LOW (ref 39.0–52.0)
Hemoglobin: 10.4 g/dL — ABNORMAL LOW (ref 13.0–17.0)
MCH: 28.1 pg (ref 26.0–34.0)
MCHC: 31.4 g/dL (ref 30.0–36.0)
MCV: 89.5 fL (ref 80.0–100.0)
Platelets: 41 10*3/uL — ABNORMAL LOW (ref 150–400)
RBC: 3.7 MIL/uL — ABNORMAL LOW (ref 4.22–5.81)
RDW: 15.7 % — ABNORMAL HIGH (ref 11.5–15.5)
WBC: 16.7 10*3/uL — ABNORMAL HIGH (ref 4.0–10.5)
nRBC: 0 % (ref 0.0–0.2)

## 2022-11-10 LAB — TROPONIN I (HIGH SENSITIVITY): Troponin I (High Sensitivity): 425 ng/L (ref <18)

## 2022-11-10 LAB — PROTIME-INR
INR: 1.2 (ref 0.8–1.2)
Prothrombin Time: 15.5 seconds — ABNORMAL HIGH (ref 11.4–15.2)

## 2022-11-10 LAB — CORTISOL-AM, BLOOD: Cortisol - AM: 6.5 ug/dL — ABNORMAL LOW (ref 6.7–22.6)

## 2022-11-10 LAB — PROCALCITONIN: Procalcitonin: 2.6 ng/mL

## 2022-11-10 MED ORDER — IPRATROPIUM-ALBUTEROL 0.5-2.5 (3) MG/3ML IN SOLN
3.0000 mL | Freq: Three times a day (TID) | RESPIRATORY_TRACT | Status: DC
  Administered 2022-11-11 – 2022-11-12 (×5): 3 mL via RESPIRATORY_TRACT
  Filled 2022-11-10 (×5): qty 3

## 2022-11-10 MED ORDER — IPRATROPIUM-ALBUTEROL 0.5-2.5 (3) MG/3ML IN SOLN: 3.0000 mL | Freq: Four times a day (QID) | RESPIRATORY_TRACT | Status: DC | PRN

## 2022-11-10 MED ORDER — STERILE WATER FOR INJECTION IJ SOLN
INTRAMUSCULAR | Status: AC
  Administered 2022-11-10: 10 mL via INTRAVENOUS
  Filled 2022-11-10: qty 10

## 2022-11-10 MED ORDER — METOPROLOL TARTRATE 50 MG PO TABS
75.0000 mg | ORAL_TABLET | Freq: Two times a day (BID) | ORAL | Status: DC
  Administered 2022-11-10 – 2022-11-12 (×5): 75 mg via ORAL
  Filled 2022-11-10 (×5): qty 1

## 2022-11-10 MED ORDER — OCUVITE-LUTEIN PO CAPS
1.0000 | ORAL_CAPSULE | Freq: Two times a day (BID) | ORAL | Status: DC
  Filled 2022-11-10: qty 1

## 2022-11-10 MED ORDER — ISOSORBIDE MONONITRATE ER 30 MG PO TB24
30.0000 mg | ORAL_TABLET | Freq: Every day | ORAL | Status: DC
  Administered 2022-11-10 – 2022-11-12 (×3): 30 mg via ORAL
  Filled 2022-11-10 (×3): qty 1

## 2022-11-10 MED ORDER — LACTATED RINGERS IV SOLN
INTRAVENOUS | Status: AC
  Administered 2022-11-11: 1000 mL via INTRAVENOUS

## 2022-11-10 MED ORDER — METOPROLOL TARTRATE 50 MG PO TABS: 100.0000 mg | ORAL_TABLET | Freq: Two times a day (BID) | ORAL | Status: DC

## 2022-11-10 MED ORDER — STERILE WATER FOR INJECTION IJ SOLN
INTRAMUSCULAR | Status: AC
  Administered 2022-11-10: 10 mL
  Filled 2022-11-10: qty 10

## 2022-11-10 MED ORDER — HYDRALAZINE HCL 50 MG PO TABS
25.0000 mg | ORAL_TABLET | Freq: Two times a day (BID) | ORAL | Status: DC
  Administered 2022-11-10 – 2022-11-12 (×5): 25 mg via ORAL
  Filled 2022-11-10 (×5): qty 1

## 2022-11-10 MED ORDER — PROSIGHT PO TABS
1.0000 | ORAL_TABLET | Freq: Two times a day (BID) | ORAL | Status: DC
  Administered 2022-11-10 – 2022-11-12 (×4): 1 via ORAL
  Filled 2022-11-10 (×4): qty 1

## 2022-11-10 NOTE — Progress Notes (Signed)
PROGRESS NOTE    [name]  GUR:[id number]  DOB: [birth year]  DOA: 11/09/2022 PCP: [name], MD Outpatient Specialists:   Hospital course:  87 year old man with CAD, HFrEF, HTN, MGUS, ITP was admitted yesterday with dyspnea and cough.  Workup in ED reveals CAP with RLL PNA and positive influenza A.  Patient was treated ceftriaxone and azithromycin.  Subjective:  This morning patient seen in conjunction with wife and daughter, patient notes he feels much much better, breathing is much more comfortable.  Wife and daughter agree and states that his collar and general alertness is almost back to baseline but not quite.  Patient denies chest pain.   Objective: Vitals:   11/10/22 0102 11/10/22 0615 11/10/22 0752 11/10/22 0816  BP:  (!) 128/56  (!) 119/52  Pulse:  69  74  Resp:  20  19  Temp:  98.2 F (36.8 C)  98.7 F (37.1 C)  TempSrc:      SpO2: 95% 95% 95% 96%  Weight:      Height:        Intake/Output Summary (Last 24 hours) at 11/10/2022 1007 Last data filed at 11/10/2022 0641 Gross per 24 hour  Intake 300 ml  Output 450 ml  Net -150 ml   Filed Weights   11/09/22 0339 11/09/22 2347  Weight: 83 kg 79.4 kg     Exam:  General: Patient sitting up in bed chatting comfortably with his wife and daughter at bedside Eyes: sclera anicteric, conjuctiva mild injection bilaterally CVS: S1-S2, regular  Respiratory:   Reasonable air entry bilaterally with rales at bases right greater than left GI: NABS, soft, NT  LE: Warm and well-perfused Neuro: A/O x 3,  grossly nonfocal.  Psych: patient is logical and coherent, judgement and insight appear normal, mood and affect appropriate to situation.  Data Reviewed:  Basic Metabolic Panel: Recent Labs  Lab 11/09/22 0411 11/10/22 0509  NA 142 142  K 4.2 4.5  CL 111 112*  CO2 21* 22  GLUCOSE 158* 175*  BUN 36* 51*  CREATININE 1.81* 1.76*  CALCIUM 8.4* 8.0*    CBC: Recent Labs  Lab 11/06/22 0826  11/09/22 0411 11/10/22 0509  WBC 15.1* 26.3* 16.7*  NEUTROABS 12.3* 23.7*  --   HGB 13.3 12.5* 10.4*  HCT 41.1 39.3 33.1*  MCV 88.0 88.5 89.5  PLT 63* 52* 41*     Scheduled Meds:  aspirin EC  81 mg Oral Daily   atorvastatin  80 mg Oral Q1400   cyanocobalamin  1,000 mcg Oral Daily   guaiFENesin  600 mg Oral BID   hydrALAZINE  25 mg Oral BID   ipratropium-albuterol  3 mL Nebulization Q6H   isosorbide mononitrate  30 mg Oral Daily   loratadine  10 mg Oral Daily   magnesium oxide  400 mg Oral Daily   methylPREDNISolone (SOLU-MEDROL) injection  40 mg Intravenous Q12H   metoprolol tartrate  75 mg Oral BID   multivitamin  1 tablet Oral BID   multivitamin with minerals  1 tablet Oral Daily   omega-3 acid ethyl esters  1 g Oral Daily   oseltamivir  30 mg Oral BID   pantoprazole  40 mg Oral Daily   psyllium  1 packet Oral Daily   Continuous Infusions:  azithromycin 500 mg (11/10/22 0629)   cefTRIAXone (ROCEPHIN)  IV 2 g (11/10/22 0847)     Assessment & Plan:   CAP and influenza A Patient is markedly improved overnight  with treatment  Continue ceftriaxone and azithromycin day #2 Patient is on Solu-Medrol 40 every 12 which I will continue for today however would have low threshold for discontinuing given known MGUS. Continue Tamiflu  AKI Persistent, fluid resuscitation has been gentle given known HFrEF see below However BUN is elevated from 30-51 overnight, will start very low-dose LR fluid resuscitation at 50 cc an hour x 1 L today.  Will need to follow lung exam very closely.  Elevated troponin/CAD Appreciate cardiology input, thought to be secondary to demand ischemia, no further workup is necessary.  Patient remains chest pain-free Continue isosorbide  HFrEF Echocardiogram done yesterday shows EF of 30 to 35% global hypokinesis, RV size is "severely enlarged" with moderately reduced function. Patient is going to get 1 L over 20 hours for AKI and rising BUN, will need to  follow l fluid status closely.  Continue low-sodium diet.  HTN  continue hydralazine and metoprolol per home doses  GERD Continue PPI    DVT prophylaxis: Lovenox Code Status: DNR Family Communication: Wife and daughter at bedside throughout    Studies: ECHOCARDIOGRAM COMPLETE  Result Date: 11/09/2022    ECHOCARDIOGRAM REPORT   Patient Name:   Levi Figueroa Date of Exam: 11/09/2022 Medical Rec #:  [id number]      Height:       70.0 in Accession #:    [id number]     Weight:       183.0 lb Date of Birth:  [birth year]     BSA:          2.010 m Patient Age:    63 years       BP:           102/44 mmHg Patient Gender: M              HR:           74 bpm. Exam Location:  [facility] Procedure: 2D Echo, Color Doppler, Cardiac Doppler and Intracardiac            Opacification Agent Indications:     I21.4 NSTEMI  History:         Patient has no prior history of Echocardiogram examinations.                  CAD and Previous Myocardial Infarction, Prior CABG; Risk                  Factors:Hypertension and Dyslipidemia.  Sonographer:     Charmayne Sheer Referring Phys:  [id number] JAN A MANSY Diagnosing Phys: Yolonda Kida MD  Sonographer Comments: Suboptimal apical window. IMPRESSIONS  1. Left ventricular ejection fraction, by estimation, is 30 to 35%. The left ventricle has moderately decreased function. The left ventricle demonstrates global hypokinesis. The left ventricular internal cavity size was moderately to severely dilated. Left ventricular diastolic parameters are consistent with Grade II diastolic dysfunction (pseudonormalization).  2. Right ventricular systolic function is moderately reduced. The right ventricular size is severely enlarged.  3. Left atrial size was mild to moderately dilated.  4. Right atrial size was mild to moderately dilated.  5. The mitral valve is normal in structure. Trivial mitral valve regurgitation.  6. The aortic valve is normal in structure. Aortic valve regurgitation is not  visualized. FINDINGS  Left Ventricle: Left ventricular ejection fraction, by estimation, is 30 to 35%. The left ventricle has moderately decreased function. The left ventricle demonstrates global hypokinesis. Definity contrast agent was given IV to delineate the  left ventricular endocardial borders. The left ventricular internal cavity size was moderately to severely dilated. There is borderline left ventricular hypertrophy. Left ventricular diastolic parameters are consistent with Grade II diastolic dysfunction (pseudonormalization). Right Ventricle: The right ventricular size is severely enlarged. No increase in right ventricular wall thickness. Right ventricular systolic function is moderately reduced. Left Atrium: Left atrial size was mild to moderately dilated. Right Atrium: Right atrial size was mild to moderately dilated. Pericardium: There is no evidence of pericardial effusion. Mitral Valve: The mitral valve is normal in structure. Trivial mitral valve regurgitation. Tricuspid Valve: The tricuspid valve is normal in structure. Tricuspid valve regurgitation is trivial. Aortic Valve: The aortic valve is normal in structure. Aortic valve regurgitation is not visualized. Aortic valve mean gradient measures 6.0 mmHg. Aortic valve peak gradient measures 9.7 mmHg. Aortic valve area, by VTI measures 1.59 cm. Pulmonic Valve: The pulmonic valve was normal in structure. Pulmonic valve regurgitation is not visualized. Aorta: The ascending aorta was not well visualized. IAS/Shunts: No atrial level shunt detected by color flow Doppler.  LEFT VENTRICLE PLAX 2D LVIDd:         5.80 cm      Diastology LVIDs:         4.80 cm      LV e' medial:    5.77 cm/s LV PW:         1.20 cm      LV E/e' medial:  19.8 LV IVS:        0.80 cm      LV e' lateral:   10.40 cm/s LVOT diam:     1.80 cm      LV E/e' lateral: 11.0 LV SV:         46 LV SV Index:   23 LVOT Area:     2.54 cm  LV Volumes (MOD) LV vol d, MOD A2C: 98.5 ml LV vol d,  MOD A4C: 129.0 ml LV vol s, MOD A2C: 39.8 ml LV vol s, MOD A4C: 62.6 ml LV SV MOD A2C:     58.7 ml LV SV MOD A4C:     129.0 ml LV SV MOD BP:      64.0 ml RIGHT VENTRICLE RV Basal diam:  3.30 cm RV Mid diam:    4.40 cm RV S prime:     8.27 cm/s LEFT ATRIUM             Index        RIGHT ATRIUM           Index LA diam:        4.50 cm 2.24 cm/m   RA Area:     14.90 cm LA Vol (A2C):   51.8 ml 25.77 ml/m  RA Volume:   35.00 ml  17.41 ml/m LA Vol (A4C):   50.7 ml 25.22 ml/m LA Biplane Vol: 55.9 ml 27.81 ml/m  AORTIC VALVE                     PULMONIC VALVE AV Area (Vmax):    1.65 cm      PV Vmax:       1.29 m/s AV Area (Vmean):   1.62 cm      PV Vmean:      84.400 cm/s AV Area (VTI):     1.59 cm      PV VTI:        0.247 m AV Vmax:  156.00 cm/s   PV Peak grad:  6.7 mmHg AV Vmean:          116.000 cm/s  PV Mean grad:  3.0 mmHg AV VTI:            0.290 m AV Peak Grad:      9.7 mmHg AV Mean Grad:      6.0 mmHg LVOT Vmax:         101.00 cm/s LVOT Vmean:        73.800 cm/s LVOT VTI:          0.181 m LVOT/AV VTI ratio: 0.62  AORTA Ao Root diam: 3.10 cm MITRAL VALVE                TRICUSPID VALVE MV Area (PHT): 4.33 cm     TR Peak grad:   36.7 mmHg MV Decel Time: 175 msec     TR Vmax:        303.00 cm/s MV E velocity: 114.00 cm/s MV A velocity: 55.10 cm/s   SHUNTS MV E/A ratio:  2.07         Systemic VTI:  0.18 m                             Systemic Diam: 1.80 cm Yolonda Kida MD Electronically signed by Yolonda Kida MD Signature Date/Time: 11/09/2022/2:34:57 PM    Final    DG Chest Port 1 View  Result Date: 11/09/2022 CLINICAL DATA:  Cough and shortness of breath and recent positive flu test, initial encounter EXAM: PORTABLE CHEST 1 VIEW COMPARISON:  09/12/2016 FINDINGS: Cardiac shadow is mildly prominent. Postsurgical changes are noted. Aortic calcifications are seen. Patchy basilar infiltrate is noted on the right. No sizable effusion is seen. IMPRESSION: Patchy right basilar infiltrate.  Electronically Signed   By: Inez Catalina M.D.   On: 11/09/2022 03:48    Principal Problem:   Sepsis due to pneumonia [facility]) Active Problems:   AKI (acute kidney injury) ([place])   Influenza A with pneumonia   Elevated troponin I level   Essential hypertension   GERD without esophagitis   Vitamin B12 deficiency     Maebelle Sulton Tublu Yashica Sterbenz, [facility]  If 7PM-7AM, please contact night-coverage [email or web address]   LOS: 1 day

## 2022-11-10 NOTE — Progress Notes (Signed)
[facility]    SUBJECTIVE: Patient complaining of cough congestion shortness of breath slightly improved but still having significant symptoms breathing somewhat improved on less oxygen no sputum production,   Vitals:   11/09/22 2316 11/09/22 2347 11/10/22 0102 11/10/22 0615  BP: (!) 130/54   (!) 128/56  Pulse: 87   69  Resp: 20   20  Temp:  98 F (36.7 C)  98.2 F (36.8 C)  TempSrc:  Axillary    SpO2: 97%  95% 95%  Weight:  79.4 kg    Height:  '5\' 10"'$  (1.778 m)       Intake/Output Summary (Last 24 hours) at 11/10/2022 0724 Last data filed at 11/10/2022 0641 Gross per 24 hour  Intake 240 ml  Output 450 ml  Net -210 ml      PHYSICAL EXAM  General: Well developed, well nourished, in no acute distress HEENT:  Normocephalic and atramatic Neck:  No JVD.  Lungs: Clear bilaterally to auscultation and percussion. Heart: HRRR . Normal S1 and S2 without gallops or murmurs.  Abdomen: Bowel sounds are positive, abdomen soft and non-tender  Msk:  Back normal, normal gait. Normal strength and tone for age. Extremities: No clubbing, cyanosis or edema.   Neuro: Alert and oriented X 3. Psych:  Good affect, responds appropriately   LABS: Basic Metabolic Panel: Recent Labs    11/09/22 0411 11/10/22 0509  NA 142 142  K 4.2 4.5  CL 111 112*  CO2 21* 22  GLUCOSE 158* 175*  BUN 36* 51*  CREATININE 1.81* 1.76*  CALCIUM 8.4* 8.0*   Liver Function Tests: Recent Labs    11/09/22 0411  AST 29  ALT 21  ALKPHOS 53  BILITOT 0.9  PROT 7.1  ALBUMIN 3.7   No results for input(s): "LIPASE", "AMYLASE" in the last 72 hours. CBC: Recent Labs    11/09/22 0411 11/10/22 0509  WBC 26.3* 16.7*  NEUTROABS 23.7*  --   HGB 12.5* 10.4*  HCT 39.3 33.1*  MCV 88.5 89.5  PLT 52* 41*   Cardiac Enzymes: No results for input(s): "CKTOTAL", "CKMB", "CKMBINDEX", "TROPONINI" in the last 72 hours. BNP: Invalid input(s): "POCBNP" D-Dimer: No results for input(s): "DDIMER" in the last  72 hours. Hemoglobin A1C: No results for input(s): "HGBA1C" in the last 72 hours. Fasting Lipid Panel: No results for input(s): "CHOL", "HDL", "LDLCALC", "TRIG", "CHOLHDL", "LDLDIRECT" in the last 72 hours. Thyroid Function Tests: No results for input(s): "TSH", "T4TOTAL", "T3FREE", "THYROIDAB" in the last 72 hours.  Invalid input(s): "FREET3" Anemia Panel: No results for input(s): "VITAMINB12", "FOLATE", "FERRITIN", "TIBC", "IRON", "RETICCTPCT" in the last 72 hours.  ECHOCARDIOGRAM COMPLETE  Result Date: 11/09/2022    ECHOCARDIOGRAM REPORT   Patient Name:   Levi Figueroa Date of Exam: 11/09/2022 Medical Rec #:  [id number]      Height:       70.0 in Accession #:    [id number]     Weight:       183.0 lb Date of Birth:  Nov 21, 1935     BSA:          2.010 m Patient Age:    87 years       BP:           102/44 mmHg Patient Gender: M              HR:           74 bpm. Exam Location:  [facility] Procedure: 2D Echo, Color Doppler, Cardiac  Doppler and Intracardiac            Opacification Agent Indications:     I21.4 NSTEMI  History:         Patient has no prior history of Echocardiogram examinations.                  CAD and Previous Myocardial Infarction, Prior CABG; Risk                  Factors:Hypertension and Dyslipidemia.  Sonographer:     Charmayne Sheer Referring Phys:  [id number] JAN A MANSY Diagnosing Phys: Yolonda Kida MD  Sonographer Comments: Suboptimal apical window. IMPRESSIONS  1. Left ventricular ejection fraction, by estimation, is 30 to 35%. The left ventricle has moderately decreased function. The left ventricle demonstrates global hypokinesis. The left ventricular internal cavity size was moderately to severely dilated. Left ventricular diastolic parameters are consistent with Grade II diastolic dysfunction (pseudonormalization).  2. Right ventricular systolic function is moderately reduced. The right ventricular size is severely enlarged.  3. Left atrial size was mild to moderately dilated.  4.  Right atrial size was mild to moderately dilated.  5. The mitral valve is normal in structure. Trivial mitral valve regurgitation.  6. The aortic valve is normal in structure. Aortic valve regurgitation is not visualized. FINDINGS  Left Ventricle: Left ventricular ejection fraction, by estimation, is 30 to 35%. The left ventricle has moderately decreased function. The left ventricle demonstrates global hypokinesis. Definity contrast agent was given IV to delineate the left ventricular endocardial borders. The left ventricular internal cavity size was moderately to severely dilated. There is borderline left ventricular hypertrophy. Left ventricular diastolic parameters are consistent with Grade II diastolic dysfunction (pseudonormalization). Right Ventricle: The right ventricular size is severely enlarged. No increase in right ventricular wall thickness. Right ventricular systolic function is moderately reduced. Left Atrium: Left atrial size was mild to moderately dilated. Right Atrium: Right atrial size was mild to moderately dilated. Pericardium: There is no evidence of pericardial effusion. Mitral Valve: The mitral valve is normal in structure. Trivial mitral valve regurgitation. Tricuspid Valve: The tricuspid valve is normal in structure. Tricuspid valve regurgitation is trivial. Aortic Valve: The aortic valve is normal in structure. Aortic valve regurgitation is not visualized. Aortic valve mean gradient measures 6.0 mmHg. Aortic valve peak gradient measures 9.7 mmHg. Aortic valve area, by VTI measures 1.59 cm. Pulmonic Valve: The pulmonic valve was normal in structure. Pulmonic valve regurgitation is not visualized. Aorta: The ascending aorta was not well visualized. IAS/Shunts: No atrial level shunt detected by color flow Doppler.  LEFT VENTRICLE PLAX 2D LVIDd:         5.80 cm      Diastology LVIDs:         4.80 cm      LV e' medial:    5.77 cm/s LV PW:         1.20 cm      LV E/e' medial:  19.8 LV IVS:         0.80 cm      LV e' lateral:   10.40 cm/s LVOT diam:     1.80 cm      LV E/e' lateral: 11.0 LV SV:         46 LV SV Index:   23 LVOT Area:     2.54 cm  LV Volumes (MOD) LV vol d, MOD A2C: 98.5 ml LV vol d, MOD A4C: 129.0 ml LV vol s, MOD  A2C: 39.8 ml LV vol s, MOD A4C: 62.6 ml LV SV MOD A2C:     58.7 ml LV SV MOD A4C:     129.0 ml LV SV MOD BP:      64.0 ml RIGHT VENTRICLE RV Basal diam:  3.30 cm RV Mid diam:    4.40 cm RV S prime:     8.27 cm/s LEFT ATRIUM             Index        RIGHT ATRIUM           Index LA diam:        4.50 cm 2.24 cm/m   RA Area:     14.90 cm LA Vol (A2C):   51.8 ml 25.77 ml/m  RA Volume:   35.00 ml  17.41 ml/m LA Vol (A4C):   50.7 ml 25.22 ml/m LA Biplane Vol: 55.9 ml 27.81 ml/m  AORTIC VALVE                     PULMONIC VALVE AV Area (Vmax):    1.65 cm      PV Vmax:       1.29 m/s AV Area (Vmean):   1.62 cm      PV Vmean:      84.400 cm/s AV Area (VTI):     1.59 cm      PV VTI:        0.247 m AV Vmax:           156.00 cm/s   PV Peak grad:  6.7 mmHg AV Vmean:          116.000 cm/s  PV Mean grad:  3.0 mmHg AV VTI:            0.290 m AV Peak Grad:      9.7 mmHg AV Mean Grad:      6.0 mmHg LVOT Vmax:         101.00 cm/s LVOT Vmean:        73.800 cm/s LVOT VTI:          0.181 m LVOT/AV VTI ratio: 0.62  AORTA Ao Root diam: 3.10 cm MITRAL VALVE                TRICUSPID VALVE MV Area (PHT): 4.33 cm     TR Peak grad:   36.7 mmHg MV Decel Time: 175 msec     TR Vmax:        303.00 cm/s MV E velocity: 114.00 cm/s MV A velocity: 55.10 cm/s   SHUNTS MV E/A ratio:  2.07         Systemic VTI:  0.18 m                             Systemic Diam: 1.80 cm Yolonda Kida MD Electronically signed by Yolonda Kida MD Signature Date/Time: 11/09/2022/2:34:57 PM    Final    DG Chest Port 1 View  Result Date: 11/09/2022 CLINICAL DATA:  Cough and shortness of breath and recent positive flu test, initial encounter EXAM: PORTABLE CHEST 1 VIEW COMPARISON:  09/12/2016 FINDINGS: Cardiac shadow is  mildly prominent. Postsurgical changes are noted. Aortic calcifications are seen. Patchy basilar infiltrate is noted on the right. No sizable effusion is seen. IMPRESSION: Patchy right basilar infiltrate. Electronically Signed   By: Inez Catalina M.D.   On: 11/09/2022 03:48  Echo Mod reduced LVF EF=30-35%   TELEMETRY: Normal sinus rhythm rate around 75 nonspecific EKG changes:  ASSESSMENT AND PLAN:  Principal Problem:   Sepsis due to pneumonia [facility]) Active Problems:   AKI (acute kidney injury) ([place])   Influenza A with pneumonia   GERD without esophagitis   Vitamin B12 deficiency   Essential hypertension   Elevated troponin I level    Plan Acute viral pneumonia probably related to influenza patient much improvement shortness of breath less dyspnea resting comfortably continue supportive management Agree with current treatment with antibiotic therapy with Zithromax for pneumonia Moderate cardiomyopathy EF around 30 to 35% will continue current therapy metoprolol we will consider switching from lisinopril to hydralazine Imdur defer spironolactone for now because of renal insufficiency Consider adding Farxiga if renal function improves to help with heart failure cardiomyopathy management Atrial fibrillation reasonably controlled at this stage poor anticoagulation candidate because of thrombocytopenia  Consider transitioning from diltiazem to just metoprolol for rate control because of his cardiomyopathy Multivessel coronary disease coronary bypass surgery in the past no recent anginal type symptoms continue conservative management Hyper tension reasonably manage continue current care With troponin consistent with demand ischemia versus non-STEMI recommend conservative care Acute on chronic renal sufficiency recommend maintain adequate hydration No invasive cardiac procedures planned at this stage    Yolonda Kida, MD 11/10/2022 7:24 AM

## 2022-11-11 DIAGNOSIS — J189 Pneumonia, unspecified organism: Secondary | ICD-10-CM | POA: Diagnosis not present

## 2022-11-11 DIAGNOSIS — A419 Sepsis, unspecified organism: Secondary | ICD-10-CM | POA: Diagnosis not present

## 2022-11-11 LAB — CBC WITH DIFFERENTIAL/PLATELET
Abs Immature Granulocytes: 0.54 10*3/uL — ABNORMAL HIGH (ref 0.00–0.07)
Basophils Absolute: 0 10*3/uL (ref 0.0–0.1)
Basophils Relative: 0 %
Eosinophils Absolute: 0 10*3/uL (ref 0.0–0.5)
Eosinophils Relative: 0 %
HCT: 34.1 % — ABNORMAL LOW (ref 39.0–52.0)
Hemoglobin: 10.9 g/dL — ABNORMAL LOW (ref 13.0–17.0)
Immature Granulocytes: 3 %
Lymphocytes Relative: 2 %
Lymphs Abs: 0.5 10*3/uL — ABNORMAL LOW (ref 0.7–4.0)
MCH: 27.8 pg (ref 26.0–34.0)
MCHC: 32 g/dL (ref 30.0–36.0)
MCV: 87 fL (ref 80.0–100.0)
Monocytes Absolute: 0.6 10*3/uL (ref 0.1–1.0)
Monocytes Relative: 3 %
Neutro Abs: 20.1 10*3/uL — ABNORMAL HIGH (ref 1.7–7.7)
Neutrophils Relative %: 92 %
Platelets: 58 10*3/uL — ABNORMAL LOW (ref 150–400)
RBC: 3.92 MIL/uL — ABNORMAL LOW (ref 4.22–5.81)
RDW: 15.6 % — ABNORMAL HIGH (ref 11.5–15.5)
WBC: 21.8 10*3/uL — ABNORMAL HIGH (ref 4.0–10.5)
nRBC: 0.1 % (ref 0.0–0.2)

## 2022-11-11 LAB — BASIC METABOLIC PANEL
Anion gap: 5 (ref 5–15)
BUN: 52 mg/dL — ABNORMAL HIGH (ref 8–23)
CO2: 20 mmol/L — ABNORMAL LOW (ref 22–32)
Calcium: 8.4 mg/dL — ABNORMAL LOW (ref 8.9–10.3)
Chloride: 116 mmol/L — ABNORMAL HIGH (ref 98–111)
Creatinine, Ser: 1.36 mg/dL — ABNORMAL HIGH (ref 0.61–1.24)
GFR, Estimated: 51 mL/min — ABNORMAL LOW (ref >60)
Glucose, Bld: 155 mg/dL — ABNORMAL HIGH (ref 70–99)
Potassium: 4.4 mmol/L (ref 3.5–5.1)
Sodium: 141 mmol/L (ref 135–145)

## 2022-11-11 NOTE — Progress Notes (Signed)
PROGRESS NOTE    Levi Figueroa   CHY:[id number] DOB: 1935-03-10  DOA: 11/09/2022 Date of Service: 11/11/22 PCP: [name], MD     Brief Narrative / Hospital Course:  87 year old man with CAD, HFrEF, HTN, MGUS, ITP was admitted 12/14 with dyspnea and cough. Workup in ED revealed CAP with RLL PNA and positive influenza A. Patient was treated w/ Tamiflu, ceftriaxone and azithromycin. Improving but weak. PT ordered 12/16 but unable to see him d/t weekend staffing issues.   Consultants:  Cardiology   Procedures: none        ASSESSMENT & PLAN:   Principal Problem:   Sepsis due to pneumonia [facility]) Active Problems:   AKI (acute kidney injury) (Levi Figueroa)   Influenza A with pneumonia   Elevated troponin I level   Essential hypertension   GERD without esophagitis   Vitamin B12 deficiency  CAP and influenza A Patient is markedly improved with treatment  Continue ceftriaxone and azithromycin day #3 Patient is on Solu-Medrol 40 every 12 which was d/c today  Continue Tamiflu   AKI - improving fluid resuscitation has been gentle given known HFrEF see below   Elevated troponin/CAD Appreciate cardiology input, thought to be secondary to demand ischemia, no further workup is necessary.  Patient remains chest pain-free Continue isosorbide   HFrEF Echocardiogram done this admission shows EF of 30 to 35% global hypokinesis, RV size is "severely enlarged" with moderately reduced function. Patient is going to get 1 L over 20 hours for AKI and rising BUN, will need to follow fluid status closely. Continue low-sodium diet.   HTN  continue hydralazine and metoprolol per home doses   GERD Continue PPI      DVT prophylaxis: SCD given low Plt Pertinent IV fluids/nutrition: no continuous IV fluids Central lines / invasive devices: none  Code Status: DNR Family Communication: wife and daughter at bedside on rounds  Disposition: inpatietn TOC needs: possible HH/SNF await  PT Barriers to discharge / significant pending items: await PT continue above treatment IV             Subjective:  Patient reports feeling better today but weak and still mild SOB w/ activity Denies CP/palpitations.  Pain controlled.  Denies new weakness.  Tolerating diet.  Reports no concerns w/ urination/defecation.     Objective Findings:  Vitals:   11/11/22 1035 11/11/22 1311 11/11/22 1352 11/11/22 1623  BP: 127/64 (!) 120/54  138/61  Pulse: 72 74  69  Resp:  17  17  Temp:  97.6 F (36.4 C)  98.7 F (37.1 C)  TempSrc:    Oral  SpO2:  97% 96% 97%  Weight:      Height:        Intake/Output Summary (Last 24 hours) at 11/11/2022 1638 Last data filed at 11/11/2022 1500 Gross per 24 hour  Intake 1248.46 ml  Output 150 ml  Net 1098.46 ml   Filed Weights   11/09/22 0339 11/09/22 2347  Weight: 83 kg 79.4 kg    Examination:  Constitutional:  VS as above General Appearance: alert, well-developed, well-nourished, NAD Respiratory: Normal respiratory effort No wheeze No rhonchi No rales Cardiovascular: S1/S2 normal No murmur RRR No rub/gallop auscultated Trace lower extremity edema Gastrointestinal: No tenderness Musculoskeletal:  Symmetrical movement in all extremities Neurological: No cranial nerve deficit on limited exam Alert Psychiatric: Normal judgment/insight Normal mood and affect       Scheduled Medications:   aspirin EC  81 mg Oral Daily  atorvastatin  80 mg Oral Q1400   cyanocobalamin  1,000 mcg Oral Daily   guaiFENesin  600 mg Oral BID   hydrALAZINE  25 mg Oral BID   ipratropium-albuterol  3 mL Nebulization TID   isosorbide mononitrate  30 mg Oral Daily   loratadine  10 mg Oral Daily   magnesium oxide  400 mg Oral Daily   metoprolol tartrate  75 mg Oral BID   multivitamin  1 tablet Oral BID   multivitamin with minerals  1 tablet Oral Daily   omega-3 acid ethyl esters  1 g Oral Daily   oseltamivir  30 mg Oral BID    pantoprazole  40 mg Oral Daily   psyllium  1 packet Oral Daily    Continuous Infusions:  azithromycin 500 mg (11/11/22 0552)   cefTRIAXone (ROCEPHIN)  IV 2 g (11/11/22 0952)    PRN Medications:  acetaminophen **OR** acetaminophen, azelastine, chlorpheniramine-HYDROcodone, ipratropium-albuterol, morphine injection, nitroGLYCERIN, ondansetron **OR** ondansetron (ZOFRAN) IV, traZODone  Antimicrobials:  Anti-infectives (From admission, onward)    Start     Dose/Rate Route Frequency Ordered Stop   11/10/22 0800  cefTRIAXone (ROCEPHIN) 2 g in sodium chloride 0.9 % 100 mL IVPB       See Hyperspace for full Linked Orders Report.   2 g 200 mL/hr over 30 Minutes Intravenous Every 24 hours 11/09/22 0543 11/14/22 0759   11/10/22 0600  azithromycin (ZITHROMAX) 500 mg in sodium chloride 0.9 % 250 mL IVPB        500 mg 250 mL/hr over 60 Minutes Intravenous Every 24 hours 11/09/22 0543 11/14/22 0559   11/09/22 2200  oseltamivir (TAMIFLU) capsule 30 mg       See Hyperspace for full Linked Orders Report.   30 mg Oral 2 times daily 11/09/22 0658 11/14/22 0959   11/09/22 0800  cefTRIAXone (ROCEPHIN) 1 g in sodium chloride 0.9 % 100 mL IVPB       See Hyperspace for full Linked Orders Report.   1 g 200 mL/hr over 30 Minutes Intravenous  Once 11/09/22 0543 11/09/22 0830   11/09/22 0700  oseltamivir (TAMIFLU) capsule 75 mg  Status:  Discontinued        75 mg Oral 2 times daily 11/09/22 0651 11/09/22 0657   11/09/22 0700  oseltamivir (TAMIFLU) capsule 75 mg       See Hyperspace for full Linked Orders Report.   75 mg Oral  Once 11/09/22 0658 11/09/22 0938   11/09/22 0545  levofloxacin (LEVAQUIN) IVPB 750 mg  Status:  Discontinued        750 mg 100 mL/hr over 90 Minutes Intravenous  Once 11/09/22 0533 11/09/22 0543   11/09/22 0400  azithromycin (ZITHROMAX) 500 mg in sodium chloride 0.9 % 250 mL IVPB        500 mg 250 mL/hr over 60 Minutes Intravenous  Once 11/09/22 0347 11/09/22 0556   11/09/22 0400   cefTRIAXone (ROCEPHIN) 1 g in sodium chloride 0.9 % 100 mL IVPB        1 g 200 mL/hr over 30 Minutes Intravenous  Once 11/09/22 0347 11/09/22 0500           Data Reviewed: I have personally reviewed following labs and imaging studies  CBC: Recent Labs  Lab 11/06/22 0826 11/09/22 0411 11/10/22 0509 11/11/22 0509  WBC 15.1* 26.3* 16.7* 21.8*  NEUTROABS 12.3* 23.7*  --  20.1*  HGB 13.3 12.5* 10.4* 10.9*  HCT 41.1 39.3 33.1* 34.1*  MCV 88.0 88.5 89.5 87.0  PLT 63* 52* 41* 58*   Basic Metabolic Panel: Recent Labs  Lab 11/09/22 0411 11/10/22 0509 11/11/22 0509  NA 142 142 141  K 4.2 4.5 4.4  CL 111 112* 116*  CO2 21* 22 20*  GLUCOSE 158* 175* 155*  BUN 36* 51* 52*  CREATININE 1.81* 1.76* 1.36*  CALCIUM 8.4* 8.0* 8.4*   GFR: Estimated Creatinine Clearance: 41 mL/min (A) (by C-G formula based on SCr of 1.36 mg/dL (H)). Liver Function Tests: Recent Labs  Lab 11/09/22 0411  AST 29  ALT 21  ALKPHOS 53  BILITOT 0.9  PROT 7.1  ALBUMIN 3.7   No results for input(s): "LIPASE", "AMYLASE" in the last 168 hours. No results for input(s): "AMMONIA" in the last 168 hours. Coagulation Profile: Recent Labs  Lab 11/10/22 0509  INR 1.2   Cardiac Enzymes: No results for input(s): "CKTOTAL", "CKMB", "CKMBINDEX", "TROPONINI" in the last 168 hours. BNP (last 3 results) No results for input(s): "PROBNP" in the last 8760 hours. HbA1C: No results for input(s): "HGBA1C" in the last 72 hours. CBG: No results for input(s): "GLUCAP" in the last 168 hours. Lipid Profile: No results for input(s): "CHOL", "HDL", "LDLCALC", "TRIG", "CHOLHDL", "LDLDIRECT" in the last 72 hours. Thyroid Function Tests: No results for input(s): "TSH", "T4TOTAL", "FREET4", "T3FREE", "THYROIDAB" in the last 72 hours. Anemia Panel: No results for input(s): "VITAMINB12", "FOLATE", "FERRITIN", "TIBC", "IRON", "RETICCTPCT" in the last 72 hours. Most Recent Urinalysis On File:     Component Value  Date/Time   COLORURINE YELLOW (A) 11/10/2022 0754   APPEARANCEUR HAZY (A) 11/10/2022 0754   APPEARANCEUR Cloudy (A) 03/14/2018 1618   LABSPEC 1.019 11/10/2022 0754   PHURINE 5.0 11/10/2022 0754   GLUCOSEU NEGATIVE 11/10/2022 0754   HGBUR NEGATIVE 11/10/2022 0754   BILIRUBINUR NEGATIVE 11/10/2022 0754   BILIRUBINUR Negative 03/14/2018 [place] 11/10/2022 0754   PROTEINUR 30 (A) 11/10/2022 0754   NITRITE NEGATIVE 11/10/2022 0754   LEUKOCYTESUR NEGATIVE 11/10/2022 0754   Sepsis Labs: '@LABRCNTIP'$ (procalcitonin:4,lacticidven:4)  Recent Results (from the past 240 hour(s))  Blood Culture (routine x 2)     Status: None (Preliminary result)   Collection Time: 11/09/22  4:11 AM   Specimen: BLOOD  Result Value Ref Range Status   Specimen Description BLOOD BLOOD RIGHT ARM  Final   Special Requests   Final    BOTTLES DRAWN AEROBIC AND ANAEROBIC Blood Culture results may not be optimal due to an excessive volume of blood received in culture bottles   Culture   Final    NO GROWTH 2 DAYS Performed at [facility], [address] [place], [place] [address]    Report Status PENDING  Incomplete  Blood Culture (routine x 2)     Status: None (Preliminary result)   Collection Time: 11/09/22  4:11 AM   Specimen: BLOOD  Result Value Ref Range Status   Specimen Description BLOOD BLOOD RIGHT ARM  Final   Special Requests   Final    BOTTLES DRAWN AEROBIC AND ANAEROBIC Blood Culture results may not be optimal due to an excessive volume of blood received in culture bottles   Culture   Final    NO GROWTH 2 DAYS Performed at [facility], [address] [place], [place] [address]    Report Status PENDING  Incomplete  Resp panel by RT-PCR (RSV, Flu A&B, Covid) Anterior Nasal Swab     Status: Abnormal   Collection Time: 11/09/22  4:11 AM   Specimen: Anterior Nasal Swab  Result Value Ref Range Status   SARS Coronavirus 2 by RT PCR NEGATIVE NEGATIVE Final     Comment: (NOTE) SARS-CoV-2 target nucleic acids are NOT DETECTED.  The SARS-CoV-2 RNA is generally detectable in upper respiratory specimens during the acute phase of infection. The lowest concentration of SARS-CoV-2 viral copies this assay can detect is 138 copies/mL. A negative result does not preclude SARS-Cov-2 infection and should not be used as the sole basis for treatment or other patient management decisions. A negative result may occur with  improper specimen collection/handling, submission of specimen other than nasopharyngeal swab, presence of viral mutation(s) within the areas targeted by this assay, and inadequate number of viral copies(<138 copies/mL). A negative result must be combined with clinical observations, patient history, and epidemiological information. The expected result is Negative.  Fact Sheet for Patients:  EntrepreneurPulse.com.au  Fact Sheet for Healthcare Providers:  IncredibleEmployment.be  This test is no t yet approved or cleared by the [place] FDA and  has been authorized for detection and/or diagnosis of SARS-CoV-2 by FDA under an Emergency Use Authorization (EUA). This EUA will remain  in effect (meaning this test can be used) for the duration of the COVID-19 declaration under Section 564(b)(1) of the Act, 21 U.S.C.section 360bbb-3(b)(1), unless the authorization is terminated  or revoked sooner.       Influenza A by PCR POSITIVE (A) NEGATIVE Final   Influenza B by PCR NEGATIVE NEGATIVE Final    Comment: (NOTE) The Xpert Xpress SARS-CoV-2/FLU/RSV plus assay is intended as an aid in the diagnosis of influenza from Nasopharyngeal swab specimens and should not be used as a sole basis for treatment. Nasal washings and aspirates are unacceptable for Xpert Xpress SARS-CoV-2/FLU/RSV testing.  Fact Sheet for Patients: EntrepreneurPulse.com.au  Fact Sheet for Healthcare  Providers: IncredibleEmployment.be  This test is not yet approved or cleared by the [place] FDA and has been authorized for detection and/or diagnosis of SARS-CoV-2 by FDA under an Emergency Use Authorization (EUA). This EUA will remain in effect (meaning this test can be used) for the duration of the COVID-19 declaration under Section 564(b)(1) of the Act, 21 U.S.C. section 360bbb-3(b)(1), unless the authorization is terminated or revoked.     Resp Syncytial Virus by PCR NEGATIVE NEGATIVE Final    Comment: (NOTE) Fact Sheet for Patients: EntrepreneurPulse.com.au  Fact Sheet for Healthcare Providers: IncredibleEmployment.be  This test is not yet approved or cleared by the [place] FDA and has been authorized for detection and/or diagnosis of SARS-CoV-2 by FDA under an Emergency Use Authorization (EUA). This EUA will remain in effect (meaning this test can be used) for the duration of the COVID-19 declaration under Section 564(b)(1) of the Act, 21 U.S.C. section 360bbb-3(b)(1), unless the authorization is terminated or revoked.  Performed at [facility], [address], [place], [place] [address]          Radiology Studies: ECHOCARDIOGRAM COMPLETE  Result Date: 11/09/2022    ECHOCARDIOGRAM REPORT   Patient Name:   Levi Figueroa Date of Exam: 11/09/2022 Medical Rec #:  [id number]      Height:       70.0 in Accession #:    [id number]     Weight:       183.0 lb Date of Birth:  1935/10/29     BSA:          2.010 m Patient Age:    51 years       BP:  102/44 mmHg Patient Gender: M              HR:           74 bpm. Exam Location:  [facility] Procedure: 2D Echo, Color Doppler, Cardiac Doppler and Intracardiac            Opacification Agent Indications:     I21.4 NSTEMI  History:         Patient has no prior history of Echocardiogram examinations.                  CAD and Previous Myocardial Infarction,  Prior CABG; Risk                  Factors:Hypertension and Dyslipidemia.  Sonographer:     Charmayne Sheer Referring Phys:  [id number] JAN A MANSY Diagnosing Phys: Yolonda Kida MD  Sonographer Comments: Suboptimal apical window. IMPRESSIONS  1. Left ventricular ejection fraction, by estimation, is 30 to 35%. The left ventricle has moderately decreased function. The left ventricle demonstrates global hypokinesis. The left ventricular internal cavity size was moderately to severely dilated. Left ventricular diastolic parameters are consistent with Grade II diastolic dysfunction (pseudonormalization).  2. Right ventricular systolic function is moderately reduced. The right ventricular size is severely enlarged.  3. Left atrial size was mild to moderately dilated.  4. Right atrial size was mild to moderately dilated.  5. The mitral valve is normal in structure. Trivial mitral valve regurgitation.  6. The aortic valve is normal in structure. Aortic valve regurgitation is not visualized. FINDINGS  Left Ventricle: Left ventricular ejection fraction, by estimation, is 30 to 35%. The left ventricle has moderately decreased function. The left ventricle demonstrates global hypokinesis. Definity contrast agent was given IV to delineate the left ventricular endocardial borders. The left ventricular internal cavity size was moderately to severely dilated. There is borderline left ventricular hypertrophy. Left ventricular diastolic parameters are consistent with Grade II diastolic dysfunction (pseudonormalization). Right Ventricle: The right ventricular size is severely enlarged. No increase in right ventricular wall thickness. Right ventricular systolic function is moderately reduced. Left Atrium: Left atrial size was mild to moderately dilated. Right Atrium: Right atrial size was mild to moderately dilated. Pericardium: There is no evidence of pericardial effusion. Mitral Valve: The mitral valve is normal in structure. Trivial  mitral valve regurgitation. Tricuspid Valve: The tricuspid valve is normal in structure. Tricuspid valve regurgitation is trivial. Aortic Valve: The aortic valve is normal in structure. Aortic valve regurgitation is not visualized. Aortic valve mean gradient measures 6.0 mmHg. Aortic valve peak gradient measures 9.7 mmHg. Aortic valve area, by VTI measures 1.59 cm. Pulmonic Valve: The pulmonic valve was normal in structure. Pulmonic valve regurgitation is not visualized. Aorta: The ascending aorta was not well visualized. IAS/Shunts: No atrial level shunt detected by color flow Doppler.  LEFT VENTRICLE PLAX 2D LVIDd:         5.80 cm      Diastology LVIDs:         4.80 cm      LV e' medial:    5.77 cm/s LV PW:         1.20 cm      LV E/e' medial:  19.8 LV IVS:        0.80 cm      LV e' lateral:   10.40 cm/s LVOT diam:     1.80 cm      LV E/e' lateral: 11.0 LV SV:  46 LV SV Index:   23 LVOT Area:     2.54 cm  LV Volumes (MOD) LV vol d, MOD A2C: 98.5 ml LV vol d, MOD A4C: 129.0 ml LV vol s, MOD A2C: 39.8 ml LV vol s, MOD A4C: 62.6 ml LV SV MOD A2C:     58.7 ml LV SV MOD A4C:     129.0 ml LV SV MOD BP:      64.0 ml RIGHT VENTRICLE RV Basal diam:  3.30 cm RV Mid diam:    4.40 cm RV S prime:     8.27 cm/s LEFT ATRIUM             Index        RIGHT ATRIUM           Index LA diam:        4.50 cm 2.24 cm/m   RA Area:     14.90 cm LA Vol (A2C):   51.8 ml 25.77 ml/m  RA Volume:   35.00 ml  17.41 ml/m LA Vol (A4C):   50.7 ml 25.22 ml/m LA Biplane Vol: 55.9 ml 27.81 ml/m  AORTIC VALVE                     PULMONIC VALVE AV Area (Vmax):    1.65 cm      PV Vmax:       1.29 m/s AV Area (Vmean):   1.62 cm      PV Vmean:      84.400 cm/s AV Area (VTI):     1.59 cm      PV VTI:        0.247 m AV Vmax:           156.00 cm/s   PV Peak grad:  6.7 mmHg AV Vmean:          116.000 cm/s  PV Mean grad:  3.0 mmHg AV VTI:            0.290 m AV Peak Grad:      9.7 mmHg AV Mean Grad:      6.0 mmHg LVOT Vmax:         101.00 cm/s  LVOT Vmean:        73.800 cm/s LVOT VTI:          0.181 m LVOT/AV VTI ratio: 0.62  AORTA Ao Root diam: 3.10 cm MITRAL VALVE                TRICUSPID VALVE MV Area (PHT): 4.33 cm     TR Peak grad:   36.7 mmHg MV Decel Time: 175 msec     TR Vmax:        303.00 cm/s MV E velocity: 114.00 cm/s MV A velocity: 55.10 cm/s   SHUNTS MV E/A ratio:  2.07         Systemic VTI:  0.18 m                             Systemic Diam: 1.80 cm Yolonda Kida MD Electronically signed by Yolonda Kida MD Signature Date/Time: 11/09/2022/2:34:57 PM    Final    DG Chest Port 1 View  Result Date: 11/09/2022 CLINICAL DATA:  Cough and shortness of breath and recent positive flu test, initial encounter EXAM: PORTABLE CHEST 1 VIEW COMPARISON:  09/12/2016 FINDINGS: Cardiac shadow is mildly prominent. Postsurgical changes are noted. Aortic calcifications are  seen. Patchy basilar infiltrate is noted on the right. No sizable effusion is seen. IMPRESSION: Patchy right basilar infiltrate. Electronically Signed   By: Inez Catalina M.D.   On: 11/09/2022 03:48            LOS: 2 days      Emeterio Reeve, DO [facility] 11/11/2022, 4:38 PM    Dictation software may have been used to generate the above note. Typos may occur and escape review in typed/dictated notes. Please contact Dr Sheppard Coil directly for clarity if needed.  Staff may message me via secure chat in [place]  but this may not receive an immediate response,  please page me for urgent matters!  If 7PM-7AM, please contact night coverage [email or web address]

## 2022-11-11 NOTE — Hospital Course (Addendum)
87 year old man with CAD, HFrEF, HTN, MGUS, ITP was admitted 12/14 with dyspnea and cough. Workup in ED revealed CAP with RLL PNA and positive influenza A. Patient was treated w/ Tamiflu, ceftriaxone and azithromycin. Improving but weak. PT ordered 12/16 but unable to see him d/t weekend staffing issues. Continued to improve into 12/17 and ambulating off supplemental O2, ok for discharge home on po medications w/ follow up PCP and cardiology   Consultants:  Cardiology   Procedures: none        ASSESSMENT & PLAN:   Principal Problem:   Sepsis due to pneumonia [facility]) Active Problems:   AKI (acute kidney injury) ([place])   Influenza A with pneumonia   Elevated troponin I level   Essential hypertension   GERD without esophagitis   Vitamin B12 deficiency  CAP and influenza A Patient is markedly improved with treatment  Continue cefdinir and azithromycin po on discharge 2 more days  Patient is on Solu-Medrol 40 every 12 which was d/c today  Continue Tamiflu 2 more days    AKI - improving Will need BMP outpatient    Elevated troponin/CAD Appreciate cardiology input, thought to be secondary to demand ischemia, no further workup is necessary.  Patient remains chest pain-free Continue isosorbide Rx on discharge Also Rx nitro prn    HFrEF Echocardiogram done this admission shows EF of 30 to 35% global hypokinesis, RV size is "severely enlarged" with moderately reduced function. Patient is going to get 1 L over 20 hours for AKI and rising BUN, will need to follow fluid status closely. Continue low-sodium diet. Lasix prn on discharge Cardiology outpatient follow-up    HTN  continue hydralazine and metoprolol per home doses   GERD Continue PPI

## 2022-11-11 NOTE — Plan of Care (Signed)
  Problem: Clinical Measurements: Goal: Signs and symptoms of infection will decrease Outcome: Progressing   Problem: Clinical Measurements: Goal: Will remain free from infection Outcome: Progressing   Problem: Activity: Goal: Risk for activity intolerance will decrease Outcome: Progressing   Problem: Nutrition: Goal: Adequate nutrition will be maintained Outcome: Progressing

## 2022-11-11 NOTE — Progress Notes (Signed)
[facility] Cardiology    SUBJECTIVE: Resting comfortably denies any shortness of breath no fever no cough no sputum production patient feels much stronger good appetite   Vitals:   11/11/22 0453 11/11/22 0740 11/11/22 0818 11/11/22 1035  BP: (!) 147/74  135/63 127/64  Pulse: 67  69 72  Resp: 20  17   Temp: 97.7 F (36.5 C)  98.2 F (36.8 C)   TempSrc:      SpO2: 97% 95% 97%   Weight:      Height:         Intake/Output Summary (Last 24 hours) at 11/11/2022 1128 Last data filed at 11/11/2022 6384 Gross per 24 hour  Intake 1417.14 ml  Output 150 ml  Net 1267.14 ml      PHYSICAL EXAM  General: Well developed, well nourished, in no acute distress HEENT:  Normocephalic and atramatic Neck:  No JVD.  Lungs: Clear bilaterally to auscultation and percussion. Heart: HRRR . Normal S1 and S2 without gallops or murmurs.  Abdomen: Bowel sounds are positive, abdomen soft and non-tender  Msk:  Back normal, normal gait. Normal strength and tone for age. Extremities: No clubbing, cyanosis or edema.   Neuro: Alert and oriented X 3. Psych:  Good affect, responds appropriately   LABS: Basic Metabolic Panel: Recent Labs    11/10/22 0509 11/11/22 0509  NA 142 141  K 4.5 4.4  CL 112* 116*  CO2 22 20*  GLUCOSE 175* 155*  BUN 51* 52*  CREATININE 1.76* 1.36*  CALCIUM 8.0* 8.4*   Liver Function Tests: Recent Labs    11/09/22 0411  AST 29  ALT 21  ALKPHOS 53  BILITOT 0.9  PROT 7.1  ALBUMIN 3.7   No results for input(s): "LIPASE", "AMYLASE" in the last 72 hours. CBC: Recent Labs    11/09/22 0411 11/10/22 0509 11/11/22 0509  WBC 26.3* 16.7* 21.8*  NEUTROABS 23.7*  --  20.1*  HGB 12.5* 10.4* 10.9*  HCT 39.3 33.1* 34.1*  MCV 88.5 89.5 87.0  PLT 52* 41* 58*   Cardiac Enzymes: No results for input(s): "CKTOTAL", "CKMB", "CKMBINDEX", "TROPONINI" in the last 72 hours. BNP: Invalid input(s): "POCBNP" D-Dimer: No results for input(s): "DDIMER" in the last 72  hours. Hemoglobin A1C: No results for input(s): "HGBA1C" in the last 72 hours. Fasting Lipid Panel: No results for input(s): "CHOL", "HDL", "LDLCALC", "TRIG", "CHOLHDL", "LDLDIRECT" in the last 72 hours. Thyroid Function Tests: No results for input(s): "TSH", "T4TOTAL", "T3FREE", "THYROIDAB" in the last 72 hours.  Invalid input(s): "FREET3" Anemia Panel: No results for input(s): "VITAMINB12", "FOLATE", "FERRITIN", "TIBC", "IRON", "RETICCTPCT" in the last 72 hours.  ECHOCARDIOGRAM COMPLETE  Result Date: 11/09/2022    ECHOCARDIOGRAM REPORT   Patient Name:   Levi Figueroa Date of Exam: 11/09/2022 Medical Rec #:  [id number]      Height:       70.0 in Accession #:    [id number]     Weight:       183.0 lb Date of Birth:  [birth year]     BSA:          2.010 m Patient Age:    42 years       BP:           102/44 mmHg Patient Gender: M              HR:           74 bpm. Exam Location:  [facility] Procedure: 2D Echo, Color Doppler, Cardiac Doppler  and Intracardiac            Opacification Agent Indications:     I21.4 NSTEMI  History:         Patient has no prior history of Echocardiogram examinations.                  CAD and Previous Myocardial Infarction, Prior CABG; Risk                  Factors:Hypertension and Dyslipidemia.  Sonographer:     Charmayne Sheer Referring Phys:  [id number] JAN A MANSY Diagnosing Phys: Yolonda Kida MD  Sonographer Comments: Suboptimal apical window. IMPRESSIONS  1. Left ventricular ejection fraction, by estimation, is 30 to 35%. The left ventricle has moderately decreased function. The left ventricle demonstrates global hypokinesis. The left ventricular internal cavity size was moderately to severely dilated. Left ventricular diastolic parameters are consistent with Grade II diastolic dysfunction (pseudonormalization).  2. Right ventricular systolic function is moderately reduced. The right ventricular size is severely enlarged.  3. Left atrial size was mild to moderately dilated.  4.  Right atrial size was mild to moderately dilated.  5. The mitral valve is normal in structure. Trivial mitral valve regurgitation.  6. The aortic valve is normal in structure. Aortic valve regurgitation is not visualized. FINDINGS  Left Ventricle: Left ventricular ejection fraction, by estimation, is 30 to 35%. The left ventricle has moderately decreased function. The left ventricle demonstrates global hypokinesis. Definity contrast agent was given IV to delineate the left ventricular endocardial borders. The left ventricular internal cavity size was moderately to severely dilated. There is borderline left ventricular hypertrophy. Left ventricular diastolic parameters are consistent with Grade II diastolic dysfunction (pseudonormalization). Right Ventricle: The right ventricular size is severely enlarged. No increase in right ventricular wall thickness. Right ventricular systolic function is moderately reduced. Left Atrium: Left atrial size was mild to moderately dilated. Right Atrium: Right atrial size was mild to moderately dilated. Pericardium: There is no evidence of pericardial effusion. Mitral Valve: The mitral valve is normal in structure. Trivial mitral valve regurgitation. Tricuspid Valve: The tricuspid valve is normal in structure. Tricuspid valve regurgitation is trivial. Aortic Valve: The aortic valve is normal in structure. Aortic valve regurgitation is not visualized. Aortic valve mean gradient measures 6.0 mmHg. Aortic valve peak gradient measures 9.7 mmHg. Aortic valve area, by VTI measures 1.59 cm. Pulmonic Valve: The pulmonic valve was normal in structure. Pulmonic valve regurgitation is not visualized. Aorta: The ascending aorta was not well visualized. IAS/Shunts: No atrial level shunt detected by color flow Doppler.  LEFT VENTRICLE PLAX 2D LVIDd:         5.80 cm      Diastology LVIDs:         4.80 cm      LV e' medial:    5.77 cm/s LV PW:         1.20 cm      LV E/e' medial:  19.8 LV IVS:         0.80 cm      LV e' lateral:   10.40 cm/s LVOT diam:     1.80 cm      LV E/e' lateral: 11.0 LV SV:         46 LV SV Index:   23 LVOT Area:     2.54 cm  LV Volumes (MOD) LV vol d, MOD A2C: 98.5 ml LV vol d, MOD A4C: 129.0 ml LV vol s, MOD A2C:  39.8 ml LV vol s, MOD A4C: 62.6 ml LV SV MOD A2C:     58.7 ml LV SV MOD A4C:     129.0 ml LV SV MOD BP:      64.0 ml RIGHT VENTRICLE RV Basal diam:  3.30 cm RV Mid diam:    4.40 cm RV S prime:     8.27 cm/s LEFT ATRIUM             Index        RIGHT ATRIUM           Index LA diam:        4.50 cm 2.24 cm/m   RA Area:     14.90 cm LA Vol (A2C):   51.8 ml 25.77 ml/m  RA Volume:   35.00 ml  17.41 ml/m LA Vol (A4C):   50.7 ml 25.22 ml/m LA Biplane Vol: 55.9 ml 27.81 ml/m  AORTIC VALVE                     PULMONIC VALVE AV Area (Vmax):    1.65 cm      PV Vmax:       1.29 m/s AV Area (Vmean):   1.62 cm      PV Vmean:      84.400 cm/s AV Area (VTI):     1.59 cm      PV VTI:        0.247 m AV Vmax:           156.00 cm/s   PV Peak grad:  6.7 mmHg AV Vmean:          116.000 cm/s  PV Mean grad:  3.0 mmHg AV VTI:            0.290 m AV Peak Grad:      9.7 mmHg AV Mean Grad:      6.0 mmHg LVOT Vmax:         101.00 cm/s LVOT Vmean:        73.800 cm/s LVOT VTI:          0.181 m LVOT/AV VTI ratio: 0.62  AORTA Ao Root diam: 3.10 cm MITRAL VALVE                TRICUSPID VALVE MV Area (PHT): 4.33 cm     TR Peak grad:   36.7 mmHg MV Decel Time: 175 msec     TR Vmax:        303.00 cm/s MV E velocity: 114.00 cm/s MV A velocity: 55.10 cm/s   SHUNTS MV E/A ratio:  2.07         Systemic VTI:  0.18 m                             Systemic Diam: 1.80 cm Arles Rumbold D Octavius Shin MD Electronically signed by Yolonda Kida MD Signature Date/Time: 11/09/2022/2:34:57 PM    Final      Echo mildly reduced left ventricular function EF between 30 and 35%  TELEMETRY: Normal sinus rhythm rate of 90 nonspecific ST-T wave changes:  ASSESSMENT AND PLAN:  Principal Problem:   Sepsis due to pneumonia  [facility]) Active Problems:   AKI (acute kidney injury) ([place])   Influenza A with pneumonia   GERD without esophagitis   Vitamin B12 deficiency   Essential hypertension   Elevated troponin I level    Plan Continue supportive care for inhalers  as necessary supplemental oxygen as necessary  History of coronary disease no recent symptoms continue conservative management Continue current therapy for influenza A pneumonia patient improving recovering well PPI therapy for GERD type symptoms Continue hypertension management and control Evaded troponin consistent with demand ischemia not non-STEMI Originally presented with sepsis pneumonia but improving symptom wise We will sign off from a cardiology standpoint reconsult if needed   Yolonda Kida, MD 11/11/2022 11:28 AM

## 2022-11-12 DIAGNOSIS — J189 Pneumonia, unspecified organism: Secondary | ICD-10-CM | POA: Diagnosis not present

## 2022-11-12 DIAGNOSIS — A419 Sepsis, unspecified organism: Secondary | ICD-10-CM | POA: Diagnosis not present

## 2022-11-12 LAB — BASIC METABOLIC PANEL
Anion gap: 5 (ref 5–15)
BUN: 47 mg/dL — ABNORMAL HIGH (ref 8–23)
CO2: 22 mmol/L (ref 22–32)
Calcium: 8.5 mg/dL — ABNORMAL LOW (ref 8.9–10.3)
Chloride: 118 mmol/L — ABNORMAL HIGH (ref 98–111)
Creatinine, Ser: 1.48 mg/dL — ABNORMAL HIGH (ref 0.61–1.24)
GFR, Estimated: 46 mL/min — ABNORMAL LOW (ref >60)
Glucose, Bld: 108 mg/dL — ABNORMAL HIGH (ref 70–99)
Potassium: 4.9 mmol/L (ref 3.5–5.1)
Sodium: 145 mmol/L (ref 135–145)

## 2022-11-12 MED ORDER — NITROGLYCERIN 0.4 MG SL SUBL: 0.4000 mg | SUBLINGUAL_TABLET | SUBLINGUAL | 0 refills | Status: AC | PRN

## 2022-11-12 MED ORDER — ASPIRIN 81 MG PO TBEC: 81.0000 mg | DELAYED_RELEASE_TABLET | Freq: Every day | ORAL | 12 refills | Status: AC

## 2022-11-12 MED ORDER — METOPROLOL TARTRATE 75 MG PO TABS: 75.0000 mg | ORAL_TABLET | Freq: Two times a day (BID) | ORAL | 0 refills | Status: AC

## 2022-11-12 MED ORDER — OSELTAMIVIR PHOSPHATE 30 MG PO CAPS: 30.0000 mg | ORAL_CAPSULE | Freq: Two times a day (BID) | ORAL | 0 refills | Status: AC

## 2022-11-12 MED ORDER — AZITHROMYCIN 250 MG PO TABS: 250.0000 mg | ORAL_TABLET | Freq: Every day | ORAL | 0 refills | Status: DC

## 2022-11-12 MED ORDER — ATORVASTATIN CALCIUM 40 MG PO TABS: 40.0000 mg | ORAL_TABLET | Freq: Every day | ORAL | 0 refills | Status: AC

## 2022-11-12 MED ORDER — FUROSEMIDE 20 MG PO TABS: ORAL_TABLET | ORAL | 11 refills | Status: AC

## 2022-11-12 MED ORDER — GUAIFENESIN ER 600 MG PO TB12: 600.0000 mg | ORAL_TABLET | Freq: Two times a day (BID) | ORAL | 0 refills | Status: DC | PRN

## 2022-11-12 MED ORDER — ISOSORBIDE MONONITRATE ER 30 MG PO TB24: 30.0000 mg | ORAL_TABLET | Freq: Every day | ORAL | 0 refills | Status: AC

## 2022-11-12 MED ORDER — ALBUTEROL SULFATE HFA 108 (90 BASE) MCG/ACT IN AERS: 1.0000 | INHALATION_SPRAY | RESPIRATORY_TRACT | 0 refills | Status: AC | PRN

## 2022-11-12 MED ORDER — HYDROCOD POLI-CHLORPHE POLI ER 10-8 MG/5ML PO SUER: 5.0000 mL | Freq: Two times a day (BID) | ORAL | 0 refills | Status: DC | PRN

## 2022-11-12 MED ORDER — CEFDINIR 300 MG PO CAPS: 300.0000 mg | ORAL_CAPSULE | Freq: Two times a day (BID) | ORAL | 0 refills | Status: DC

## 2022-11-12 NOTE — Progress Notes (Incomplete)
[facility] Cardiology    SUBJECTIVE: ***   Vitals:   11/12/22 0320 11/12/22 0725 11/12/22 0824 11/12/22 0824  BP: 111/62  (!) 127/56 (!) 127/56  Pulse: (!) 103  95 95  Resp: '18  16 16  '$ Temp: 99 F (37.2 C)  97.7 F (36.5 C) 97.7 F (36.5 C)  TempSrc:      SpO2: 94% 92% 92% 91%  Weight:      Height:         Intake/Output Summary (Last 24 hours) at 11/12/2022 1017 Last data filed at 11/12/2022 0605 Gross per 24 hour  Intake 535 ml  Output 700 ml  Net -165 ml      PHYSICAL EXAM  General: Well developed, well nourished, in no acute distress HEENT:  Normocephalic and atramatic Neck:  No JVD.  Lungs: Clear bilaterally to auscultation and percussion. Heart: HRRR . Normal S1 and S2 without gallops or murmurs.  Abdomen: Bowel sounds are positive, abdomen soft and non-tender  Msk:  Back normal, normal gait. Normal strength and tone for age. Extremities: No clubbing, cyanosis or edema.   Neuro: Alert and oriented X 3. Psych:  Good affect, responds appropriately   LABS: Basic Metabolic Panel: Recent Labs    11/11/22 0509 11/12/22 0555  NA 141 145  K 4.4 4.9  CL 116* 118*  CO2 20* 22  GLUCOSE 155* 108*  BUN 52* 47*  CREATININE 1.36* 1.48*  CALCIUM 8.4* 8.5*   Liver Function Tests: No results for input(s): "AST", "ALT", "ALKPHOS", "BILITOT", "PROT", "ALBUMIN" in the last 72 hours. No results for input(s): "LIPASE", "AMYLASE" in the last 72 hours. CBC: Recent Labs    11/10/22 0509 11/11/22 0509  WBC 16.7* 21.8*  NEUTROABS  --  20.1*  HGB 10.4* 10.9*  HCT 33.1* 34.1*  MCV 89.5 87.0  PLT 41* 58*   Cardiac Enzymes: No results for input(s): "CKTOTAL", "CKMB", "CKMBINDEX", "TROPONINI" in the last 72 hours. BNP: Invalid input(s): "POCBNP" D-Dimer: No results for input(s): "DDIMER" in the last 72 hours. Hemoglobin A1C: No results for input(s): "HGBA1C" in the last 72 hours. Fasting Lipid Panel: No results for input(s): "CHOL", "HDL", "LDLCALC", "TRIG",  "CHOLHDL", "LDLDIRECT" in the last 72 hours. Thyroid Function Tests: No results for input(s): "TSH", "T4TOTAL", "T3FREE", "THYROIDAB" in the last 72 hours.  Invalid input(s): "FREET3" Anemia Panel: No results for input(s): "VITAMINB12", "FOLATE", "FERRITIN", "TIBC", "IRON", "RETICCTPCT" in the last 72 hours.  No results found.   Echo ***  TELEMETRY: ***:  ASSESSMENT AND PLAN:  Principal Problem:   Sepsis due to pneumonia [facility]) Active Problems:   AKI (acute kidney injury) ([place])   Influenza A with pneumonia   GERD without esophagitis   Vitamin B12 deficiency   Essential hypertension   Elevated troponin I level    1. ***   Yolonda Kida, MD, PHD [facility] 11/12/2022 10:17 AM

## 2022-11-12 NOTE — Discharge Summary (Signed)
Physician Discharge Summary   Patient: Levi Figueroa MRN: [id number]  DOB: [birth year]   Admit:     Date of Admission: 11/09/2022 Admitted from: home   Discharge: Date of discharge: 11/12/22 Disposition: Home Condition at discharge: good  CODE STATUS: DNR     Discharge Physician: Emeterio Reeve, DO [facility]     PCP: [name], MD  Recommendations for Outpatient Follow-up:  Follow up with PCP Tama High III, MD in 1-2 weeks Follow-up with outpatient cardiology in 1 to 2 weeks to monitor CHF Please obtain labs/tests: CBC, BMP to monitor renal function and thrombocytopenia in 1-2 weeks Please follow up on the following pending results: none   Discharge Instructions     Diet - low sodium heart healthy   Complete by: As directed    Increase activity slowly   Complete by: As directed          Discharge Diagnoses: Principal Problem:   Sepsis due to pneumonia [facility]) Active Problems:   AKI (acute kidney injury) ([place])   Influenza A with pneumonia   Elevated troponin I level   Essential hypertension   GERD without esophagitis   Vitamin B12 deficiency       Hospital Course: 87 year old man with CAD, HFrEF, HTN, MGUS, ITP was admitted 12/14 with dyspnea and cough. Workup in ED revealed CAP with RLL PNA and positive influenza A. Patient was treated w/ Tamiflu, ceftriaxone and azithromycin. Improving but weak. PT ordered 12/16 but unable to see him d/t weekend staffing issues. Continued to improve into 12/17 and ambulating off supplemental O2, ok for discharge home on po medications w/ follow up PCP and cardiology   Consultants:  Cardiology   Procedures: none        ASSESSMENT & PLAN:   Principal Problem:   Sepsis due to pneumonia [facility]) Active Problems:   AKI (acute kidney injury) ([place])   Influenza A with pneumonia   Elevated troponin I level   Essential hypertension   GERD without esophagitis   Vitamin B12  deficiency  CAP and influenza A Patient is markedly improved with treatment  Continue cefdinir and azithromycin po on discharge 2 more days  Patient is on Solu-Medrol 40 every 12 which was d/c today  Continue Tamiflu 2 more days    AKI - improving Will need BMP outpatient    Elevated troponin/CAD Appreciate cardiology input, thought to be secondary to demand ischemia, no further workup is necessary.  Patient remains chest pain-free Continue isosorbide Rx on discharge Also Rx nitro prn    HFrEF Echocardiogram done this admission shows EF of 30 to 35% global hypokinesis, RV size is "severely enlarged" with moderately reduced function. Patient is going to get 1 L over 20 hours for AKI and rising BUN, will need to follow fluid status closely. Continue low-sodium diet. Lasix prn on discharge Cardiology outpatient follow-up    HTN  continue hydralazine and metoprolol per home doses   GERD Continue PPI               Discharge Instructions  Allergies as of 11/12/2022       Reactions   Penicillins Swelling   Has patient had a PCN reaction causing immediate rash, facial/tongue/throat swelling, SOB or lightheadedness with hypotension: Yes Has patient had a PCN reaction causing severe rash involving mucus membranes or skin necrosis: No Has patient had a PCN reaction that required hospitalization No Has patient had a PCN reaction occurring within the  last 10 years: No If all of the above answers are "NO", then may proceed with Cephalosporin use.        Medication List     STOP taking these medications    azelastine 0.1 % nasal spray Commonly known as: ASTELIN   diltiazem 240 MG 24 hr capsule Commonly known as: TIAZAC   doxycycline 100 MG capsule Commonly known as: VIBRAMYCIN   loratadine 10 MG tablet Commonly known as: CLARITIN       TAKE these medications    acetaminophen 500 MG tablet Commonly known as: TYLENOL Take 500 mg by mouth every 6 (six)  hours as needed.   albuterol 108 (90 Base) MCG/ACT inhaler Commonly known as: VENTOLIN HFA Inhale 1-2 puffs into the lungs every 4 (four) hours as needed for wheezing or shortness of breath. What changed: See the new instructions.   aspirin EC 81 MG tablet Take 1 tablet (81 mg total) by mouth daily. Swallow whole. Start taking on: November 13, 2022   atorvastatin 40 MG tablet Commonly known as: LIPITOR Take 1 tablet (40 mg total) by mouth daily at 2 PM. What changed:  medication strength how much to take   azithromycin 250 MG tablet Commonly known as: Zithromax Take 1 tablet (250 mg total) by mouth daily. Take 1 tablet daily for 3 days. Start taking on: November 13, 2022   Cartia XT 240 MG 24 hr capsule Generic drug: diltiazem Take 240 mg by mouth daily.   cefdinir 300 MG capsule Commonly known as: OMNICEF Take 1 capsule (300 mg total) by mouth 2 (two) times daily. Start taking on: November 13, 2022   cetirizine 10 MG tablet Commonly known as: ZYRTEC Take 10 mg by mouth daily.   chlorpheniramine-HYDROcodone 10-8 MG/5ML Commonly known as: TUSSIONEX Take 5 mLs by mouth every 12 (twelve) hours as needed for cough.   cyanocobalamin 1000 MCG tablet Commonly known as: VITAMIN B12 Take 1,000 mcg by mouth daily.   Fish Oil 1200 MG Caps Take 1,200 mg by mouth daily.   furosemide 20 MG tablet Commonly known as: Lasix Take to 1 tablet (20 mg total) by mouth ONCE OR TWICE daily (total daily dose maximum 40 mg) as needed for up to 3 days for increased leg swelling, shortness of breath, weight gain 5+ lbs over 1-2 days. Seek medical care if these symptoms are not improving with increased dose.   guaiFENesin 600 MG 12 hr tablet Commonly known as: MUCINEX Take 1 tablet (600 mg total) by mouth 2 (two) times daily as needed for cough or to loosen phlegm.   isosorbide mononitrate 30 MG 24 hr tablet Commonly known as: IMDUR Take 1 tablet (30 mg total) by mouth daily. Start  taking on: November 13, 2022   lisinopril 10 MG tablet Commonly known as: ZESTRIL Take 10 mg by mouth daily.   magnesium oxide 400 MG tablet Commonly known as: MAG-OX magnesium oxide 400 mg (241.3 mg magnesium) tablet  TAKE 1 TABLET BY MOUTH ONCE DAILY   Metoprolol Tartrate 75 MG Tabs Take 75 mg by mouth 2 (two) times daily. What changed:  medication strength how much to take when to take this   nitroGLYCERIN 0.4 MG SL tablet Commonly known as: NITROSTAT Place 1 tablet (0.4 mg total) under the tongue every 5 (five) minutes as needed for chest pain.   omeprazole 20 MG capsule Commonly known as: PRILOSEC Take 20 mg by mouth daily.   oseltamivir 30 MG capsule Commonly known as: TAMIFLU Take  1 capsule (30 mg total) by mouth 2 (two) times daily for 2 days.   PRESERVISION AREDS 2 PO Take 1 tablet by mouth 2 (two) times daily. What changed: Another medication with the same name was removed. Continue taking this medication, and follow the directions you see here.   psyllium 58.6 % powder Commonly known as: METAMUCIL Take 1 packet by mouth at bedtime.   triamcinolone cream 0.1 % Commonly known as: KENALOG triamcinolone acetonide 0.1 % topical cream  APPLY CREAM EXTERNALLY TWICE DAILY TO AFFECTED AREA AS NEEDED   VITAMIN D-3 PO Take by mouth.         Follow-up Information     Yolonda Kida, MD. Schedule an appointment as soon as possible for a visit.   Specialties: Cardiology, Internal Medicine Contact information: [place] [address] [phone]         [name], MD. Schedule an appointment as soon as possible for a visit.   Specialty: Internal Medicine Contact information: [place] [address] [phone]                 Allergies  Allergen Reactions   Penicillins Swelling    Has patient had a PCN reaction causing immediate rash, facial/tongue/throat swelling, SOB or lightheadedness  with hypotension: Yes Has patient had a PCN reaction causing severe rash involving mucus membranes or skin necrosis: No Has patient had a PCN reaction that required hospitalization No Has patient had a PCN reaction occurring within the last 10 years: No If all of the above answers are "NO", then may proceed with Cephalosporin use.      Subjective: Patient feeling well today, no complaints or concerns.  States breathing is much better.  Was ambulating around the hallways today with PT, not needing oxygen and some fatigue but no shortness of breath   Discharge Exam: BP (!) 127/56 (BP Location: Left Arm)   Pulse 95   Temp 97.7 F (36.5 C)   Resp 16   Ht '5\' 10"'$  (1.778 m)   Wt 79.4 kg   SpO2 95%   BMI 25.11 kg/m  General: Pt is alert, awake, not in acute distress Cardiovascular: RRR, S1/S2 +, no rubs, no gallops Respiratory: CTA bilaterally, no wheezing, no rhonchi Abdominal: Soft, NT, ND, bowel sounds + Extremities: no edema, no cyanosis     The results of significant diagnostics from this hospitalization (including imaging, microbiology, ancillary and laboratory) are listed below for reference.     Microbiology: Recent Results (from the past 240 hour(s))  Blood Culture (routine x 2)     Status: None (Preliminary result)   Collection Time: 11/09/22  4:11 AM   Specimen: BLOOD  Result Value Ref Range Status   Specimen Description BLOOD BLOOD RIGHT ARM  Final   Special Requests   Final    BOTTLES DRAWN AEROBIC AND ANAEROBIC Blood Culture results may not be optimal due to an excessive volume of blood received in culture bottles   Culture   Final    NO GROWTH 3 DAYS Performed at [facility], [address], [place], [place] [address]    Report Status PENDING  Incomplete  Blood Culture (routine x 2)     Status: None (Preliminary result)   Collection Time: 11/09/22  4:11 AM   Specimen: BLOOD  Result Value Ref Range Status   Specimen Description BLOOD BLOOD RIGHT  ARM  Final   Special Requests   Final  BOTTLES DRAWN AEROBIC AND ANAEROBIC Blood Culture results may not be optimal due to an excessive volume of blood received in culture bottles   Culture   Final    NO GROWTH 3 DAYS Performed at [facility], [address] [place], [place] [address]    Report Status PENDING  Incomplete  Resp panel by RT-PCR (RSV, Flu A&B, Covid) Anterior Nasal Swab     Status: Abnormal   Collection Time: 11/09/22  4:11 AM   Specimen: Anterior Nasal Swab  Result Value Ref Range Status   SARS Coronavirus 2 by RT PCR NEGATIVE NEGATIVE Final    Comment: (NOTE) SARS-CoV-2 target nucleic acids are NOT DETECTED.  The SARS-CoV-2 RNA is generally detectable in upper respiratory specimens during the acute phase of infection. The lowest concentration of SARS-CoV-2 viral copies this assay can detect is 138 copies/mL. A negative result does not preclude SARS-Cov-2 infection and should not be used as the sole basis for treatment or other patient management decisions. A negative result may occur with  improper specimen collection/handling, submission of specimen other than nasopharyngeal swab, presence of viral mutation(s) within the areas targeted by this assay, and inadequate number of viral copies(<138 copies/mL). A negative result must be combined with clinical observations, patient history, and epidemiological information. The expected result is Negative.  Fact Sheet for Patients:  EntrepreneurPulse.com.au  Fact Sheet for Healthcare Providers:  IncredibleEmployment.be  This test is no t yet approved or cleared by the [place] FDA and  has been authorized for detection and/or diagnosis of SARS-CoV-2 by FDA under an Emergency Use Authorization (EUA). This EUA will remain  in effect (meaning this test can be used) for the duration of the COVID-19 declaration under Section 564(b)(1) of the Act, 21 U.S.C.section  360bbb-3(b)(1), unless the authorization is terminated  or revoked sooner.       Influenza A by PCR POSITIVE (A) NEGATIVE Final   Influenza B by PCR NEGATIVE NEGATIVE Final    Comment: (NOTE) The Xpert Xpress SARS-CoV-2/FLU/RSV plus assay is intended as an aid in the diagnosis of influenza from Nasopharyngeal swab specimens and should not be used as a sole basis for treatment. Nasal washings and aspirates are unacceptable for Xpert Xpress SARS-CoV-2/FLU/RSV testing.  Fact Sheet for Patients: EntrepreneurPulse.com.au  Fact Sheet for Healthcare Providers: IncredibleEmployment.be  This test is not yet approved or cleared by the [place] FDA and has been authorized for detection and/or diagnosis of SARS-CoV-2 by FDA under an Emergency Use Authorization (EUA). This EUA will remain in effect (meaning this test can be used) for the duration of the COVID-19 declaration under Section 564(b)(1) of the Act, 21 U.S.C. section 360bbb-3(b)(1), unless the authorization is terminated or revoked.     Resp Syncytial Virus by PCR NEGATIVE NEGATIVE Final    Comment: (NOTE) Fact Sheet for Patients: EntrepreneurPulse.com.au  Fact Sheet for Healthcare Providers: IncredibleEmployment.be  This test is not yet approved or cleared by the [place] FDA and has been authorized for detection and/or diagnosis of SARS-CoV-2 by FDA under an Emergency Use Authorization (EUA). This EUA will remain in effect (meaning this test can be used) for the duration of the COVID-19 declaration under Section 564(b)(1) of the Act, 21 U.S.C. section 360bbb-3(b)(1), unless the authorization is terminated or revoked.  Performed at [facility], [address] [place], [place] [address]      Labs: BNP (last 3 results) No results for input(s): "BNP" in the last 8760 hours. Basic Metabolic Panel: Recent Labs  Lab  11/09/22 0411 11/10/22 0509 11/11/22 0509 11/12/22 0555  NA 142 142 141 145  K 4.2 4.5 4.4 4.9  CL 111 112* 116* 118*  CO2 21* 22 20* 22  GLUCOSE 158* 175* 155* 108*  BUN 36* 51* 52* 47*  CREATININE 1.81* 1.76* 1.36* 1.48*  CALCIUM 8.4* 8.0* 8.4* 8.5*   Liver Function Tests: Recent Labs  Lab 11/09/22 0411  AST 29  ALT 21  ALKPHOS 53  BILITOT 0.9  PROT 7.1  ALBUMIN 3.7   No results for input(s): "LIPASE", "AMYLASE" in the last 168 hours. No results for input(s): "AMMONIA" in the last 168 hours. CBC: Recent Labs  Lab 11/06/22 0826 11/09/22 0411 11/10/22 0509 11/11/22 0509  WBC 15.1* 26.3* 16.7* 21.8*  NEUTROABS 12.3* 23.7*  --  20.1*  HGB 13.3 12.5* 10.4* 10.9*  HCT 41.1 39.3 33.1* 34.1*  MCV 88.0 88.5 89.5 87.0  PLT 63* 52* 41* 58*   Cardiac Enzymes: No results for input(s): "CKTOTAL", "CKMB", "CKMBINDEX", "TROPONINI" in the last 168 hours. BNP: Invalid input(s): "POCBNP" CBG: No results for input(s): "GLUCAP" in the last 168 hours. D-Dimer No results for input(s): "DDIMER" in the last 72 hours. Hgb A1c No results for input(s): "HGBA1C" in the last 72 hours. Lipid Profile No results for input(s): "CHOL", "HDL", "LDLCALC", "TRIG", "CHOLHDL", "LDLDIRECT" in the last 72 hours. Thyroid function studies No results for input(s): "TSH", "T4TOTAL", "T3FREE", "THYROIDAB" in the last 72 hours.  Invalid input(s): "FREET3" Anemia work up No results for input(s): "VITAMINB12", "FOLATE", "FERRITIN", "TIBC", "IRON", "RETICCTPCT" in the last 72 hours. Urinalysis    Component Value Date/Time   COLORURINE YELLOW (A) 11/10/2022 0754   APPEARANCEUR HAZY (A) 11/10/2022 0754   APPEARANCEUR Cloudy (A) 03/14/2018 1618   LABSPEC 1.019 11/10/2022 0754   PHURINE 5.0 11/10/2022 0754   GLUCOSEU NEGATIVE 11/10/2022 0754   HGBUR NEGATIVE 11/10/2022 0754   BILIRUBINUR NEGATIVE 11/10/2022 0754   BILIRUBINUR Negative 03/14/2018 [place] 11/10/2022 0754    PROTEINUR 30 (A) 11/10/2022 0754   NITRITE NEGATIVE 11/10/2022 0754   LEUKOCYTESUR NEGATIVE 11/10/2022 0754   Sepsis Labs Recent Labs  Lab 11/06/22 0826 11/09/22 0411 11/10/22 0509 11/11/22 0509  WBC 15.1* 26.3* 16.7* 21.8*   Microbiology Recent Results (from the past 240 hour(s))  Blood Culture (routine x 2)     Status: None (Preliminary result)   Collection Time: 11/09/22  4:11 AM   Specimen: BLOOD  Result Value Ref Range Status   Specimen Description BLOOD BLOOD RIGHT ARM  Final   Special Requests   Final    BOTTLES DRAWN AEROBIC AND ANAEROBIC Blood Culture results may not be optimal due to an excessive volume of blood received in culture bottles   Culture   Final    NO GROWTH 3 DAYS Performed at [facility], [address], [place], [place] [address]    Report Status PENDING  Incomplete  Blood Culture (routine x 2)     Status: None (Preliminary result)   Collection Time: 11/09/22  4:11 AM   Specimen: BLOOD  Result Value Ref Range Status   Specimen Description BLOOD BLOOD RIGHT ARM  Final   Special Requests   Final    BOTTLES DRAWN AEROBIC AND ANAEROBIC Blood Culture results may not be optimal due to an excessive volume of blood received in culture bottles   Culture   Final    NO GROWTH 3 DAYS Performed at [facility], [address] [place], [place]  [address]    Report Status PENDING  Incomplete  Resp panel by RT-PCR (RSV, Flu A&B, Covid) Anterior Nasal Swab     Status: Abnormal   Collection Time: 11/09/22  4:11 AM   Specimen: Anterior Nasal Swab  Result Value Ref Range Status   SARS Coronavirus 2 by RT PCR NEGATIVE NEGATIVE Final    Comment: (NOTE) SARS-CoV-2 target nucleic acids are NOT DETECTED.  The SARS-CoV-2 RNA is generally detectable in upper respiratory specimens during the acute phase of infection. The lowest concentration of SARS-CoV-2 viral copies this assay can detect is 138 copies/mL. A negative result does not preclude  SARS-Cov-2 infection and should not be used as the sole basis for treatment or other patient management decisions. A negative result may occur with  improper specimen collection/handling, submission of specimen other than nasopharyngeal swab, presence of viral mutation(s) within the areas targeted by this assay, and inadequate number of viral copies(<138 copies/mL). A negative result must be combined with clinical observations, patient history, and epidemiological information. The expected result is Negative.  Fact Sheet for Patients:  EntrepreneurPulse.com.au  Fact Sheet for Healthcare Providers:  IncredibleEmployment.be  This test is no t yet approved or cleared by the [place] FDA and  has been authorized for detection and/or diagnosis of SARS-CoV-2 by FDA under an Emergency Use Authorization (EUA). This EUA will remain  in effect (meaning this test can be used) for the duration of the COVID-19 declaration under Section 564(b)(1) of the Act, 21 U.S.C.section 360bbb-3(b)(1), unless the authorization is terminated  or revoked sooner.       Influenza A by PCR POSITIVE (A) NEGATIVE Final   Influenza B by PCR NEGATIVE NEGATIVE Final    Comment: (NOTE) The Xpert Xpress SARS-CoV-2/FLU/RSV plus assay is intended as an aid in the diagnosis of influenza from Nasopharyngeal swab specimens and should not be used as a sole basis for treatment. Nasal washings and aspirates are unacceptable for Xpert Xpress SARS-CoV-2/FLU/RSV testing.  Fact Sheet for Patients: EntrepreneurPulse.com.au  Fact Sheet for Healthcare Providers: IncredibleEmployment.be  This test is not yet approved or cleared by the [place] FDA and has been authorized for detection and/or diagnosis of SARS-CoV-2 by FDA under an Emergency Use Authorization (EUA). This EUA will remain in effect (meaning this test can be used) for the duration of  the COVID-19 declaration under Section 564(b)(1) of the Act, 21 U.S.C. section 360bbb-3(b)(1), unless the authorization is terminated or revoked.     Resp Syncytial Virus by PCR NEGATIVE NEGATIVE Final    Comment: (NOTE) Fact Sheet for Patients: EntrepreneurPulse.com.au  Fact Sheet for Healthcare Providers: IncredibleEmployment.be  This test is not yet approved or cleared by the [place] FDA and has been authorized for detection and/or diagnosis of SARS-CoV-2 by FDA under an Emergency Use Authorization (EUA). This EUA will remain in effect (meaning this test can be used) for the duration of the COVID-19 declaration under Section 564(b)(1) of the Act, 21 U.S.C. section 360bbb-3(b)(1), unless the authorization is terminated or revoked.  Performed at [facility], [address] [place], [place] [address]    Imaging ECHOCARDIOGRAM COMPLETE  Result Date: 11/09/2022    ECHOCARDIOGRAM REPORT   Patient Name:   Levi Figueroa Date of Exam: 11/09/2022 Medical Rec #:  [id number]      Height:       70.0 in Accession #:    [id number]     Weight:       183.0 lb Date of Birth:  May 10, 1935  BSA:          2.010 m Patient Age:    19 years       BP:           102/44 mmHg Patient Gender: M              HR:           74 bpm. Exam Location:  [facility] Procedure: 2D Echo, Color Doppler, Cardiac Doppler and Intracardiac            Opacification Agent Indications:     I21.4 NSTEMI  History:         Patient has no prior history of Echocardiogram examinations.                  CAD and Previous Myocardial Infarction, Prior CABG; Risk                  Factors:Hypertension and Dyslipidemia.  Sonographer:     Charmayne Sheer Referring Phys:  [id number] JAN A MANSY Diagnosing Phys: Yolonda Kida MD  Sonographer Comments: Suboptimal apical window. IMPRESSIONS  1. Left ventricular ejection fraction, by estimation, is 30 to 35%. The left ventricle has moderately decreased  function. The left ventricle demonstrates global hypokinesis. The left ventricular internal cavity size was moderately to severely dilated. Left ventricular diastolic parameters are consistent with Grade II diastolic dysfunction (pseudonormalization).  2. Right ventricular systolic function is moderately reduced. The right ventricular size is severely enlarged.  3. Left atrial size was mild to moderately dilated.  4. Right atrial size was mild to moderately dilated.  5. The mitral valve is normal in structure. Trivial mitral valve regurgitation.  6. The aortic valve is normal in structure. Aortic valve regurgitation is not visualized. FINDINGS  Left Ventricle: Left ventricular ejection fraction, by estimation, is 30 to 35%. The left ventricle has moderately decreased function. The left ventricle demonstrates global hypokinesis. Definity contrast agent was given IV to delineate the left ventricular endocardial borders. The left ventricular internal cavity size was moderately to severely dilated. There is borderline left ventricular hypertrophy. Left ventricular diastolic parameters are consistent with Grade II diastolic dysfunction (pseudonormalization). Right Ventricle: The right ventricular size is severely enlarged. No increase in right ventricular wall thickness. Right ventricular systolic function is moderately reduced. Left Atrium: Left atrial size was mild to moderately dilated. Right Atrium: Right atrial size was mild to moderately dilated. Pericardium: There is no evidence of pericardial effusion. Mitral Valve: The mitral valve is normal in structure. Trivial mitral valve regurgitation. Tricuspid Valve: The tricuspid valve is normal in structure. Tricuspid valve regurgitation is trivial. Aortic Valve: The aortic valve is normal in structure. Aortic valve regurgitation is not visualized. Aortic valve mean gradient measures 6.0 mmHg. Aortic valve peak gradient measures 9.7 mmHg. Aortic valve area, by VTI  measures 1.59 cm. Pulmonic Valve: The pulmonic valve was normal in structure. Pulmonic valve regurgitation is not visualized. Aorta: The ascending aorta was not well visualized. IAS/Shunts: No atrial level shunt detected by color flow Doppler.  LEFT VENTRICLE PLAX 2D LVIDd:         5.80 cm      Diastology LVIDs:         4.80 cm      LV e' medial:    5.77 cm/s LV PW:         1.20 cm      LV E/e' medial:  19.8 LV IVS:        0.80  cm      LV e' lateral:   10.40 cm/s LVOT diam:     1.80 cm      LV E/e' lateral: 11.0 LV SV:         46 LV SV Index:   23 LVOT Area:     2.54 cm  LV Volumes (MOD) LV vol d, MOD A2C: 98.5 ml LV vol d, MOD A4C: 129.0 ml LV vol s, MOD A2C: 39.8 ml LV vol s, MOD A4C: 62.6 ml LV SV MOD A2C:     58.7 ml LV SV MOD A4C:     129.0 ml LV SV MOD BP:      64.0 ml RIGHT VENTRICLE RV Basal diam:  3.30 cm RV Mid diam:    4.40 cm RV S prime:     8.27 cm/s LEFT ATRIUM             Index        RIGHT ATRIUM           Index LA diam:        4.50 cm 2.24 cm/m   RA Area:     14.90 cm LA Vol (A2C):   51.8 ml 25.77 ml/m  RA Volume:   35.00 ml  17.41 ml/m LA Vol (A4C):   50.7 ml 25.22 ml/m LA Biplane Vol: 55.9 ml 27.81 ml/m  AORTIC VALVE                     PULMONIC VALVE AV Area (Vmax):    1.65 cm      PV Vmax:       1.29 m/s AV Area (Vmean):   1.62 cm      PV Vmean:      84.400 cm/s AV Area (VTI):     1.59 cm      PV VTI:        0.247 m AV Vmax:           156.00 cm/s   PV Peak grad:  6.7 mmHg AV Vmean:          116.000 cm/s  PV Mean grad:  3.0 mmHg AV VTI:            0.290 m AV Peak Grad:      9.7 mmHg AV Mean Grad:      6.0 mmHg LVOT Vmax:         101.00 cm/s LVOT Vmean:        73.800 cm/s LVOT VTI:          0.181 m LVOT/AV VTI ratio: 0.62  AORTA Ao Root diam: 3.10 cm MITRAL VALVE                TRICUSPID VALVE MV Area (PHT): 4.33 cm     TR Peak grad:   36.7 mmHg MV Decel Time: 175 msec     TR Vmax:        303.00 cm/s MV E velocity: 114.00 cm/s MV A velocity: 55.10 cm/s   SHUNTS MV E/A ratio:  2.07          Systemic VTI:  0.18 m                             Systemic Diam: 1.80 cm Dwayne Prince Rome MD Electronically signed by Yolonda Kida MD Signature Date/Time: 11/09/2022/2:34:57 PM    Final    DG Chest Port 1 [address]  Result Date: 11/09/2022 CLINICAL DATA:  Cough and shortness of breath and recent positive flu test, initial encounter EXAM: PORTABLE CHEST 1 VIEW COMPARISON:  09/12/2016 FINDINGS: Cardiac shadow is mildly prominent. Postsurgical changes are noted. Aortic calcifications are seen. Patchy basilar infiltrate is noted on the right. No sizable effusion is seen. IMPRESSION: Patchy right basilar infiltrate. Electronically Signed   By: Inez Catalina M.D.   On: 11/09/2022 03:48      Time coordinating discharge: over 30 minutes  SIGNED:  Emeterio Reeve DO [facility]

## 2022-11-12 NOTE — Evaluation (Signed)
Physical Therapy Evaluation Patient Details Name: Levi Figueroa MRN: [id number] DOB: 11-29-1934 Today's Date: 11/12/2022  History of Present Illness  87 year old man with CAD, HFrEF, HTN, MGUS, ITP was admitted 12/14 with dyspnea and cough. Workup in ED revealed CAP with RLL PNA and positive influenza A. Patient was treated w/ Tamiflu, ceftriaxone and azithromycin. Improving but weak.  Clinical Impression  The pt presents this session with endurance deficits d/t PNA and influenza A infections. The pt demonstrates improved oxygenation, by ambulating 200' on RA with O2 saturation>90%. The pt ambulated in session with RW, but will not need to use one to safely transition home. He is a great candidate for our mobility tech team if he does not d/c today in order to maintain and improve current cardiovascular endurance. Pt expected to d/c home with family care once medically stable.        Recommendations for follow up therapy are one component of a multi-disciplinary discharge planning process, led by the attending physician.  Recommendations may be updated based on patient status, additional functional criteria and insurance authorization.  Follow Up Recommendations No PT follow up      Assistance Recommended at Discharge None  Patient can return home with the following       Equipment Recommendations    Recommendations for Other Services       Functional Status Assessment Patient has had a recent decline in their functional status and demonstrates the ability to make significant improvements in function in a reasonable and predictable amount of time.     Precautions / Restrictions Precautions Precautions: Fall Restrictions Weight Bearing Restrictions: No      Mobility  Bed Mobility                    Transfers Overall transfer level: Needs assistance Equipment used: Rolling walker (2 wheels) Transfers: Sit to/from Stand Sit to Stand: Supervision                 Ambulation/Gait Ambulation/Gait assistance: Supervision Gait Distance (Feet): 200 Feet Assistive device: Rolling walker (2 wheels) Gait Pattern/deviations: WFL(Within Functional Limits)          Stairs            Wheelchair Mobility    Modified Rankin (Stroke Patients Only)       Balance Overall balance assessment: No apparent balance deficits (not formally assessed)                                           Pertinent Vitals/Pain Pain Assessment Pain Assessment: No/denies pain    Home Living Family/patient expects to be discharged to:: Private residence Living Arrangements: Spouse/significant other Available Help at Discharge: Family Type of Home: House Home Access: Level entry       Home Layout: One level Home Equipment: Conservation officer, nature (2 wheels);Grab bars - tub/shower;Shower seat      Prior Function Prior Level of Function : Independent/Modified Independent                     Hand Dominance   Dominant Hand: Right    Extremity/Trunk Assessment   Upper Extremity Assessment Upper Extremity Assessment: Overall WFL for tasks assessed    Lower Extremity Assessment Lower Extremity Assessment: Overall WFL for tasks assessed    Cervical / Trunk Assessment Cervical / Trunk Assessment: Normal  Communication  Communication: No difficulties  Cognition Arousal/Alertness: Awake/alert Behavior During Therapy: WFL for tasks assessed/performed Overall Cognitive Status: Within Functional Limits for tasks assessed                                          General Comments      Exercises     Assessment/Plan    PT Assessment Patient needs continued PT services  PT Problem List Cardiopulmonary status limiting activity;Decreased activity tolerance       PT Treatment Interventions Functional mobility training;Therapeutic exercise;Therapeutic activities    PT Goals (Current goals can be found in the Care  Plan section)  Acute Rehab PT Goals Patient Stated Goal: return home PT Goal Formulation: With patient Time For Goal Achievement: 11/26/22 Potential to Achieve Goals: Good    Frequency Min 2X/week     Co-evaluation               AM-PAC PT "6 Clicks" Mobility  Outcome Measure Help needed turning from your back to your side while in a flat bed without using bedrails?: A Little Help needed moving from lying on your back to sitting on the side of a flat bed without using bedrails?: A Little Help needed moving to and from a bed to a chair (including a wheelchair)?: A Little Help needed standing up from a chair using your arms (e.g., wheelchair or bedside chair)?: A Little Help needed to walk in hospital room?: A Little Help needed climbing 3-5 steps with a railing? : A Little 6 Click Score: 18    End of Session Equipment Utilized During Treatment: Oxygen (tapered O2 off during session. Pt able to ambulate on RA with O2 at 94-95%) Activity Tolerance: Patient tolerated treatment well Patient left: in chair;with chair alarm set;with family/visitor present Nurse Communication: Mobility status PT Visit Diagnosis: Unsteadiness on feet (R26.81)    Time: 7373-6681 PT Time Calculation (min) (ACUTE ONLY): 31 min   Charges:   PT Evaluation $PT Eval Low Complexity: 1 Low PT Treatments $Gait Training: 8-22 mins        11:24 AM, 11/12/22 Cherith Tewell A. Saverio Danker PT, DPT Physical Therapist - [facility]   Yoland Scherr A Libbie Bartley 11/12/2022, 11:22 AM

## 2022-11-12 NOTE — Progress Notes (Signed)
Oxygen testing requirements performed by this RN.   O2 sat at rest on room air: _94%_ Test with exertion: O2 sat at exertion on room air: _94%__

## 2022-11-14 LAB — CULTURE, BLOOD (ROUTINE X 2)
Culture: NO GROWTH
Culture: NO GROWTH

## 2022-11-16 DIAGNOSIS — Z09 Encounter for follow-up examination after completed treatment for conditions other than malignant neoplasm: Secondary | ICD-10-CM | POA: Diagnosis not present

## 2022-11-16 DIAGNOSIS — I10 Essential (primary) hypertension: Secondary | ICD-10-CM | POA: Diagnosis not present

## 2022-11-16 DIAGNOSIS — J189 Pneumonia, unspecified organism: Secondary | ICD-10-CM | POA: Diagnosis not present

## 2022-11-16 DIAGNOSIS — I480 Paroxysmal atrial fibrillation: Secondary | ICD-10-CM | POA: Diagnosis not present

## 2022-11-16 DIAGNOSIS — D693 Immune thrombocytopenic purpura: Secondary | ICD-10-CM | POA: Diagnosis not present

## 2022-11-16 DIAGNOSIS — I5022 Chronic systolic (congestive) heart failure: Secondary | ICD-10-CM | POA: Diagnosis not present

## 2022-11-29 DIAGNOSIS — D649 Anemia, unspecified: Secondary | ICD-10-CM | POA: Diagnosis not present

## 2022-11-29 DIAGNOSIS — J189 Pneumonia, unspecified organism: Secondary | ICD-10-CM | POA: Diagnosis not present

## 2022-11-29 DIAGNOSIS — D693 Immune thrombocytopenic purpura: Secondary | ICD-10-CM | POA: Diagnosis not present

## 2022-11-29 DIAGNOSIS — I700 Atherosclerosis of aorta: Secondary | ICD-10-CM | POA: Diagnosis not present

## 2022-11-29 DIAGNOSIS — I10 Essential (primary) hypertension: Secondary | ICD-10-CM | POA: Diagnosis not present

## 2022-11-29 DIAGNOSIS — K219 Gastro-esophageal reflux disease without esophagitis: Secondary | ICD-10-CM | POA: Diagnosis not present

## 2022-11-29 DIAGNOSIS — I5022 Chronic systolic (congestive) heart failure: Secondary | ICD-10-CM | POA: Diagnosis not present

## 2022-11-29 DIAGNOSIS — D692 Other nonthrombocytopenic purpura: Secondary | ICD-10-CM | POA: Diagnosis not present

## 2022-11-29 DIAGNOSIS — M1000 Idiopathic gout, unspecified site: Secondary | ICD-10-CM | POA: Diagnosis not present

## 2022-11-29 DIAGNOSIS — R9389 Abnormal findings on diagnostic imaging of other specified body structures: Secondary | ICD-10-CM | POA: Diagnosis not present

## 2022-11-29 DIAGNOSIS — I2510 Atherosclerotic heart disease of native coronary artery without angina pectoris: Secondary | ICD-10-CM | POA: Diagnosis not present

## 2022-11-29 DIAGNOSIS — I480 Paroxysmal atrial fibrillation: Secondary | ICD-10-CM | POA: Diagnosis not present

## 2022-11-29 DIAGNOSIS — E7849 Other hyperlipidemia: Secondary | ICD-10-CM | POA: Diagnosis not present

## 2022-12-19 DIAGNOSIS — I2510 Atherosclerotic heart disease of native coronary artery without angina pectoris: Secondary | ICD-10-CM | POA: Diagnosis not present

## 2022-12-19 DIAGNOSIS — E785 Hyperlipidemia, unspecified: Secondary | ICD-10-CM | POA: Diagnosis not present

## 2022-12-19 DIAGNOSIS — I480 Paroxysmal atrial fibrillation: Secondary | ICD-10-CM | POA: Diagnosis not present

## 2022-12-19 DIAGNOSIS — I5022 Chronic systolic (congestive) heart failure: Secondary | ICD-10-CM | POA: Diagnosis not present

## 2022-12-19 DIAGNOSIS — I493 Ventricular premature depolarization: Secondary | ICD-10-CM | POA: Diagnosis not present

## 2022-12-19 DIAGNOSIS — I700 Atherosclerosis of aorta: Secondary | ICD-10-CM | POA: Diagnosis not present

## 2022-12-19 DIAGNOSIS — I10 Essential (primary) hypertension: Secondary | ICD-10-CM | POA: Diagnosis not present

## 2022-12-29 DIAGNOSIS — Z951 Presence of aortocoronary bypass graft: Secondary | ICD-10-CM | POA: Diagnosis not present

## 2022-12-29 DIAGNOSIS — R918 Other nonspecific abnormal finding of lung field: Secondary | ICD-10-CM | POA: Diagnosis not present

## 2022-12-29 DIAGNOSIS — J984 Other disorders of lung: Secondary | ICD-10-CM | POA: Diagnosis not present

## 2023-01-02 ENCOUNTER — Encounter: Payer: Self-pay | Admitting: Podiatry

## 2023-01-02 ENCOUNTER — Ambulatory Visit: Payer: PPO | Admitting: Podiatry

## 2023-01-02 VITALS — BP 163/60 | HR 82

## 2023-01-02 DIAGNOSIS — M79674 Pain in right toe(s): Secondary | ICD-10-CM | POA: Diagnosis not present

## 2023-01-02 DIAGNOSIS — M79675 Pain in left toe(s): Secondary | ICD-10-CM

## 2023-01-02 DIAGNOSIS — B351 Tinea unguium: Secondary | ICD-10-CM

## 2023-01-02 NOTE — Progress Notes (Signed)
   SUBJECTIVE Patient presents to office today complaining of elongated, thickened nails that cause pain while ambulating in shoes.  Patient is unable to trim their own nails. Patient is here for further evaluation and treatment.  Past Medical History:  Diagnosis Date   CAD (coronary artery disease)    Cancer ([place])    melanoma   CHF (congestive heart failure) (HCC)systolic    8887 with pneumonia   Colon polyps    GERD (gastroesophageal reflux disease)    Gout    Hyperlipidemia    Hypertension    IDA (iron deficiency anemia)    Kidney stones    MI (myocardial infarction) ([place]) 2004   Mild anemia    Paroxysmal atrial fibrillation (HCC)    PONV (postoperative nausea and vomiting)    Thrombocytopenia ([place])    followed by oncology and PCP   Wears hearing aid in both ears     OBJECTIVE General Patient is awake, alert, and oriented x 3 and in no acute distress. Derm Skin is dry and supple bilateral. Negative open lesions or macerations. Remaining integument unremarkable. Nails are tender, long, thickened and dystrophic with subungual debris, consistent with onychomycosis, 1-5 bilateral. No signs of infection noted. Vasc  DP and PT pedal pulses palpable bilaterally. Temperature gradient within normal limits.  Neuro Epicritic and protective threshold sensation grossly intact bilaterally.  Musculoskeletal Exam No symptomatic pedal deformities noted bilateral. Muscular strength within normal limits.  ASSESSMENT 1.  Pain due to onychomycosis of toenails both  PLAN OF CARE 1. Patient evaluated today.  2. Instructed to maintain good pedal hygiene and foot care.  3. Mechanical debridement of nails 1-5 bilaterally performed using a nail nipper. Filed with dremel without incident.  4. Return to clinic in 3 mos.    Edrick Kins, DPM [facility]  Dr. Edrick Kins, DPM    [address], [place] [address]                Office  [phone]  Fax [phone]

## 2023-02-05 ENCOUNTER — Ambulatory Visit: Payer: PPO | Admitting: Internal Medicine

## 2023-02-05 ENCOUNTER — Inpatient Hospital Stay (HOSPITAL_BASED_OUTPATIENT_CLINIC_OR_DEPARTMENT_OTHER): Payer: PPO | Admitting: Internal Medicine

## 2023-02-05 ENCOUNTER — Other Ambulatory Visit: Payer: PPO

## 2023-02-05 ENCOUNTER — Encounter: Payer: Self-pay | Admitting: Internal Medicine

## 2023-02-05 ENCOUNTER — Inpatient Hospital Stay: Payer: PPO | Attending: Internal Medicine

## 2023-02-05 VITALS — BP 135/63 | HR 60 | Temp 97.8°F | Resp 20 | Wt 183.2 lb

## 2023-02-05 DIAGNOSIS — I509 Heart failure, unspecified: Secondary | ICD-10-CM | POA: Diagnosis not present

## 2023-02-05 DIAGNOSIS — I110 Hypertensive heart disease with heart failure: Secondary | ICD-10-CM | POA: Diagnosis not present

## 2023-02-05 DIAGNOSIS — Z87891 Personal history of nicotine dependence: Secondary | ICD-10-CM | POA: Diagnosis not present

## 2023-02-05 DIAGNOSIS — I2510 Atherosclerotic heart disease of native coronary artery without angina pectoris: Secondary | ICD-10-CM | POA: Insufficient documentation

## 2023-02-05 DIAGNOSIS — D693 Immune thrombocytopenic purpura: Secondary | ICD-10-CM

## 2023-02-05 DIAGNOSIS — Z803 Family history of malignant neoplasm of breast: Secondary | ICD-10-CM | POA: Insufficient documentation

## 2023-02-05 LAB — BASIC METABOLIC PANEL
Anion gap: 6 (ref 5–15)
BUN: 19 mg/dL (ref 8–23)
CO2: 26 mmol/L (ref 22–32)
Calcium: 8.6 mg/dL — ABNORMAL LOW (ref 8.9–10.3)
Chloride: 109 mmol/L (ref 98–111)
Creatinine, Ser: 1.05 mg/dL (ref 0.61–1.24)
GFR, Estimated: >60 mL/min (ref >60)
Glucose, Bld: 97 mg/dL (ref 70–99)
Potassium: 4.4 mmol/L (ref 3.5–5.1)
Sodium: 141 mmol/L (ref 135–145)

## 2023-02-05 LAB — CBC WITH DIFFERENTIAL/PLATELET
Abs Immature Granulocytes: 0.06 10*3/uL (ref 0.00–0.07)
Basophils Absolute: 0.1 10*3/uL (ref 0.0–0.1)
Basophils Relative: 0 %
Eosinophils Absolute: 0.4 10*3/uL (ref 0.0–0.5)
Eosinophils Relative: 3 %
HCT: 39 % (ref 39.0–52.0)
Hemoglobin: 12.4 g/dL — ABNORMAL LOW (ref 13.0–17.0)
Immature Granulocytes: 0 %
Lymphocytes Relative: 8 %
Lymphs Abs: 1.2 10*3/uL (ref 0.7–4.0)
MCH: 28.3 pg (ref 26.0–34.0)
MCHC: 31.8 g/dL (ref 30.0–36.0)
MCV: 89 fL (ref 80.0–100.0)
Monocytes Absolute: 1.3 10*3/uL — ABNORMAL HIGH (ref 0.1–1.0)
Monocytes Relative: 9 %
Neutro Abs: 11.3 10*3/uL — ABNORMAL HIGH (ref 1.7–7.7)
Neutrophils Relative %: 80 %
Platelets: 62 10*3/uL — ABNORMAL LOW (ref 150–400)
RBC: 4.38 MIL/uL (ref 4.22–5.81)
RDW: 14.9 % (ref 11.5–15.5)
WBC: 14.3 10*3/uL — ABNORMAL HIGH (ref 4.0–10.5)
nRBC: 0 % (ref 0.0–0.2)

## 2023-02-05 NOTE — Progress Notes (Signed)
Pt here for ITP follow up. Denies any bleeding or bruising. Appetite is good. Energy is fair. Bowels normal. Denies any dizziness. Dyspnea with exertion.

## 2023-02-05 NOTE — Progress Notes (Signed)
[facility] OFFICE PROGRESS NOTE  Patient Care Team: [name], MD as PCP - General (Internal Medicine)   SUMMARY OF ONCOLOGIC HISTORY:  2012- CHRONIC ITP-[ BMBx; 2012- hypercellular 60%; megakaryocytes/no dyspoiesis; FISH- Neg; Dr.Pandit]; [place]- [place]-Neg for spleen/liver; HIV/hepatitis-NEG; OCT 2017- Rituxan weekly x4  # July 2017- Melanoma s/p excision [? Stage; Dr.Dasher]; status post lithotripsy [Dr. Bernardo Heater; 2019]; 2019-CT scan incidental liver lesion; likely hemangioma [Jan 2020 CT scan]; 2021- COVID  INTERVAL HISTORY: Ambulating independently.  Accompanied by his wife.  88 year old male patient with a history of ITP-currently off asprin surveillance is here for follow-up.  Patient was admitted to the hospital in December for pneumonia.  Treated with antibiotics resolved. Dyspnea with exertion- overall improved.   Denies any bleeding or bruising. Bowels normal. Denies any dizziness.   Otherwise denies any new symptoms. Denies any unusual bleeding.  No blood in stools or black-colored stools.  Review of Systems  Constitutional: Negative.  Negative for chills, diaphoresis, fever, malaise/fatigue and weight loss.  HENT: Negative.  Negative for nosebleeds and sore throat.   Eyes: Negative.  Negative for double vision.  Respiratory: Negative.  Negative for cough, hemoptysis, sputum production, shortness of breath and wheezing.   Cardiovascular: Negative.  Negative for chest pain, palpitations, orthopnea and leg swelling.  Gastrointestinal: Negative.  Negative for abdominal pain, blood in stool, constipation, diarrhea, heartburn, melena, nausea and vomiting.  Genitourinary: Negative.  Negative for dysuria, frequency and urgency.  Musculoskeletal: Negative.  Negative for back pain and joint pain.  Skin: Negative.  Negative for itching and rash.  Neurological: Negative.  Negative for dizziness, tingling, focal weakness, weakness and headaches.  Endo/Heme/Allergies:   Bruises/bleeds easily.  Psychiatric/Behavioral: Negative.  Negative for depression. The patient is not nervous/anxious and does not have insomnia.      PAST MEDICAL HISTORY :  Past Medical History:  Diagnosis Date   CAD (coronary artery disease)    Cancer ([place])    melanoma   CHF (congestive heart failure) (HCC)systolic    0000000 with pneumonia   Colon polyps    GERD (gastroesophageal reflux disease)    Gout    Hyperlipidemia    Hypertension    IDA (iron deficiency anemia)    Kidney stones    MI (myocardial infarction) ([place]) 2004   Mild anemia    Paroxysmal atrial fibrillation (HCC)    PONV (postoperative nausea and vomiting)    Thrombocytopenia ([place])    followed by oncology and PCP   Wears hearing aid in both ears     PAST SURGICAL HISTORY :   Past Surgical History:  Procedure Laterality Date   BLADDER SURGERY     CARDIAC CATHETERIZATION     CATARACT EXTRACTION W/PHACO Right 02/22/2022   Procedure: CATARACT EXTRACTION PHACO AND INTRAOCULAR LENS PLACEMENT ([place]) RIGHT 11.87 01:17.3;  Surgeon: Leandrew Koyanagi, MD;  Location: [facility];  Service: Ophthalmology;  Laterality: Right;   CATARACT EXTRACTION W/PHACO Left 03/08/2022   Procedure: CATARACT EXTRACTION PHACO AND INTRAOCULAR LENS PLACEMENT ([facility])LEFT MiLOOP 4.40 01:01.5;  Surgeon: Leandrew Koyanagi, MD;  Location: [facility];  Service: Ophthalmology;  Laterality: Left;   CORONARY ARTERY BYPASS GRAFT  2004   5 vessel   CYSTOSCOPY WITH LITHOLAPAXY N/A 07/05/2018   Procedure: CYSTOSCOPY WITH LITHOLAPAXY;  Surgeon: [name], MD;  Location: [facility] ORS;  Service: Urology;  Laterality: N/A;   HOLMIUM LASER APPLICATION N/A [id number]   Procedure: HOLMIUM LASER APPLICATION;  Surgeon: [name], MD;  Location: [facility]  ORS;  Service: Urology;  Laterality: N/A;   INGUINAL HERNIA REPAIR Right    LASER ABLATION     x 2 prostatic hypertrophy   TONSILLECTOMY      FAMILY HISTORY :   Family History   Problem Relation Age of Onset   Breast cancer Mother    Diabetes Father    Heart disease Father    Heart disease Brother    Heart disease Brother    Prostate cancer Neg Hx    Bladder Cancer Neg Hx    Kidney cancer Neg Hx     SOCIAL HISTORY:   Social History   Tobacco Use   Smoking status: Former    Types: Cigarettes    Quit date: 1963    Years since quitting: 61.2   Smokeless tobacco: Never  Vaping Use   Vaping Use: Never used  Substance Use Topics   Alcohol use: No    Alcohol/week: 0.0 standard drinks of alcohol   Drug use: No    ALLERGIES:  is allergic to penicillins.  MEDICATIONS:  Current Outpatient Medications  Medication Sig Dispense Refill   acetaminophen (TYLENOL) 500 MG tablet Take 500 mg by mouth every 6 (six) hours as needed.     albuterol (VENTOLIN HFA) 108 (90 Base) MCG/ACT inhaler Inhale 1-2 puffs into the lungs every 4 (four) hours as needed for wheezing or shortness of breath. 18 g 0   aspirin EC 81 MG tablet Take 1 tablet (81 mg total) by mouth daily. Swallow whole. 30 tablet 12   atorvastatin (LIPITOR) 40 MG tablet Take 1 tablet (40 mg total) by mouth daily at 2 PM. 30 tablet 0   cetirizine (ZYRTEC) 10 MG tablet Take 10 mg by mouth daily.     Cholecalciferol (VITAMIN D-3 PO) Take by mouth.     furosemide (LASIX) 20 MG tablet Take to 1 tablet (20 mg total) by mouth ONCE OR TWICE daily (total daily dose maximum 40 mg) as needed for up to 3 days for increased leg swelling, shortness of breath, weight gain 5+ lbs over 1-2 days. Seek medical care if these symptoms are not improving with increased dose. 30 tablet 11   isosorbide mononitrate (IMDUR) 30 MG 24 hr tablet Take 1 tablet (30 mg total) by mouth daily. 30 tablet 0   lisinopril (ZESTRIL) 10 MG tablet Take 10 mg by mouth daily.     magnesium oxide (MAG-OX) 400 MG tablet magnesium oxide 400 mg (241.3 mg magnesium) tablet  TAKE 1 TABLET BY MOUTH ONCE DAILY     metoprolol tartrate 75 MG TABS Take 75 mg by  mouth 2 (two) times daily. 60 tablet 0   Multiple Vitamins-Minerals (PRESERVISION AREDS 2 PO) Take 1 tablet by mouth 2 (two) times daily.     Omega-3 Fatty Acids (FISH OIL) 1200 MG CAPS Take 1,200 mg by mouth daily.     omeprazole (PRILOSEC) 20 MG capsule Take 20 mg by mouth daily.     psyllium (METAMUCIL) 58.6 % powder Take 1 packet by mouth at bedtime.     triamcinolone (KENALOG) 0.1 % triamcinolone acetonide 0.1 % topical cream  APPLY CREAM EXTERNALLY TWICE DAILY TO AFFECTED AREA AS NEEDED     vitamin B-12 (CYANOCOBALAMIN) 1000 MCG tablet Take 1,000 mcg by mouth daily.     nitroGLYCERIN (NITROSTAT) 0.4 MG SL tablet Place 1 tablet (0.4 mg total) under the tongue every 5 (five) minutes as needed for chest pain. (Patient not taking: Reported on 02/05/2023) 30 tablet 0  No current facility-administered medications for this visit.    PHYSICAL EXAMINATION:   BP 135/63   Pulse 60   Temp 97.8 F (36.6 C) (Tympanic)   Resp 20   Wt 183 lb 3.2 oz (83.1 kg)   SpO2 97%   BMI 26.29 kg/m   Filed Weights   02/05/23 1313  Weight: 183 lb 3.2 oz (83.1 kg)    Physical Exam Constitutional:      Comments: He is alone.  HENT:     Head: Normocephalic and atraumatic.     Mouth/Throat:     Pharynx: No oropharyngeal exudate.  Eyes:     Pupils: Pupils are equal, round, and reactive to light.  Cardiovascular:     Rate and Rhythm: Normal rate and regular rhythm.  Pulmonary:     Effort: No respiratory distress.     Breath sounds: No wheezing.  Abdominal:     General: Bowel sounds are normal. There is no distension.     Palpations: Abdomen is soft. There is no mass.     Tenderness: There is no abdominal tenderness. There is no guarding or rebound.  Musculoskeletal:        General: No tenderness. Normal range of motion.     Cervical back: Normal range of motion and neck supple.  Skin:    General: Skin is warm.  Neurological:     Mental Status: He is alert and oriented to person, place, and  time.  Psychiatric:        Mood and Affect: Affect normal.     LABORATORY DATA:  I have reviewed the data as listed    Component Value Date/Time   NA 141 02/05/2023 1252   NA 140 12/17/2012 1229   K 4.4 02/05/2023 1252   K 4.4 12/17/2012 1229   CL 109 02/05/2023 1252   CL 106 12/17/2012 1229   CO2 26 02/05/2023 1252   CO2 26 12/17/2012 1229   GLUCOSE 97 02/05/2023 1252   GLUCOSE 77 12/17/2012 1229   BUN 19 02/05/2023 1252   BUN 19 (H) 12/17/2012 1229   CREATININE 1.05 02/05/2023 1252   CREATININE 1.05 12/17/2012 1229   CALCIUM 8.6 (L) 02/05/2023 1252   CALCIUM 8.4 (L) 12/17/2012 1229   PROT 7.1 11/09/2022 0411   ALBUMIN 3.7 11/09/2022 0411   AST 29 11/09/2022 0411   ALT 21 11/09/2022 0411   ALKPHOS 53 11/09/2022 0411   BILITOT 0.9 11/09/2022 0411   GFRNONAA >60 02/05/2023 1252   GFRNONAA >60 12/17/2012 1229   GFRAA >60 07/12/2020 0952   GFRAA >60 12/17/2012 1229    No results found for: "SPEP", "UPEP"  Lab Results  Component Value Date   WBC 14.3 (H) 02/05/2023   NEUTROABS 11.3 (H) 02/05/2023   HGB 12.4 (L) 02/05/2023   HCT 39.0 02/05/2023   MCV 89.0 02/05/2023   PLT 62 (L) 02/05/2023      Chemistry      Component Value Date/Time   NA 141 02/05/2023 1252   NA 140 12/17/2012 1229   K 4.4 02/05/2023 1252   K 4.4 12/17/2012 1229   CL 109 02/05/2023 1252   CL 106 12/17/2012 1229   CO2 26 02/05/2023 1252   CO2 26 12/17/2012 1229   BUN 19 02/05/2023 1252   BUN 19 (H) 12/17/2012 1229   CREATININE 1.05 02/05/2023 1252   CREATININE 1.05 12/17/2012 1229      Component Value Date/Time   CALCIUM 8.6 (L) 02/05/2023 1252   CALCIUM 8.4 (  L) 12/17/2012 1229   ALKPHOS 53 11/09/2022 0411   AST 29 11/09/2022 0411   ALT 21 11/09/2022 0411   BILITOT 0.9 11/09/2022 0411       RADIOGRAPHIC STUDIES: I have personally reviewed the radiological images as listed and agreed with the findings in the report. No results found.   ASSESSMENT & PLAN:   Idiopathic  thrombocytopenic purpura ([place]) #Chronic ITP-platelets stable between 60-70,000s/p rituxan in 2017.  Asymptomatic.   Platelets today are 62- continue surveillance. Over all stable.   # As pt is asymptomatic- monitor for Would recommend treatment with thrombopoietin stimulating agent if platelets 30-40s  or patient is bleeding.   # Hypocalcemia- 8.7- recommend continue ca+vit D; will check at next visit.   # CAD - off Asprin [Dr.Fath]  # DISPOSITION:  # in 3 months- lab- cbc; vit D 25-OH- # follow up in 6 months with MD/labs- cbc/cmp; Dr.B    Cammie Sickle, MD 02/05/2023 1:34 PM

## 2023-02-05 NOTE — Assessment & Plan Note (Addendum)
#  Chronic ITP-platelets stable between 60-70,000s/p rituxan in 2017.  Asymptomatic.   Platelets today are 75- continue surveillance. Over all stable.   # As pt is asymptomatic- monitor for Would recommend treatment with thrombopoietin stimulating agent if platelets 30-40s  or patient is bleeding.   # Hypocalcemia- 8.7- recommend continue ca+vit D; will check at next visit.   # CAD - off Asprin [Dr.Fath]  # DISPOSITION:  # in 3 months- lab- cbc; vit D 25-OH- # follow up in 6 months with MD/labs- cbc/cmp; Dr.B

## 2023-03-04 DIAGNOSIS — J209 Acute bronchitis, unspecified: Secondary | ICD-10-CM | POA: Diagnosis not present

## 2023-03-04 DIAGNOSIS — B9689 Other specified bacterial agents as the cause of diseases classified elsewhere: Secondary | ICD-10-CM | POA: Diagnosis not present

## 2023-03-04 DIAGNOSIS — J019 Acute sinusitis, unspecified: Secondary | ICD-10-CM | POA: Diagnosis not present

## 2023-03-29 DIAGNOSIS — D2271 Melanocytic nevi of right lower limb, including hip: Secondary | ICD-10-CM | POA: Diagnosis not present

## 2023-03-29 DIAGNOSIS — D225 Melanocytic nevi of trunk: Secondary | ICD-10-CM | POA: Diagnosis not present

## 2023-03-29 DIAGNOSIS — Z85820 Personal history of malignant melanoma of skin: Secondary | ICD-10-CM | POA: Diagnosis not present

## 2023-03-29 DIAGNOSIS — Z85828 Personal history of other malignant neoplasm of skin: Secondary | ICD-10-CM | POA: Diagnosis not present

## 2023-03-29 DIAGNOSIS — L570 Actinic keratosis: Secondary | ICD-10-CM | POA: Diagnosis not present

## 2023-03-29 DIAGNOSIS — D2262 Melanocytic nevi of left upper limb, including shoulder: Secondary | ICD-10-CM | POA: Diagnosis not present

## 2023-03-29 DIAGNOSIS — X32XXXA Exposure to sunlight, initial encounter: Secondary | ICD-10-CM | POA: Diagnosis not present

## 2023-04-03 ENCOUNTER — Ambulatory Visit: Payer: PPO | Admitting: Podiatry

## 2023-04-03 VITALS — BP 145/55

## 2023-04-03 DIAGNOSIS — M79674 Pain in right toe(s): Secondary | ICD-10-CM | POA: Diagnosis not present

## 2023-04-03 DIAGNOSIS — M79675 Pain in left toe(s): Secondary | ICD-10-CM

## 2023-04-03 DIAGNOSIS — B351 Tinea unguium: Secondary | ICD-10-CM | POA: Diagnosis not present

## 2023-04-03 NOTE — Progress Notes (Signed)
   SUBJECTIVE Patient presents to office today complaining of elongated, thickened nails that cause pain while ambulating in shoes.  Patient is unable to trim their own nails. Patient is here for further evaluation and treatment.  Past Medical History:  Diagnosis Date   CAD (coronary artery disease)    Cancer ([facility])    melanoma   CHF (congestive heart failure) (HCC)systolic    2017 with pneumonia   Colon polyps    GERD (gastroesophageal reflux disease)    Gout    Hyperlipidemia    Hypertension    IDA (iron deficiency anemia)    Kidney stones    MI (myocardial infarction) (H[facility]) 2004   Mild anemia    Paroxysmal atrial fibrillation (HCC)    PONV (postoperative nausea and vomiting)    Thrombocytopenia (H[facility])    followed by oncology and PCP   Wears hearing aid in both ears     OBJECTIVE General Patient is awake, alert, and oriented x 3 and in no acute distress. Derm Skin is dry and supple bilateral. Negative open lesions or macerations. Remaining integument unremarkable. Nails are tender, long, thickened and dystrophic with subungual debris, consistent with onychomycosis, 1-5 bilateral. No signs of infection noted. Vasc  DP and PT pedal pulses palpable bilaterally. Temperature gradient within normal limits.  Neuro Epicritic and protective threshold sensation grossly intact bilaterally.  Musculoskeletal Exam No symptomatic pedal deformities noted bilateral. Muscular strength within normal limits.  ASSESSMENT 1.  Pain due to onychomycosis of toenails both  PLAN OF CARE 1. Patient evaluated today.  2. Instructed to maintain good pedal hygiene and foot care.  3. Mechanical debridement of nails 1-5 bilaterally performed using a nail nipper. Filed with dremel without incident.  4. Return to clinic in 3 mos.    Marylou Wages M. Skye Rodarte, DPM [facility]  Dr. Bhavika Schnider M. Precilla Purnell, DPM    [address]                                     [place], [place] [address]                Office  [phone]  Fax [phone]   

## 2023-04-11 DIAGNOSIS — I10 Essential (primary) hypertension: Secondary | ICD-10-CM | POA: Diagnosis not present

## 2023-04-11 DIAGNOSIS — E7849 Other hyperlipidemia: Secondary | ICD-10-CM | POA: Diagnosis not present

## 2023-04-11 DIAGNOSIS — D649 Anemia, unspecified: Secondary | ICD-10-CM | POA: Diagnosis not present

## 2023-04-11 DIAGNOSIS — M1000 Idiopathic gout, unspecified site: Secondary | ICD-10-CM | POA: Diagnosis not present

## 2023-04-17 DIAGNOSIS — Z961 Presence of intraocular lens: Secondary | ICD-10-CM | POA: Diagnosis not present

## 2023-04-17 DIAGNOSIS — H353132 Nonexudative age-related macular degeneration, bilateral, intermediate dry stage: Secondary | ICD-10-CM | POA: Diagnosis not present

## 2023-04-17 DIAGNOSIS — H35372 Puckering of macula, left eye: Secondary | ICD-10-CM | POA: Diagnosis not present

## 2023-04-18 DIAGNOSIS — I700 Atherosclerosis of aorta: Secondary | ICD-10-CM | POA: Diagnosis not present

## 2023-04-18 DIAGNOSIS — D693 Immune thrombocytopenic purpura: Secondary | ICD-10-CM | POA: Diagnosis not present

## 2023-04-18 DIAGNOSIS — Z85820 Personal history of malignant melanoma of skin: Secondary | ICD-10-CM | POA: Diagnosis not present

## 2023-04-18 DIAGNOSIS — M1A00X0 Idiopathic chronic gout, unspecified site, without tophus (tophi): Secondary | ICD-10-CM | POA: Diagnosis not present

## 2023-04-18 DIAGNOSIS — I10 Essential (primary) hypertension: Secondary | ICD-10-CM | POA: Diagnosis not present

## 2023-04-18 DIAGNOSIS — I2510 Atherosclerotic heart disease of native coronary artery without angina pectoris: Secondary | ICD-10-CM | POA: Diagnosis not present

## 2023-04-18 DIAGNOSIS — I480 Paroxysmal atrial fibrillation: Secondary | ICD-10-CM | POA: Diagnosis not present

## 2023-04-18 DIAGNOSIS — I5022 Chronic systolic (congestive) heart failure: Secondary | ICD-10-CM | POA: Diagnosis not present

## 2023-04-18 DIAGNOSIS — E785 Hyperlipidemia, unspecified: Secondary | ICD-10-CM | POA: Diagnosis not present

## 2023-04-18 DIAGNOSIS — N400 Enlarged prostate without lower urinary tract symptoms: Secondary | ICD-10-CM | POA: Diagnosis not present

## 2023-04-18 DIAGNOSIS — K219 Gastro-esophageal reflux disease without esophagitis: Secondary | ICD-10-CM | POA: Diagnosis not present

## 2023-04-18 DIAGNOSIS — D692 Other nonthrombocytopenic purpura: Secondary | ICD-10-CM | POA: Diagnosis not present

## 2023-05-08 ENCOUNTER — Inpatient Hospital Stay: Payer: PPO

## 2023-05-10 ENCOUNTER — Inpatient Hospital Stay: Payer: PPO | Attending: Internal Medicine

## 2023-05-10 DIAGNOSIS — D693 Immune thrombocytopenic purpura: Secondary | ICD-10-CM | POA: Insufficient documentation

## 2023-05-10 LAB — CBC WITH DIFFERENTIAL (CANCER CENTER ONLY)
Abs Immature Granulocytes: 0.08 10*3/uL — ABNORMAL HIGH (ref 0.00–0.07)
Basophils Absolute: 0.1 10*3/uL (ref 0.0–0.1)
Basophils Relative: 1 %
Eosinophils Absolute: 0.5 10*3/uL (ref 0.0–0.5)
Eosinophils Relative: 4 %
HCT: 40.1 % (ref 39.0–52.0)
Hemoglobin: 12.6 g/dL — ABNORMAL LOW (ref 13.0–17.0)
Immature Granulocytes: 1 %
Lymphocytes Relative: 6 %
Lymphs Abs: 0.8 10*3/uL (ref 0.7–4.0)
MCH: 27.6 pg (ref 26.0–34.0)
MCHC: 31.4 g/dL (ref 30.0–36.0)
MCV: 87.7 fL (ref 80.0–100.0)
Monocytes Absolute: 0.9 10*3/uL (ref 0.1–1.0)
Monocytes Relative: 7 %
Neutro Abs: 11.2 10*3/uL — ABNORMAL HIGH (ref 1.7–7.7)
Neutrophils Relative %: 81 %
Platelet Count: 64 10*3/uL — ABNORMAL LOW (ref 150–400)
RBC: 4.57 MIL/uL (ref 4.22–5.81)
RDW: 15.4 % (ref 11.5–15.5)
WBC Count: 13.6 10*3/uL — ABNORMAL HIGH (ref 4.0–10.5)
nRBC: 0 % (ref 0.0–0.2)

## 2023-05-10 LAB — VITAMIN D 25 HYDROXY (VIT D DEFICIENCY, FRACTURES): Vit D, 25-Hydroxy: 34.88 ng/mL (ref 30–100)

## 2023-06-01 DIAGNOSIS — M542 Cervicalgia: Secondary | ICD-10-CM | POA: Diagnosis not present

## 2023-06-19 DIAGNOSIS — I493 Ventricular premature depolarization: Secondary | ICD-10-CM | POA: Diagnosis not present

## 2023-06-19 DIAGNOSIS — E7849 Other hyperlipidemia: Secondary | ICD-10-CM | POA: Diagnosis not present

## 2023-06-19 DIAGNOSIS — I10 Essential (primary) hypertension: Secondary | ICD-10-CM | POA: Diagnosis not present

## 2023-06-19 DIAGNOSIS — I480 Paroxysmal atrial fibrillation: Secondary | ICD-10-CM | POA: Diagnosis not present

## 2023-06-19 DIAGNOSIS — I5022 Chronic systolic (congestive) heart failure: Secondary | ICD-10-CM | POA: Diagnosis not present

## 2023-06-22 DIAGNOSIS — M542 Cervicalgia: Secondary | ICD-10-CM | POA: Diagnosis not present

## 2023-07-06 ENCOUNTER — Ambulatory Visit: Payer: PPO | Admitting: Podiatry

## 2023-07-06 ENCOUNTER — Encounter: Payer: Self-pay | Admitting: Podiatry

## 2023-07-06 DIAGNOSIS — M79674 Pain in right toe(s): Secondary | ICD-10-CM

## 2023-07-06 DIAGNOSIS — M79675 Pain in left toe(s): Secondary | ICD-10-CM | POA: Diagnosis not present

## 2023-07-06 DIAGNOSIS — B351 Tinea unguium: Secondary | ICD-10-CM | POA: Diagnosis not present

## 2023-07-10 DIAGNOSIS — M542 Cervicalgia: Secondary | ICD-10-CM | POA: Diagnosis not present

## 2023-07-16 NOTE — Progress Notes (Signed)
   Chief Complaint  Patient presents with   Nail Problem    "Trim my toenails."    SUBJECTIVE Patient presents to office today complaining of elongated, thickened nails that cause pain while ambulating in shoes.  Patient is unable to trim their own nails. Patient is here for further evaluation and treatment.  Past Medical History:  Diagnosis Date   CAD (coronary artery disease)    Cancer (HCC)    melanoma   CHF (congestive heart failure) (HCC)systolic    2017 with pneumonia   Colon polyps    GERD (gastroesophageal reflux disease)    Gout    Hyperlipidemia    Hypertension    IDA (iron deficiency anemia)    Kidney stones    MI (myocardial infarction) (HCC) 2004   Mild anemia    Paroxysmal atrial fibrillation (HCC)    PONV (postoperative nausea and vomiting)    Thrombocytopenia (HCC)    followed by oncology and PCP   Wears hearing aid in both ears     Allergies  Allergen Reactions   Penicillins Swelling    Has patient had a PCN reaction causing immediate rash, facial/tongue/throat swelling, SOB or lightheadedness with hypotension: Yes Has patient had a PCN reaction causing severe rash involving mucus membranes or skin necrosis: No Has patient had a PCN reaction that required hospitalization No Has patient had a PCN reaction occurring within the last 10 years: No If all of the above answers are "NO", then may proceed with Cephalosporin use.      OBJECTIVE General Patient is awake, alert, and oriented x 3 and in no acute distress. Derm Skin is dry and supple bilateral. Negative open lesions or macerations. Remaining integument unremarkable. Nails are tender, long, thickened and dystrophic with subungual debris, consistent with onychomycosis, 1-5 bilateral. No signs of infection noted. Vasc  DP and PT pedal pulses palpable bilaterally. Temperature gradient within normal limits.  Neuro Epicritic and protective threshold sensation grossly intact bilaterally.  Musculoskeletal  Exam No symptomatic pedal deformities noted bilateral. Muscular strength within normal limits.  ASSESSMENT 1.  Pain due to onychomycosis of toenails both  PLAN OF CARE 1. Patient evaluated today.  2. Instructed to maintain good pedal hygiene and foot care.  3. Mechanical debridement of nails 1-5 bilaterally performed using a nail nipper. Filed with dremel without incident.  4. Return to clinic in 3 mos.    Felecia Shelling, DPM [facility]  Dr. Felecia Shelling, DPM    [address] [place], [place] [address]                Office [phone]  Fax [phone]

## 2023-07-17 DIAGNOSIS — J300 Vasomotor rhinitis: Secondary | ICD-10-CM | POA: Diagnosis not present

## 2023-07-18 DIAGNOSIS — M542 Cervicalgia: Secondary | ICD-10-CM | POA: Diagnosis not present

## 2023-07-23 DIAGNOSIS — M542 Cervicalgia: Secondary | ICD-10-CM | POA: Diagnosis not present

## 2023-07-31 DIAGNOSIS — M542 Cervicalgia: Secondary | ICD-10-CM | POA: Diagnosis not present

## 2023-08-07 DIAGNOSIS — M542 Cervicalgia: Secondary | ICD-10-CM | POA: Diagnosis not present

## 2023-08-08 ENCOUNTER — Inpatient Hospital Stay: Payer: PPO | Attending: Internal Medicine

## 2023-08-08 ENCOUNTER — Encounter: Payer: Self-pay | Admitting: Internal Medicine

## 2023-08-08 ENCOUNTER — Inpatient Hospital Stay: Payer: PPO | Admitting: Internal Medicine

## 2023-08-08 VITALS — BP 153/60 | HR 64 | Temp 98.0°F | Ht 70.0 in | Wt 183.0 lb

## 2023-08-08 DIAGNOSIS — Z803 Family history of malignant neoplasm of breast: Secondary | ICD-10-CM | POA: Diagnosis not present

## 2023-08-08 DIAGNOSIS — I2510 Atherosclerotic heart disease of native coronary artery without angina pectoris: Secondary | ICD-10-CM | POA: Insufficient documentation

## 2023-08-08 DIAGNOSIS — R0609 Other forms of dyspnea: Secondary | ICD-10-CM | POA: Insufficient documentation

## 2023-08-08 DIAGNOSIS — D693 Immune thrombocytopenic purpura: Secondary | ICD-10-CM

## 2023-08-08 DIAGNOSIS — Z85820 Personal history of malignant melanoma of skin: Secondary | ICD-10-CM | POA: Diagnosis not present

## 2023-08-08 DIAGNOSIS — Z87891 Personal history of nicotine dependence: Secondary | ICD-10-CM | POA: Insufficient documentation

## 2023-08-08 LAB — CBC WITH DIFFERENTIAL (CANCER CENTER ONLY)
Abs Immature Granulocytes: 0.09 10*3/uL — ABNORMAL HIGH (ref 0.00–0.07)
Basophils Absolute: 0.1 10*3/uL (ref 0.0–0.1)
Basophils Relative: 0 %
Eosinophils Absolute: 0.4 10*3/uL (ref 0.0–0.5)
Eosinophils Relative: 3 %
HCT: 36.7 % — ABNORMAL LOW (ref 39.0–52.0)
Hemoglobin: 11.6 g/dL — ABNORMAL LOW (ref 13.0–17.0)
Immature Granulocytes: 1 %
Lymphocytes Relative: 8 %
Lymphs Abs: 0.9 10*3/uL (ref 0.7–4.0)
MCH: 28.8 pg (ref 26.0–34.0)
MCHC: 31.6 g/dL (ref 30.0–36.0)
MCV: 91.1 fL (ref 80.0–100.0)
Monocytes Absolute: 0.8 10*3/uL (ref 0.1–1.0)
Monocytes Relative: 7 %
Neutro Abs: 9.6 10*3/uL — ABNORMAL HIGH (ref 1.7–7.7)
Neutrophils Relative %: 81 %
Platelet Count: 58 10*3/uL — ABNORMAL LOW (ref 150–400)
RBC: 4.03 MIL/uL — ABNORMAL LOW (ref 4.22–5.81)
RDW: 16.1 % — ABNORMAL HIGH (ref 11.5–15.5)
WBC Count: 11.8 10*3/uL — ABNORMAL HIGH (ref 4.0–10.5)
nRBC: 0 % (ref 0.0–0.2)

## 2023-08-08 LAB — CMP (CANCER CENTER ONLY)
ALT: 12 U/L (ref 0–44)
AST: 12 U/L — ABNORMAL LOW (ref 15–41)
Albumin: 3.7 g/dL (ref 3.5–5.0)
Alkaline Phosphatase: 57 U/L (ref 38–126)
Anion gap: 5 (ref 5–15)
BUN: 24 mg/dL — ABNORMAL HIGH (ref 8–23)
CO2: 22 mmol/L (ref 22–32)
Calcium: 8.2 mg/dL — ABNORMAL LOW (ref 8.9–10.3)
Chloride: 113 mmol/L — ABNORMAL HIGH (ref 98–111)
Creatinine: 1.02 mg/dL (ref 0.61–1.24)
GFR, Estimated: >60 mL/min (ref >60)
Glucose, Bld: 93 mg/dL (ref 70–99)
Potassium: 3.9 mmol/L (ref 3.5–5.1)
Sodium: 140 mmol/L (ref 135–145)
Total Bilirubin: 0.3 mg/dL (ref 0.3–1.2)
Total Protein: 6.5 g/dL (ref 6.5–8.1)

## 2023-08-08 NOTE — Assessment & Plan Note (Addendum)
#  Chronic ITP-platelets stable between 60-70,000s/p rituxan in 2017.  Asymptomatic.   # Platelets today are 10- continue surveillance. Over all stable.   # As pt is asymptomatic- [poor response to steroids] monitor for Would recommend treatment with Rituxan /thrombopoietin stimulating agent if platelets < 50 or patient is bleeding.   # Hypocalcemia- 8.7- JUNE 2024- Vit D: 64-continue ca+vit D; will check at next visit.   # CAD - Asprin 81mg /day [Dr.Fath]  # DISPOSITION:  # in 3 months- lab- cbc;  # follow up in 6 months with MD/labs- cbc/cmp; Dr.B  # 25 minutes face-to-face with the patient discussing the above plan of care; more than 50% of time spent on prognosis/ natural history; counseling and coordination.

## 2023-08-08 NOTE — Progress Notes (Signed)
[facility] OFFICE PROGRESS NOTE  Patient Care Team: Lynnea Ferrier, MD as PCP - General (Internal Medicine) Earna Coder, MD as Consulting Physician (Oncology)   SUMMARY OF ONCOLOGIC HISTORY:  2012- CHRONIC ITP-[ BMBx; 2012- hypercellular 60%; megakaryocytes/no dyspoiesis; FISH- Neg; Dr.Pandit]; [place]- Abdo-Neg for spleen/liver; HIV/hepatitis-NEG; OCT 2017- Rituxan weekly x4  # July 2017- Melanoma s/p excision [? Stage; Dr.Dasher]; status post lithotripsy [Dr. Lonna Cobb; 2019]; 2019-CT scan incidental liver lesion; likely hemangioma [Jan 2020 CT scan]; 2021- COVID  INTERVAL HISTORY: Ambulating independently.  Accompanied by his wife.  87 year old male patient with a history of ITP-currently off asprin surveillance is here for follow-up.  Patient was admitted to the hospital in December for pneumonia.  Treated with antibiotics resolved. Dyspnea with exertion- overall improved.   Denies any bleeding or bruising. Bowels normal. Denies any dizziness.   Otherwise denies any new symptoms. Denies any unusual bleeding.  No blood in stools or black-colored stools.  Review of Systems  Constitutional: Negative.  Negative for chills, diaphoresis, fever, malaise/fatigue and weight loss.  HENT: Negative.  Negative for nosebleeds and sore throat.   Eyes: Negative.  Negative for double vision.  Respiratory: Negative.  Negative for cough, hemoptysis, sputum production, shortness of breath and wheezing.   Cardiovascular: Negative.  Negative for chest pain, palpitations, orthopnea and leg swelling.  Gastrointestinal: Negative.  Negative for abdominal pain, blood in stool, constipation, diarrhea, heartburn, melena, nausea and vomiting.  Genitourinary: Negative.  Negative for dysuria, frequency and urgency.  Musculoskeletal: Negative.  Negative for back pain and joint pain.  Skin: Negative.  Negative for itching and rash.  Neurological: Negative.  Negative for dizziness, tingling,  focal weakness, weakness and headaches.  Endo/Heme/Allergies:  Bruises/bleeds easily.  Psychiatric/Behavioral: Negative.  Negative for depression. The patient is not nervous/anxious and does not have insomnia.      PAST MEDICAL HISTORY :  Past Medical History:  Diagnosis Date   CAD (coronary artery disease)    Cancer (HCC)    melanoma   CHF (congestive heart failure) (HCC)systolic    2017 with pneumonia   Colon polyps    GERD (gastroesophageal reflux disease)    Gout    Hyperlipidemia    Hypertension    IDA (iron deficiency anemia)    Kidney stones    MI (myocardial infarction) (HCC) 2004   Mild anemia    Paroxysmal atrial fibrillation (HCC)    PONV (postoperative nausea and vomiting)    Thrombocytopenia (HCC)    followed by oncology and PCP   Wears hearing aid in both ears     PAST SURGICAL HISTORY :   Past Surgical History:  Procedure Laterality Date   BLADDER SURGERY     CARDIAC CATHETERIZATION     CATARACT EXTRACTION W/PHACO Right 02/22/2022   Procedure: CATARACT EXTRACTION PHACO AND INTRAOCULAR LENS PLACEMENT (IOC) RIGHT 11.87 01:17.3;  Surgeon: Lockie Mola, MD;  Location: [facility];  Service: Ophthalmology;  Laterality: Right;   CATARACT EXTRACTION W/PHACO Left 03/08/2022   Procedure: CATARACT EXTRACTION PHACO AND INTRAOCULAR LENS PLACEMENT (IOC)LEFT MiLOOP 4.40 01:01.5;  Surgeon: Lockie Mola, MD;  Location: [facility];  Service: Ophthalmology;  Laterality: Left;   CORONARY ARTERY BYPASS GRAFT  2004   5 vessel   CYSTOSCOPY WITH LITHOLAPAXY N/A 07/05/2018   Procedure: CYSTOSCOPY WITH LITHOLAPAXY;  Surgeon: Riki Altes, MD;  Location: [facility];  Service: Urology;  Laterality: N/A;   HOLMIUM LASER APPLICATION N/A 07/05/2018   Procedure: HOLMIUM LASER APPLICATION;  Surgeon: Riki Altes, MD;  Location: [facility];  Service: Urology;  Laterality: N/A;   INGUINAL HERNIA REPAIR Right    LASER ABLATION     x 2 prostatic  hypertrophy   TONSILLECTOMY      FAMILY HISTORY :   Family History  Problem Relation Age of Onset   Breast cancer Mother    Diabetes Father    Heart disease Father    Heart disease Brother    Heart disease Brother    Prostate cancer Neg Hx    Bladder Cancer Neg Hx    Kidney cancer Neg Hx     SOCIAL HISTORY:   Social History   Tobacco Use   Smoking status: Former    Current packs/day: 0.00    Types: Cigarettes    Quit date: 1963    Years since quitting: 61.7   Smokeless tobacco: Never  Vaping Use   Vaping status: Never Used  Substance Use Topics   Alcohol use: No    Alcohol/week: 0.0 standard drinks of alcohol   Drug use: No    ALLERGIES:  is allergic to penicillins.  MEDICATIONS:  Current Outpatient Medications  Medication Sig Dispense Refill   acetaminophen (TYLENOL) 500 MG tablet Take 500 mg by mouth every 6 (six) hours as needed.     albuterol (VENTOLIN HFA) 108 (90 Base) MCG/ACT inhaler Inhale 1-2 puffs into the lungs every 4 (four) hours as needed for wheezing or shortness of breath. 18 g 0   aspirin EC 81 MG tablet Take 1 tablet (81 mg total) by mouth daily. Swallow whole. 30 tablet 12   atorvastatin (LIPITOR) 40 MG tablet Take 1 tablet (40 mg total) by mouth daily at 2 PM. 30 tablet 0   cetirizine (ZYRTEC) 10 MG tablet Take 10 mg by mouth daily.     Cholecalciferol (VITAMIN D-3 PO) Take by mouth.     furosemide (LASIX) 20 MG tablet Take to 1 tablet (20 mg total) by mouth ONCE OR TWICE daily (total daily dose maximum 40 mg) as needed for up to 3 days for increased leg swelling, shortness of breath, weight gain 5+ lbs over 1-2 days. Seek medical care if these symptoms are not improving with increased dose. 30 tablet 11   ipratropium (ATROVENT) 0.06 % nasal spray Place 2 sprays into both nostrils every 8 (eight) hours.     isosorbide mononitrate (IMDUR) 30 MG 24 hr tablet Take 1 tablet (30 mg total) by mouth daily. 30 tablet 0   lisinopril (ZESTRIL) 10 MG tablet  Take 10 mg by mouth daily.     magnesium oxide (MAG-OX) 400 MG tablet magnesium oxide 400 mg (241.3 mg magnesium) tablet  TAKE 1 TABLET BY MOUTH ONCE DAILY     metoprolol tartrate 75 MG TABS Take 75 mg by mouth 2 (two) times daily. (Patient taking differently: Take 50 mg by mouth 2 (two) times daily.) 60 tablet 0   Multiple Vitamins-Minerals (PRESERVISION AREDS 2 PO) Take 1 tablet by mouth 2 (two) times daily.     nitroGLYCERIN (NITROSTAT) 0.4 MG SL tablet Place 1 tablet (0.4 mg total) under the tongue every 5 (five) minutes as needed for chest pain. 30 tablet 0   Omega-3 Fatty Acids (FISH OIL) 1200 MG CAPS Take 1,200 mg by mouth daily.     omeprazole (PRILOSEC) 20 MG capsule Take 20 mg by mouth daily.     psyllium (METAMUCIL) 58.6 % powder Take 1 packet by mouth at bedtime.  triamcinolone (KENALOG) 0.1 % triamcinolone acetonide 0.1 % topical cream  APPLY CREAM EXTERNALLY TWICE DAILY TO AFFECTED AREA AS NEEDED     vitamin B-12 (CYANOCOBALAMIN) 1000 MCG tablet Take 1,000 mcg by mouth daily.     No current facility-administered medications for this visit.    PHYSICAL EXAMINATION:   BP (!) 153/60 (BP Location: Left Arm, Patient Position: Sitting, Cuff Size: Normal)   Pulse 64   Temp 98 F (36.7 C) (Tympanic)   Ht 5\' 10"  (1.778 m)   Wt 183 lb (83 kg)   SpO2 99%   BMI 26.26 kg/m   Filed Weights   08/08/23 0859  Weight: 183 lb (83 kg)    Physical Exam Constitutional:      Comments: He is alone.  HENT:     Head: Normocephalic and atraumatic.     Mouth/Throat:     Pharynx: No oropharyngeal exudate.  Eyes:     Pupils: Pupils are equal, round, and reactive to light.  Cardiovascular:     Rate and Rhythm: Normal rate and regular rhythm.  Pulmonary:     Effort: No respiratory distress.     Breath sounds: No wheezing.  Abdominal:     General: Bowel sounds are normal. There is no distension.     Palpations: Abdomen is soft. There is no mass.     Tenderness: There is no  abdominal tenderness. There is no guarding or rebound.  Musculoskeletal:        General: No tenderness. Normal range of motion.     Cervical back: Normal range of motion and neck supple.  Skin:    General: Skin is warm.  Neurological:     Mental Status: He is alert and oriented to person, place, and time.  Psychiatric:        Mood and Affect: Affect normal.     LABORATORY DATA:  I have reviewed the data as listed    Component Value Date/Time   NA 140 08/08/2023 0904   NA 140 12/17/2012 1229   K 3.9 08/08/2023 0904   K 4.4 12/17/2012 1229   CL 113 (H) 08/08/2023 0904   CL 106 12/17/2012 1229   CO2 22 08/08/2023 0904   CO2 26 12/17/2012 1229   GLUCOSE 93 08/08/2023 0904   GLUCOSE 77 12/17/2012 1229   BUN 24 (H) 08/08/2023 0904   BUN 19 (H) 12/17/2012 1229   CREATININE 1.02 08/08/2023 0904   CREATININE 1.05 12/17/2012 1229   CALCIUM 8.2 (L) 08/08/2023 0904   CALCIUM 8.4 (L) 12/17/2012 1229   PROT 6.5 08/08/2023 0904   ALBUMIN 3.7 08/08/2023 0904   AST 12 (L) 08/08/2023 0904   ALT 12 08/08/2023 0904   ALKPHOS 57 08/08/2023 0904   BILITOT 0.3 08/08/2023 0904   GFRNONAA >60 08/08/2023 0904   GFRNONAA >60 12/17/2012 1229   GFRAA >60 07/12/2020 0952   GFRAA >60 12/17/2012 1229    No results found for: "SPEP", "UPEP"  Lab Results  Component Value Date   WBC 11.8 (H) 08/08/2023   NEUTROABS 9.6 (H) 08/08/2023   HGB 11.6 (L) 08/08/2023   HCT 36.7 (L) 08/08/2023   MCV 91.1 08/08/2023   PLT 58 (L) 08/08/2023      Chemistry      Component Value Date/Time   NA 140 08/08/2023 0904   NA 140 12/17/2012 1229   K 3.9 08/08/2023 0904   K 4.4 12/17/2012 1229   CL 113 (H) 08/08/2023 0904   CL 106 12/17/2012 1229  CO2 22 08/08/2023 0904   CO2 26 12/17/2012 1229   BUN 24 (H) 08/08/2023 0904   BUN 19 (H) 12/17/2012 1229   CREATININE 1.02 08/08/2023 0904   CREATININE 1.05 12/17/2012 1229      Component Value Date/Time   CALCIUM 8.2 (L) 08/08/2023 0904   CALCIUM 8.4 (L)  12/17/2012 1229   ALKPHOS 57 08/08/2023 0904   AST 12 (L) 08/08/2023 0904   ALT 12 08/08/2023 0904   BILITOT 0.3 08/08/2023 0904       RADIOGRAPHIC STUDIES: I have personally reviewed the radiological images as listed and agreed with the findings in the report. No results found.   ASSESSMENT & PLAN:   Idiopathic thrombocytopenic purpura (HCC) #Chronic ITP-platelets stable between 60-70,000s/p rituxan in 2017.  Asymptomatic.   # Platelets today are 32- continue surveillance. Over all stable.   # As pt is asymptomatic- [poor response to steroids] monitor for Would recommend treatment with Rituxan /thrombopoietin stimulating agent if platelets < 50 or patient is bleeding.   # Hypocalcemia- 8.7- JUNE 2024- Vit D: 64-continue ca+vit D; will check at next visit.   # CAD - Asprin 81mg /day [Dr.Fath]  # DISPOSITION:  # in 3 months- lab- cbc;  # follow up in 6 months with MD/labs- cbc/cmp; Dr.B  # 25 minutes face-to-face with the patient discussing the above plan of care; more than 50% of time spent on prognosis/ natural history; counseling and coordination.      Earna Coder, MD 08/08/2023 9:47 AM

## 2023-08-08 NOTE — Addendum Note (Signed)
Addended by: Clydia Llano on: 08/08/2023 10:00 AM   Modules accepted: Orders

## 2023-08-08 NOTE — Progress Notes (Signed)
Patient has no new questions or concerns for the doctor today. He says that he is feeling good.

## 2023-08-23 DIAGNOSIS — I700 Atherosclerosis of aorta: Secondary | ICD-10-CM | POA: Diagnosis not present

## 2023-08-23 DIAGNOSIS — R42 Dizziness and giddiness: Secondary | ICD-10-CM | POA: Diagnosis not present

## 2023-08-23 DIAGNOSIS — I5022 Chronic systolic (congestive) heart failure: Secondary | ICD-10-CM | POA: Diagnosis not present

## 2023-08-23 DIAGNOSIS — D693 Immune thrombocytopenic purpura: Secondary | ICD-10-CM | POA: Diagnosis not present

## 2023-08-23 DIAGNOSIS — R062 Wheezing: Secondary | ICD-10-CM | POA: Diagnosis not present

## 2023-08-23 DIAGNOSIS — I10 Essential (primary) hypertension: Secondary | ICD-10-CM | POA: Diagnosis not present

## 2023-08-23 DIAGNOSIS — I480 Paroxysmal atrial fibrillation: Secondary | ICD-10-CM | POA: Diagnosis not present

## 2023-08-23 DIAGNOSIS — J22 Unspecified acute lower respiratory infection: Secondary | ICD-10-CM | POA: Diagnosis not present

## 2023-09-14 ENCOUNTER — Other Ambulatory Visit
Admission: RE | Admit: 2023-09-14 | Discharge: 2023-09-14 | Disposition: A | Discharge status: Home / Self Care | Payer: PPO | Source: Ambulatory Visit | Attending: Nurse Practitioner | Admitting: Nurse Practitioner

## 2023-09-14 DIAGNOSIS — I480 Paroxysmal atrial fibrillation: Secondary | ICD-10-CM | POA: Diagnosis not present

## 2023-09-14 DIAGNOSIS — R0609 Other forms of dyspnea: Secondary | ICD-10-CM | POA: Diagnosis not present

## 2023-09-14 DIAGNOSIS — I5022 Chronic systolic (congestive) heart failure: Secondary | ICD-10-CM | POA: Diagnosis not present

## 2023-09-14 DIAGNOSIS — I447 Left bundle-branch block, unspecified: Secondary | ICD-10-CM | POA: Diagnosis not present

## 2023-09-14 DIAGNOSIS — I700 Atherosclerosis of aorta: Secondary | ICD-10-CM | POA: Diagnosis not present

## 2023-09-14 DIAGNOSIS — E7849 Other hyperlipidemia: Secondary | ICD-10-CM | POA: Diagnosis not present

## 2023-09-14 DIAGNOSIS — I25118 Atherosclerotic heart disease of native coronary artery with other forms of angina pectoris: Secondary | ICD-10-CM | POA: Diagnosis not present

## 2023-09-14 DIAGNOSIS — I10 Essential (primary) hypertension: Secondary | ICD-10-CM | POA: Diagnosis not present

## 2023-09-14 DIAGNOSIS — I493 Ventricular premature depolarization: Secondary | ICD-10-CM | POA: Diagnosis not present

## 2023-09-14 LAB — BRAIN NATRIURETIC PEPTIDE: B Natriuretic Peptide: 555.3 pg/mL — ABNORMAL HIGH (ref 0.0–100.0)

## 2023-09-17 DIAGNOSIS — R42 Dizziness and giddiness: Secondary | ICD-10-CM | POA: Diagnosis not present

## 2023-09-28 DIAGNOSIS — Z8616 Personal history of COVID-19: Secondary | ICD-10-CM | POA: Diagnosis not present

## 2023-09-28 DIAGNOSIS — I5022 Chronic systolic (congestive) heart failure: Secondary | ICD-10-CM | POA: Diagnosis not present

## 2023-09-28 DIAGNOSIS — J019 Acute sinusitis, unspecified: Secondary | ICD-10-CM | POA: Diagnosis not present

## 2023-09-28 DIAGNOSIS — Z03818 Encounter for observation for suspected exposure to other biological agents ruled out: Secondary | ICD-10-CM | POA: Diagnosis not present

## 2023-09-28 DIAGNOSIS — J209 Acute bronchitis, unspecified: Secondary | ICD-10-CM | POA: Diagnosis not present

## 2023-09-28 DIAGNOSIS — B9689 Other specified bacterial agents as the cause of diseases classified elsewhere: Secondary | ICD-10-CM | POA: Diagnosis not present

## 2023-10-02 DIAGNOSIS — I25118 Atherosclerotic heart disease of native coronary artery with other forms of angina pectoris: Secondary | ICD-10-CM | POA: Diagnosis not present

## 2023-10-02 DIAGNOSIS — I5022 Chronic systolic (congestive) heart failure: Secondary | ICD-10-CM | POA: Diagnosis not present

## 2023-10-11 DIAGNOSIS — I5022 Chronic systolic (congestive) heart failure: Secondary | ICD-10-CM | POA: Diagnosis not present

## 2023-10-11 DIAGNOSIS — R931 Abnormal findings on diagnostic imaging of heart and coronary circulation: Secondary | ICD-10-CM | POA: Diagnosis not present

## 2023-10-11 DIAGNOSIS — I10 Essential (primary) hypertension: Secondary | ICD-10-CM | POA: Diagnosis not present

## 2023-10-11 DIAGNOSIS — R0609 Other forms of dyspnea: Secondary | ICD-10-CM | POA: Diagnosis not present

## 2023-10-11 DIAGNOSIS — I447 Left bundle-branch block, unspecified: Secondary | ICD-10-CM | POA: Diagnosis not present

## 2023-10-11 DIAGNOSIS — I480 Paroxysmal atrial fibrillation: Secondary | ICD-10-CM | POA: Diagnosis not present

## 2023-10-11 DIAGNOSIS — I2510 Atherosclerotic heart disease of native coronary artery without angina pectoris: Secondary | ICD-10-CM | POA: Diagnosis not present

## 2023-10-11 DIAGNOSIS — I493 Ventricular premature depolarization: Secondary | ICD-10-CM | POA: Diagnosis not present

## 2023-10-11 DIAGNOSIS — I700 Atherosclerosis of aorta: Secondary | ICD-10-CM | POA: Diagnosis not present

## 2023-10-11 DIAGNOSIS — E7849 Other hyperlipidemia: Secondary | ICD-10-CM | POA: Diagnosis not present

## 2023-10-12 ENCOUNTER — Ambulatory Visit: Payer: PPO | Admitting: Podiatry

## 2023-10-16 ENCOUNTER — Ambulatory Visit: Payer: PPO | Admitting: Podiatry

## 2023-10-16 ENCOUNTER — Encounter: Payer: Self-pay | Admitting: Podiatry

## 2023-10-16 DIAGNOSIS — B351 Tinea unguium: Secondary | ICD-10-CM

## 2023-10-16 DIAGNOSIS — M79674 Pain in right toe(s): Secondary | ICD-10-CM

## 2023-10-16 DIAGNOSIS — M79675 Pain in left toe(s): Secondary | ICD-10-CM

## 2023-10-16 NOTE — Progress Notes (Signed)
   Chief Complaint  Patient presents with   Nail Problem    "Trim my toenails."    SUBJECTIVE Patient presents to office today complaining of elongated, thickened nails that cause pain while ambulating in shoes.  Patient is unable to trim their own nails. Patient is here for further evaluation and treatment.  Past Medical History:  Diagnosis Date   CAD (coronary artery disease)    Cancer (HCC)    melanoma   CHF (congestive heart failure) (HCC)systolic    2017 with pneumonia   Colon polyps    GERD (gastroesophageal reflux disease)    Gout    Hyperlipidemia    Hypertension    IDA (iron deficiency anemia)    Kidney stones    MI (myocardial infarction) (HCC) 2004   Mild anemia    Paroxysmal atrial fibrillation (HCC)    PONV (postoperative nausea and vomiting)    Thrombocytopenia (HCC)    followed by oncology and PCP   Wears hearing aid in both ears     Allergies  Allergen Reactions   Penicillins Swelling    Has patient had a PCN reaction causing immediate rash, facial/tongue/throat swelling, SOB or lightheadedness with hypotension: Yes Has patient had a PCN reaction causing severe rash involving mucus membranes or skin necrosis: No Has patient had a PCN reaction that required hospitalization No Has patient had a PCN reaction occurring within the last 10 years: No If all of the above answers are "NO", then may proceed with Cephalosporin use.      OBJECTIVE General Patient is awake, alert, and oriented x 3 and in no acute distress. Derm Skin is dry and supple bilateral. Negative open lesions or macerations. Remaining integument unremarkable. Nails are tender, long, thickened and dystrophic with subungual debris, consistent with onychomycosis, 1-5 bilateral. No signs of infection noted. Vasc  DP and PT pedal pulses palpable bilaterally. Temperature gradient within normal limits.  Neuro Epicritic and protective threshold sensation grossly intact bilaterally.  Musculoskeletal  Exam No symptomatic pedal deformities noted bilateral. Muscular strength within normal limits.  ASSESSMENT 1.  Pain due to onychomycosis of toenails both  PLAN OF CARE 1. Patient evaluated today.  2. Instructed to maintain good pedal hygiene and foot care.  3. Mechanical debridement of nails 1-5 bilaterally performed using a nail nipper. Filed with dremel without incident.  4. Return to clinic in 3 mos.    Felecia Shelling, DPM [facility]  Dr. Felecia Shelling, DPM    [address] [place], [place] [address]                Office [phone]  Fax [phone]

## 2023-10-17 DIAGNOSIS — E785 Hyperlipidemia, unspecified: Secondary | ICD-10-CM | POA: Diagnosis not present

## 2023-10-17 DIAGNOSIS — D693 Immune thrombocytopenic purpura: Secondary | ICD-10-CM | POA: Diagnosis not present

## 2023-10-17 DIAGNOSIS — M1A00X0 Idiopathic chronic gout, unspecified site, without tophus (tophi): Secondary | ICD-10-CM | POA: Diagnosis not present

## 2023-10-17 DIAGNOSIS — I10 Essential (primary) hypertension: Secondary | ICD-10-CM | POA: Diagnosis not present

## 2023-10-22 DIAGNOSIS — Z961 Presence of intraocular lens: Secondary | ICD-10-CM | POA: Diagnosis not present

## 2023-10-22 DIAGNOSIS — H353132 Nonexudative age-related macular degeneration, bilateral, intermediate dry stage: Secondary | ICD-10-CM | POA: Diagnosis not present

## 2023-10-22 DIAGNOSIS — H35372 Puckering of macula, left eye: Secondary | ICD-10-CM | POA: Diagnosis not present

## 2023-10-24 ENCOUNTER — Other Ambulatory Visit: Payer: Self-pay | Admitting: *Deleted

## 2023-10-24 ENCOUNTER — Telehealth: Payer: Self-pay | Admitting: Internal Medicine

## 2023-10-24 ENCOUNTER — Telehealth: Payer: Self-pay | Admitting: *Deleted

## 2023-10-24 ENCOUNTER — Inpatient Hospital Stay: Payer: PPO | Attending: Internal Medicine

## 2023-10-24 DIAGNOSIS — D693 Immune thrombocytopenic purpura: Secondary | ICD-10-CM | POA: Diagnosis not present

## 2023-10-24 LAB — CMP (CANCER CENTER ONLY)
ALT: 14 U/L (ref 0–44)
AST: 13 U/L — ABNORMAL LOW (ref 15–41)
Albumin: 4.5 g/dL (ref 3.5–5.0)
Alkaline Phosphatase: 59 U/L (ref 38–126)
Anion gap: 10 (ref 5–15)
BUN: 35 mg/dL — ABNORMAL HIGH (ref 8–23)
CO2: 27 mmol/L (ref 22–32)
Calcium: 9.2 mg/dL (ref 8.9–10.3)
Chloride: 108 mmol/L (ref 98–111)
Creatinine: 1.17 mg/dL (ref 0.61–1.24)
GFR, Estimated: >60 mL/min (ref >60)
Glucose, Bld: 109 mg/dL — ABNORMAL HIGH (ref 70–99)
Potassium: 4.9 mmol/L (ref 3.5–5.1)
Sodium: 145 mmol/L (ref 135–145)
Total Bilirubin: 0.6 mg/dL (ref <1.2)
Total Protein: 7.5 g/dL (ref 6.5–8.1)

## 2023-10-24 LAB — CBC WITH DIFFERENTIAL (CANCER CENTER ONLY)
Abs Immature Granulocytes: 0.12 10*3/uL — ABNORMAL HIGH (ref 0.00–0.07)
Basophils Absolute: 0.1 10*3/uL (ref 0.0–0.1)
Basophils Relative: 0 %
Eosinophils Absolute: 0.4 10*3/uL (ref 0.0–0.5)
Eosinophils Relative: 3 %
HCT: 41.3 % (ref 39.0–52.0)
Hemoglobin: 12.7 g/dL — ABNORMAL LOW (ref 13.0–17.0)
Immature Granulocytes: 1 %
Lymphocytes Relative: 7 %
Lymphs Abs: 0.9 10*3/uL (ref 0.7–4.0)
MCH: 28.5 pg (ref 26.0–34.0)
MCHC: 30.8 g/dL (ref 30.0–36.0)
MCV: 92.6 fL (ref 80.0–100.0)
Monocytes Absolute: 0.9 10*3/uL (ref 0.1–1.0)
Monocytes Relative: 7 %
Neutro Abs: 11.8 10*3/uL — ABNORMAL HIGH (ref 1.7–7.7)
Neutrophils Relative %: 82 %
Platelet Count: 82 10*3/uL — ABNORMAL LOW (ref 150–400)
RBC: 4.46 MIL/uL (ref 4.22–5.81)
RDW: 14.3 % (ref 11.5–15.5)
WBC Count: 14.2 10*3/uL — ABNORMAL HIGH (ref 4.0–10.5)
nRBC: 0 % (ref 0.0–0.2)

## 2023-10-24 LAB — PROTIME-INR
INR: 1.1 (ref 0.8–1.2)
Prothrombin Time: 13.9 s (ref 11.4–15.2)

## 2023-10-24 LAB — APTT: aPTT: 29 s (ref 24–36)

## 2023-10-24 NOTE — Telephone Encounter (Signed)
Pt noted to bleeding post dental cleaning.  Patient status post cauterization.  Currently resolved.  Patient is 84/ITP-on surveillance..  Recommend HOLDING- asprin for 3-4 days.    Recommend follow-up as planned.  No new recommendations.   Talked to pt's daugter, KIm/and patient. GB

## 2023-10-24 NOTE — Telephone Encounter (Signed)
Kim called reporting that patient is having prolonged bleeding gums after a dental procedure yesterday.He had a routine cleaning and the hygienists got to aggressive and slipped and cut his gum. He had bright red blood soaked into his pillow just a small spot. She is asking if he should come in to be seen. Since he is still oozing blood. Please advise  Last platelet count 11/20 was 65

## 2023-10-24 NOTE — Telephone Encounter (Signed)
Levi Figueroa called back and states that there were 3 spots on his pillow all together the size of his fist.  He has been having some clots from his mouth also.

## 2023-10-24 NOTE — Telephone Encounter (Signed)
I spoke with Levi Figueroa  and she will have patient there in 40 minutes. She will also call the dentist to inform him of what is going on

## 2023-10-29 DIAGNOSIS — H8112 Benign paroxysmal vertigo, left ear: Secondary | ICD-10-CM | POA: Diagnosis not present

## 2023-11-05 ENCOUNTER — Telehealth: Payer: Self-pay | Admitting: *Deleted

## 2023-11-05 NOTE — Telephone Encounter (Signed)
Patient called reporting that he has an appointment for lab, but he just hd labs drawn 11/27, he is asking if he still needs this appointment Wednesday or can it be cancelled. Please advise

## 2023-11-06 DIAGNOSIS — H8112 Benign paroxysmal vertigo, left ear: Secondary | ICD-10-CM | POA: Diagnosis not present

## 2023-11-07 ENCOUNTER — Inpatient Hospital Stay: Payer: PPO | Attending: Internal Medicine

## 2023-11-07 DIAGNOSIS — H0015 Chalazion left lower eyelid: Secondary | ICD-10-CM | POA: Diagnosis not present

## 2023-11-13 DIAGNOSIS — I5022 Chronic systolic (congestive) heart failure: Secondary | ICD-10-CM | POA: Diagnosis not present

## 2023-11-14 DIAGNOSIS — I25118 Atherosclerotic heart disease of native coronary artery with other forms of angina pectoris: Secondary | ICD-10-CM | POA: Diagnosis not present

## 2023-11-14 DIAGNOSIS — I10 Essential (primary) hypertension: Secondary | ICD-10-CM | POA: Diagnosis not present

## 2023-11-14 DIAGNOSIS — I480 Paroxysmal atrial fibrillation: Secondary | ICD-10-CM | POA: Diagnosis not present

## 2023-11-14 DIAGNOSIS — Z0000 Encounter for general adult medical examination without abnormal findings: Secondary | ICD-10-CM | POA: Diagnosis not present

## 2023-11-14 DIAGNOSIS — D693 Immune thrombocytopenic purpura: Secondary | ICD-10-CM | POA: Diagnosis not present

## 2023-11-14 DIAGNOSIS — I700 Atherosclerosis of aorta: Secondary | ICD-10-CM | POA: Diagnosis not present

## 2023-11-14 DIAGNOSIS — K219 Gastro-esophageal reflux disease without esophagitis: Secondary | ICD-10-CM | POA: Diagnosis not present

## 2023-11-14 DIAGNOSIS — D692 Other nonthrombocytopenic purpura: Secondary | ICD-10-CM | POA: Diagnosis not present

## 2023-11-14 DIAGNOSIS — I5022 Chronic systolic (congestive) heart failure: Secondary | ICD-10-CM | POA: Diagnosis not present

## 2023-11-14 DIAGNOSIS — N400 Enlarged prostate without lower urinary tract symptoms: Secondary | ICD-10-CM | POA: Diagnosis not present

## 2023-11-14 DIAGNOSIS — M1000 Idiopathic gout, unspecified site: Secondary | ICD-10-CM | POA: Diagnosis not present

## 2023-11-14 DIAGNOSIS — E7849 Other hyperlipidemia: Secondary | ICD-10-CM | POA: Diagnosis not present

## 2023-11-14 DIAGNOSIS — M25552 Pain in left hip: Secondary | ICD-10-CM | POA: Diagnosis not present

## 2023-11-14 DIAGNOSIS — M160 Bilateral primary osteoarthritis of hip: Secondary | ICD-10-CM | POA: Diagnosis not present

## 2023-11-14 DIAGNOSIS — M25551 Pain in right hip: Secondary | ICD-10-CM | POA: Diagnosis not present

## 2023-11-27 DIAGNOSIS — H0015 Chalazion left lower eyelid: Secondary | ICD-10-CM | POA: Diagnosis not present

## 2023-12-04 DIAGNOSIS — I447 Left bundle-branch block, unspecified: Secondary | ICD-10-CM | POA: Diagnosis not present

## 2023-12-04 DIAGNOSIS — I5022 Chronic systolic (congestive) heart failure: Secondary | ICD-10-CM | POA: Diagnosis not present

## 2023-12-20 DIAGNOSIS — I493 Ventricular premature depolarization: Secondary | ICD-10-CM | POA: Diagnosis not present

## 2023-12-20 DIAGNOSIS — E7849 Other hyperlipidemia: Secondary | ICD-10-CM | POA: Diagnosis not present

## 2023-12-20 DIAGNOSIS — I447 Left bundle-branch block, unspecified: Secondary | ICD-10-CM | POA: Diagnosis not present

## 2023-12-20 DIAGNOSIS — I10 Essential (primary) hypertension: Secondary | ICD-10-CM | POA: Diagnosis not present

## 2023-12-20 DIAGNOSIS — I480 Paroxysmal atrial fibrillation: Secondary | ICD-10-CM | POA: Diagnosis not present

## 2023-12-20 DIAGNOSIS — I25118 Atherosclerotic heart disease of native coronary artery with other forms of angina pectoris: Secondary | ICD-10-CM | POA: Diagnosis not present

## 2023-12-20 DIAGNOSIS — I5022 Chronic systolic (congestive) heart failure: Secondary | ICD-10-CM | POA: Diagnosis not present

## 2024-01-10 DIAGNOSIS — I10 Essential (primary) hypertension: Secondary | ICD-10-CM | POA: Diagnosis not present

## 2024-01-10 DIAGNOSIS — I5022 Chronic systolic (congestive) heart failure: Secondary | ICD-10-CM | POA: Diagnosis not present

## 2024-01-10 DIAGNOSIS — I480 Paroxysmal atrial fibrillation: Secondary | ICD-10-CM | POA: Diagnosis not present

## 2024-01-10 DIAGNOSIS — I25118 Atherosclerotic heart disease of native coronary artery with other forms of angina pectoris: Secondary | ICD-10-CM | POA: Diagnosis not present

## 2024-01-18 ENCOUNTER — Ambulatory Visit: Payer: PPO | Admitting: Podiatry

## 2024-01-29 ENCOUNTER — Ambulatory Visit: Payer: PPO | Admitting: Podiatry

## 2024-01-29 ENCOUNTER — Encounter: Payer: Self-pay | Admitting: Podiatry

## 2024-01-29 VITALS — Ht 70.0 in | Wt 183.0 lb

## 2024-01-29 DIAGNOSIS — M79674 Pain in right toe(s): Secondary | ICD-10-CM | POA: Diagnosis not present

## 2024-01-29 DIAGNOSIS — M79675 Pain in left toe(s): Secondary | ICD-10-CM

## 2024-01-29 DIAGNOSIS — B351 Tinea unguium: Secondary | ICD-10-CM

## 2024-01-29 NOTE — Progress Notes (Signed)
   No chief complaint on file.   SUBJECTIVE Patient presents to office today complaining of elongated, thickened nails that cause pain while ambulating in shoes.  Patient is unable to trim their own nails. Patient is here for further evaluation and treatment.  Past Medical History:  Diagnosis Date   CAD (coronary artery disease)    Cancer (HCC)    melanoma   CHF (congestive heart failure) (HCC)systolic    2017 with pneumonia   Colon polyps    GERD (gastroesophageal reflux disease)    Gout    Hyperlipidemia    Hypertension    IDA (iron deficiency anemia)    Kidney stones    MI (myocardial infarction) (HCC) 2004   Mild anemia    Paroxysmal atrial fibrillation (HCC)    PONV (postoperative nausea and vomiting)    Thrombocytopenia (HCC)    followed by oncology and PCP   Wears hearing aid in both ears     Allergies  Allergen Reactions   Penicillins Swelling    Has patient had a PCN reaction causing immediate rash, facial/tongue/throat swelling, SOB or lightheadedness with hypotension: Yes Has patient had a PCN reaction causing severe rash involving mucus membranes or skin necrosis: No Has patient had a PCN reaction that required hospitalization No Has patient had a PCN reaction occurring within the last 10 years: No If all of the above answers are "NO", then may proceed with Cephalosporin use.      OBJECTIVE General Patient is awake, alert, and oriented x 3 and in no acute distress. Derm Skin is dry and supple bilateral. Negative open lesions or macerations. Remaining integument unremarkable. Nails are tender, long, thickened and dystrophic with subungual debris, consistent with onychomycosis, 1-5 bilateral. No signs of infection noted. Vasc  DP and PT pedal pulses palpable bilaterally. Temperature gradient within normal limits.  Neuro Epicritic and protective threshold sensation grossly intact bilaterally.  Musculoskeletal Exam No symptomatic pedal deformities noted  bilateral. Muscular strength within normal limits.  ASSESSMENT 1.  Pain due to onychomycosis of toenails both  PLAN OF CARE 1. Patient evaluated today.  2. Instructed to maintain good pedal hygiene and foot care.  3. Mechanical debridement of nails 1-5 bilaterally performed using a nail nipper. Filed with dremel without incident.  4. Return to clinic in 3 mos.    Felecia Shelling, DPM [facility]  Dr. Felecia Shelling, DPM    [address] [place], [place] [address]                Office [phone]  Fax [phone]

## 2024-02-05 ENCOUNTER — Inpatient Hospital Stay: Payer: PPO | Attending: Internal Medicine

## 2024-02-05 ENCOUNTER — Inpatient Hospital Stay: Payer: PPO | Admitting: Internal Medicine

## 2024-02-05 ENCOUNTER — Encounter: Payer: Self-pay | Admitting: Internal Medicine

## 2024-02-05 VITALS — BP 121/53 | HR 64 | Temp 98.2°F | Resp 20 | Ht 70.0 in | Wt 181.6 lb

## 2024-02-05 DIAGNOSIS — I2510 Atherosclerotic heart disease of native coronary artery without angina pectoris: Secondary | ICD-10-CM | POA: Diagnosis not present

## 2024-02-05 DIAGNOSIS — Z803 Family history of malignant neoplasm of breast: Secondary | ICD-10-CM | POA: Diagnosis not present

## 2024-02-05 DIAGNOSIS — D693 Immune thrombocytopenic purpura: Secondary | ICD-10-CM | POA: Insufficient documentation

## 2024-02-05 DIAGNOSIS — Z87891 Personal history of nicotine dependence: Secondary | ICD-10-CM | POA: Insufficient documentation

## 2024-02-05 LAB — CMP (CANCER CENTER ONLY)
ALT: 12 U/L (ref 0–44)
AST: 13 U/L — ABNORMAL LOW (ref 15–41)
Albumin: 3.9 g/dL (ref 3.5–5.0)
Alkaline Phosphatase: 61 U/L (ref 38–126)
Anion gap: 5 (ref 5–15)
BUN: 27 mg/dL — ABNORMAL HIGH (ref 8–23)
CO2: 27 mmol/L (ref 22–32)
Calcium: 9.3 mg/dL (ref 8.9–10.3)
Chloride: 109 mmol/L (ref 98–111)
Creatinine: 1.11 mg/dL (ref 0.61–1.24)
GFR, Estimated: >60 mL/min (ref >60)
Glucose, Bld: 109 mg/dL — ABNORMAL HIGH (ref 70–99)
Potassium: 5.1 mmol/L (ref 3.5–5.1)
Sodium: 141 mmol/L (ref 135–145)
Total Bilirubin: 0.7 mg/dL (ref 0.0–1.2)
Total Protein: 7.1 g/dL (ref 6.5–8.1)

## 2024-02-05 LAB — CBC WITH DIFFERENTIAL (CANCER CENTER ONLY)
Abs Immature Granulocytes: 0.08 10*3/uL — ABNORMAL HIGH (ref 0.00–0.07)
Basophils Absolute: 0.1 10*3/uL (ref 0.0–0.1)
Basophils Relative: 0 %
Eosinophils Absolute: 0.4 10*3/uL (ref 0.0–0.5)
Eosinophils Relative: 3 %
HCT: 41.3 % (ref 39.0–52.0)
Hemoglobin: 12.9 g/dL — ABNORMAL LOW (ref 13.0–17.0)
Immature Granulocytes: 1 %
Lymphocytes Relative: 6 %
Lymphs Abs: 0.8 10*3/uL (ref 0.7–4.0)
MCH: 27.6 pg (ref 26.0–34.0)
MCHC: 31.2 g/dL (ref 30.0–36.0)
MCV: 88.2 fL (ref 80.0–100.0)
Monocytes Absolute: 0.9 10*3/uL (ref 0.1–1.0)
Monocytes Relative: 7 %
Neutro Abs: 11.2 10*3/uL — ABNORMAL HIGH (ref 1.7–7.7)
Neutrophils Relative %: 83 %
Platelet Count: 55 10*3/uL — ABNORMAL LOW (ref 150–400)
RBC: 4.68 MIL/uL (ref 4.22–5.81)
RDW: 15.5 % (ref 11.5–15.5)
WBC Count: 13.5 10*3/uL — ABNORMAL HIGH (ref 4.0–10.5)
nRBC: 0 % (ref 0.0–0.2)

## 2024-02-05 NOTE — Progress Notes (Signed)
 Seeing Dr. Jenne Campus tomorrow for vertigo.  New rx jardiance.

## 2024-02-05 NOTE — Progress Notes (Signed)
 [facility] OFFICE PROGRESS NOTE  Patient Care Team: Lynnea Ferrier, MD as PCP - General (Internal Medicine) Earna Coder, MD as Consulting Physician (Oncology)   SUMMARY OF ONCOLOGIC HISTORY:  2012- CHRONIC ITP-[ BMBx; 2012- hypercellular 60%; megakaryocytes/no dyspoiesis; FISH- Neg; Dr.Pandit]; [place]- Abdo-Neg for spleen/liver; HIV/hepatitis-NEG; OCT 2017- Rituxan weekly x4  # July 2017- Melanoma s/p excision [? Stage; Dr.Dasher]; status post lithotripsy [Dr. Lonna Cobb; 2019]; 2019-CT scan incidental liver lesion; likely hemangioma [Jan 2020 CT scan]; 2021- COVID  INTERVAL HISTORY: Ambulating independently.  Accompanied by his wife.  89 year old male patient with a history of ITP-currently on asprin surveillance is here for follow-up.  Past November , 2024  pt noted to bleeding post dental cleaning.  Patient status post cauterization.  Currently resolved.    Denies any bleeding or bruising. Bowels normal. Otherwise denies any new symptoms. No blood in stools or black-colored stools.  Currently awaiting evaluation with Dr.McQueen for vertigo.   Review of Systems  Constitutional: Negative.  Negative for chills, diaphoresis, fever, malaise/fatigue and weight loss.  HENT: Negative.  Negative for nosebleeds and sore throat.   Eyes: Negative.  Negative for double vision.  Respiratory: Negative.  Negative for cough, hemoptysis, sputum production, shortness of breath and wheezing.   Cardiovascular: Negative.  Negative for chest pain, palpitations, orthopnea and leg swelling.  Gastrointestinal: Negative.  Negative for abdominal pain, blood in stool, constipation, diarrhea, heartburn, melena, nausea and vomiting.  Genitourinary: Negative.  Negative for dysuria, frequency and urgency.  Musculoskeletal: Negative.  Negative for back pain and joint pain.  Skin: Negative.  Negative for itching and rash.  Neurological: Negative.  Negative for dizziness, tingling, focal  weakness, weakness and headaches.  Endo/Heme/Allergies:  Bruises/bleeds easily.  Psychiatric/Behavioral: Negative.  Negative for depression. The patient is not nervous/anxious and does not have insomnia.      PAST MEDICAL HISTORY :  Past Medical History:  Diagnosis Date   CAD (coronary artery disease)    Cancer (HCC)    melanoma   CHF (congestive heart failure) (HCC)systolic    2017 with pneumonia   Colon polyps    GERD (gastroesophageal reflux disease)    Gout    Hyperlipidemia    Hypertension    IDA (iron deficiency anemia)    Kidney stones    MI (myocardial infarction) (HCC) 2004   Mild anemia    Paroxysmal atrial fibrillation (HCC)    PONV (postoperative nausea and vomiting)    Thrombocytopenia (HCC)    followed by oncology and PCP   Wears hearing aid in both ears     PAST SURGICAL HISTORY :   Past Surgical History:  Procedure Laterality Date   BLADDER SURGERY     CARDIAC CATHETERIZATION     CATARACT EXTRACTION W/PHACO Right 02/22/2022   Procedure: CATARACT EXTRACTION PHACO AND INTRAOCULAR LENS PLACEMENT (IOC) RIGHT 11.87 01:17.3;  Surgeon: Lockie Mola, MD;  Location: [facility];  Service: Ophthalmology;  Laterality: Right;   CATARACT EXTRACTION W/PHACO Left 03/08/2022   Procedure: CATARACT EXTRACTION PHACO AND INTRAOCULAR LENS PLACEMENT (IOC)LEFT MiLOOP 4.40 01:01.5;  Surgeon: Lockie Mola, MD;  Location: [facility];  Service: Ophthalmology;  Laterality: Left;   CORONARY ARTERY BYPASS GRAFT  2004   5 vessel   CYSTOSCOPY WITH LITHOLAPAXY N/A 07/05/2018   Procedure: CYSTOSCOPY WITH LITHOLAPAXY;  Surgeon: Riki Altes, MD;  Location: [facility];  Service: Urology;  Laterality: N/A;   HOLMIUM LASER APPLICATION N/A 07/05/2018   Procedure: HOLMIUM LASER APPLICATION;  Surgeon: Riki Altes, MD;  Location: [facility];  Service: Urology;  Laterality: N/A;   INGUINAL HERNIA REPAIR Right    LASER ABLATION     x 2 prostatic hypertrophy    TONSILLECTOMY      FAMILY HISTORY :   Family History  Problem Relation Age of Onset   Breast cancer Mother    Diabetes Father    Heart disease Father    Heart disease Brother    Heart disease Brother    Prostate cancer Neg Hx    Bladder Cancer Neg Hx    Kidney cancer Neg Hx     SOCIAL HISTORY:   Social History   Tobacco Use   Smoking status: Former    Current packs/day: 0.00    Types: Cigarettes    Quit date: 1963    Years since quitting: 62.2   Smokeless tobacco: Never  Vaping Use   Vaping status: Never Used  Substance Use Topics   Alcohol use: No    Alcohol/week: 0.0 standard drinks of alcohol   Drug use: No    ALLERGIES:  is allergic to penicillins.  MEDICATIONS:  Current Outpatient Medications  Medication Sig Dispense Refill   acetaminophen (TYLENOL) 500 MG tablet Take 500 mg by mouth every 6 (six) hours as needed.     albuterol (VENTOLIN HFA) 108 (90 Base) MCG/ACT inhaler Inhale 1-2 puffs into the lungs every 4 (four) hours as needed for wheezing or shortness of breath. 18 g 0   aspirin EC 81 MG tablet Take 1 tablet (81 mg total) by mouth daily. Swallow whole. 30 tablet 12   atorvastatin (LIPITOR) 40 MG tablet Take 1 tablet (40 mg total) by mouth daily at 2 PM. 30 tablet 0   cetirizine (ZYRTEC) 10 MG tablet Take 10 mg by mouth daily.     Cholecalciferol (VITAMIN D-3 PO) Take by mouth.     furosemide (LASIX) 20 MG tablet Take to 1 tablet (20 mg total) by mouth ONCE OR TWICE daily (total daily dose maximum 40 mg) as needed for up to 3 days for increased leg swelling, shortness of breath, weight gain 5+ lbs over 1-2 days. Seek medical care if these symptoms are not improving with increased dose. 30 tablet 11   ipratropium (ATROVENT) 0.06 % nasal spray Place 2 sprays into both nostrils every 8 (eight) hours.     isosorbide mononitrate (IMDUR) 30 MG 24 hr tablet Take 1 tablet (30 mg total) by mouth daily. 30 tablet 0   JARDIANCE 10 MG TABS tablet Take 10 mg by mouth  daily.     lisinopril (ZESTRIL) 10 MG tablet Take 10 mg by mouth daily.     magnesium oxide (MAG-OX) 400 MG tablet magnesium oxide 400 mg (241.3 mg magnesium) tablet  TAKE 1 TABLET BY MOUTH ONCE DAILY     metoprolol tartrate 75 MG TABS Take 75 mg by mouth 2 (two) times daily. (Patient taking differently: Take 50 mg by mouth 2 (two) times daily.) 60 tablet 0   Multiple Vitamins-Minerals (PRESERVISION AREDS 2 PO) Take 1 tablet by mouth 2 (two) times daily.     nitroGLYCERIN (NITROSTAT) 0.4 MG SL tablet Place 1 tablet (0.4 mg total) under the tongue every 5 (five) minutes as needed for chest pain. 30 tablet 0   Omega-3 Fatty Acids (FISH OIL) 1200 MG CAPS Take 1,200 mg by mouth daily.     omeprazole (PRILOSEC) 20 MG capsule Take 20 mg by mouth daily.  psyllium (METAMUCIL) 58.6 % powder Take 1 packet by mouth at bedtime.     triamcinolone (KENALOG) 0.1 % triamcinolone acetonide 0.1 % topical cream  APPLY CREAM EXTERNALLY TWICE DAILY TO AFFECTED AREA AS NEEDED     vitamin B-12 (CYANOCOBALAMIN) 1000 MCG tablet Take 1,000 mcg by mouth daily.     No current facility-administered medications for this visit.    PHYSICAL EXAMINATION:   BP (!) 121/53 (BP Location: Left Arm, Patient Position: Sitting, Cuff Size: Large)   Pulse 64   Temp 98.2 F (36.8 C)   Resp 20   Ht 5\' 10"  (1.778 m)   Wt 181 lb 9.6 oz (82.4 kg)   SpO2 99%   BMI 26.06 kg/m   Filed Weights   02/05/24 1002  Weight: 181 lb 9.6 oz (82.4 kg)    Physical Exam Constitutional:      Comments: He is alone.  HENT:     Head: Normocephalic and atraumatic.     Mouth/Throat:     Pharynx: No oropharyngeal exudate.  Eyes:     Pupils: Pupils are equal, round, and reactive to light.  Cardiovascular:     Rate and Rhythm: Normal rate and regular rhythm.  Pulmonary:     Effort: No respiratory distress.     Breath sounds: No wheezing.  Abdominal:     General: Bowel sounds are normal. There is no distension.     Palpations: Abdomen  is soft. There is no mass.     Tenderness: There is no abdominal tenderness. There is no guarding or rebound.  Musculoskeletal:        General: No tenderness. Normal range of motion.     Cervical back: Normal range of motion and neck supple.  Skin:    General: Skin is warm.  Neurological:     Mental Status: He is alert and oriented to person, place, and time.  Psychiatric:        Mood and Affect: Affect normal.     LABORATORY DATA:  I have reviewed the data as listed    Component Value Date/Time   NA 141 02/05/2024 1000   NA 140 12/17/2012 1229   K 5.1 02/05/2024 1000   K 4.4 12/17/2012 1229   CL 109 02/05/2024 1000   CL 106 12/17/2012 1229   CO2 27 02/05/2024 1000   CO2 26 12/17/2012 1229   GLUCOSE 109 (H) 02/05/2024 1000   GLUCOSE 77 12/17/2012 1229   BUN 27 (H) 02/05/2024 1000   BUN 19 (H) 12/17/2012 1229   CREATININE 1.11 02/05/2024 1000   CREATININE 1.05 12/17/2012 1229   CALCIUM 9.3 02/05/2024 1000   CALCIUM 8.4 (L) 12/17/2012 1229   PROT 7.1 02/05/2024 1000   ALBUMIN 3.9 02/05/2024 1000   AST 13 (L) 02/05/2024 1000   ALT 12 02/05/2024 1000   ALKPHOS 61 02/05/2024 1000   BILITOT 0.7 02/05/2024 1000   GFRNONAA >60 02/05/2024 1000   GFRNONAA >60 12/17/2012 1229   GFRAA >60 07/12/2020 0952   GFRAA >60 12/17/2012 1229    No results found for: "SPEP", "UPEP"  Lab Results  Component Value Date   WBC 13.5 (H) 02/05/2024   NEUTROABS 11.2 (H) 02/05/2024   HGB 12.9 (L) 02/05/2024   HCT 41.3 02/05/2024   MCV 88.2 02/05/2024   PLT 55 (L) 02/05/2024      Chemistry      Component Value Date/Time   NA 141 02/05/2024 1000   NA 140 12/17/2012 1229   K 5.1  02/05/2024 1000   K 4.4 12/17/2012 1229   CL 109 02/05/2024 1000   CL 106 12/17/2012 1229   CO2 27 02/05/2024 1000   CO2 26 12/17/2012 1229   BUN 27 (H) 02/05/2024 1000   BUN 19 (H) 12/17/2012 1229   CREATININE 1.11 02/05/2024 1000   CREATININE 1.05 12/17/2012 1229      Component Value Date/Time    CALCIUM 9.3 02/05/2024 1000   CALCIUM 8.4 (L) 12/17/2012 1229   ALKPHOS 61 02/05/2024 1000   AST 13 (L) 02/05/2024 1000   ALT 12 02/05/2024 1000   BILITOT 0.7 02/05/2024 1000       RADIOGRAPHIC STUDIES: I have personally reviewed the radiological images as listed and agreed with the findings in the report. No results found.   ASSESSMENT & PLAN:   Idiopathic thrombocytopenic purpura (HCC) #Chronic ITP-platelets stable between 60-70,000s/p rituxan in 2017.  Asymptomatic.   # Platelets today are 55- continue surveillance. Over all stable.   # As pt is asymptomatic- [poor response to steroids] monitor for Would recommend treatment with Rituxan /thrombopoietin stimulating agent if platelets < 50 or patient is bleeding. Recommend HOLDING Asprin 7 days prior to dental cleaning or any procedure.   # Hypocalcemia- 8.7- JUNE 2024- Vit D: 64-continue ca+vit D-  # CAD - Asprin 81mg /day [Dr.Fath]  # Discussed re: avoiding falls- Dr.McQueen for vertigo.   # DISPOSITION:  # in 3 months- lab- cbc;  # follow up in 6 months with MD/labs- cbc/cmp; Dr.B      Earna Coder, MD 02/05/2024 11:10 AM

## 2024-02-05 NOTE — Assessment & Plan Note (Signed)
#  Chronic ITP-platelets stable between 60-70,000s/p rituxan in 2017.  Asymptomatic.   # Platelets today are 55- continue surveillance. Over all stable.   # As pt is asymptomatic- [poor response to steroids] monitor for Would recommend treatment with Rituxan /thrombopoietin stimulating agent if platelets < 50 or patient is bleeding. Recommend HOLDING Asprin 7 days prior to dental cleaning or any procedure.   # Hypocalcemia- 8.7- JUNE 2024- Vit D: 64-continue ca+vit D-  # CAD - Asprin 81mg /day [Dr.Fath]  # Discussed re: avoiding falls- Dr.McQueen for vertigo.   # DISPOSITION:  # in 3 months- lab- cbc;  # follow up in 6 months with MD/labs- cbc/cmp; Dr.B

## 2024-02-06 DIAGNOSIS — H8112 Benign paroxysmal vertigo, left ear: Secondary | ICD-10-CM | POA: Diagnosis not present

## 2024-02-13 DIAGNOSIS — H8112 Benign paroxysmal vertigo, left ear: Secondary | ICD-10-CM | POA: Diagnosis not present

## 2024-03-10 DIAGNOSIS — H8112 Benign paroxysmal vertigo, left ear: Secondary | ICD-10-CM | POA: Diagnosis not present

## 2024-03-19 DIAGNOSIS — H8112 Benign paroxysmal vertigo, left ear: Secondary | ICD-10-CM | POA: Diagnosis not present

## 2024-03-19 DIAGNOSIS — R2681 Unsteadiness on feet: Secondary | ICD-10-CM | POA: Diagnosis not present

## 2024-03-21 DIAGNOSIS — R2681 Unsteadiness on feet: Secondary | ICD-10-CM | POA: Diagnosis not present

## 2024-03-21 DIAGNOSIS — H8112 Benign paroxysmal vertigo, left ear: Secondary | ICD-10-CM | POA: Diagnosis not present

## 2024-04-01 DIAGNOSIS — Z85820 Personal history of malignant melanoma of skin: Secondary | ICD-10-CM | POA: Diagnosis not present

## 2024-04-01 DIAGNOSIS — D2272 Melanocytic nevi of left lower limb, including hip: Secondary | ICD-10-CM | POA: Diagnosis not present

## 2024-04-01 DIAGNOSIS — D2262 Melanocytic nevi of left upper limb, including shoulder: Secondary | ICD-10-CM | POA: Diagnosis not present

## 2024-04-01 DIAGNOSIS — Z85828 Personal history of other malignant neoplasm of skin: Secondary | ICD-10-CM | POA: Diagnosis not present

## 2024-04-01 DIAGNOSIS — L570 Actinic keratosis: Secondary | ICD-10-CM | POA: Diagnosis not present

## 2024-04-01 DIAGNOSIS — D2261 Melanocytic nevi of right upper limb, including shoulder: Secondary | ICD-10-CM | POA: Diagnosis not present

## 2024-04-01 DIAGNOSIS — D2271 Melanocytic nevi of right lower limb, including hip: Secondary | ICD-10-CM | POA: Diagnosis not present

## 2024-04-15 DIAGNOSIS — I25118 Atherosclerotic heart disease of native coronary artery with other forms of angina pectoris: Secondary | ICD-10-CM | POA: Diagnosis not present

## 2024-04-15 DIAGNOSIS — I493 Ventricular premature depolarization: Secondary | ICD-10-CM | POA: Diagnosis not present

## 2024-04-15 DIAGNOSIS — I480 Paroxysmal atrial fibrillation: Secondary | ICD-10-CM | POA: Diagnosis not present

## 2024-04-15 DIAGNOSIS — I5022 Chronic systolic (congestive) heart failure: Secondary | ICD-10-CM | POA: Diagnosis not present

## 2024-04-15 DIAGNOSIS — I447 Left bundle-branch block, unspecified: Secondary | ICD-10-CM | POA: Diagnosis not present

## 2024-04-15 DIAGNOSIS — I10 Essential (primary) hypertension: Secondary | ICD-10-CM | POA: Diagnosis not present

## 2024-04-15 DIAGNOSIS — E7849 Other hyperlipidemia: Secondary | ICD-10-CM | POA: Diagnosis not present

## 2024-04-29 DIAGNOSIS — H353132 Nonexudative age-related macular degeneration, bilateral, intermediate dry stage: Secondary | ICD-10-CM | POA: Diagnosis not present

## 2024-04-29 DIAGNOSIS — Z961 Presence of intraocular lens: Secondary | ICD-10-CM | POA: Diagnosis not present

## 2024-04-29 DIAGNOSIS — H35372 Puckering of macula, left eye: Secondary | ICD-10-CM | POA: Diagnosis not present

## 2024-05-06 ENCOUNTER — Telehealth: Payer: Self-pay | Admitting: *Deleted

## 2024-05-06 NOTE — Telephone Encounter (Signed)
 Patient says 2 canceled the appointment for labs on 8/11 he is already going to his PCP on the same day at 8:00 to get labs.  Dr. Valentine Gasmen only wants a CBC.  I am calling the PCP to see what they are getting. I called [facility] and he has cbc and other labs to.

## 2024-05-07 ENCOUNTER — Inpatient Hospital Stay: Attending: Internal Medicine

## 2024-05-07 DIAGNOSIS — D693 Immune thrombocytopenic purpura: Secondary | ICD-10-CM | POA: Diagnosis not present

## 2024-05-07 DIAGNOSIS — I10 Essential (primary) hypertension: Secondary | ICD-10-CM | POA: Diagnosis not present

## 2024-05-07 DIAGNOSIS — D649 Anemia, unspecified: Secondary | ICD-10-CM | POA: Diagnosis not present

## 2024-05-07 DIAGNOSIS — R7309 Other abnormal glucose: Secondary | ICD-10-CM | POA: Diagnosis not present

## 2024-05-07 DIAGNOSIS — E7849 Other hyperlipidemia: Secondary | ICD-10-CM | POA: Diagnosis not present

## 2024-05-09 ENCOUNTER — Encounter: Payer: Self-pay | Admitting: Podiatry

## 2024-05-09 ENCOUNTER — Ambulatory Visit: Admitting: Podiatry

## 2024-05-09 VITALS — Ht 70.0 in | Wt 181.6 lb

## 2024-05-09 DIAGNOSIS — M79675 Pain in left toe(s): Secondary | ICD-10-CM

## 2024-05-09 DIAGNOSIS — M79674 Pain in right toe(s): Secondary | ICD-10-CM | POA: Diagnosis not present

## 2024-05-09 DIAGNOSIS — B351 Tinea unguium: Secondary | ICD-10-CM | POA: Diagnosis not present

## 2024-05-09 NOTE — Progress Notes (Signed)
   Chief Complaint  Patient presents with   Nail Problem    RFC    SUBJECTIVE Patient presents to office today complaining of elongated, thickened nails that cause pain while ambulating in shoes.  Patient is unable to trim their own nails. Patient is here for further evaluation and treatment.  Past Medical History:  Diagnosis Date   CAD (coronary artery disease)    Cancer (HCC)    melanoma   CHF (congestive heart failure) (HCC)systolic    2017 with pneumonia   Colon polyps    GERD (gastroesophageal reflux disease)    Gout    Hyperlipidemia    Hypertension    IDA (iron deficiency anemia)    Kidney stones    MI (myocardial infarction) (HCC) 2004   Mild anemia    Paroxysmal atrial fibrillation (HCC)    PONV (postoperative nausea and vomiting)    Thrombocytopenia (HCC)    followed by oncology and PCP   Wears hearing aid in both ears     Allergies  Allergen Reactions   Penicillins Swelling    Has patient had a PCN reaction causing immediate rash, facial/tongue/throat swelling, SOB or lightheadedness with hypotension: Yes Has patient had a PCN reaction causing severe rash involving mucus membranes or skin necrosis: No Has patient had a PCN reaction that required hospitalization No Has patient had a PCN reaction occurring within the last 10 years: No If all of the above answers are NO, then may proceed with Cephalosporin use.      OBJECTIVE General Patient is awake, alert, and oriented x 3 and in no acute distress. Derm Skin is dry and supple bilateral. Negative open lesions or macerations. Remaining integument unremarkable. Nails are tender, long, thickened and dystrophic with subungual debris, consistent with onychomycosis, 1-5 bilateral. No signs of infection noted. Vasc  DP and PT pedal pulses palpable bilaterally. Temperature gradient within normal limits.  Neuro Epicritic and protective threshold sensation grossly intact bilaterally.  Musculoskeletal Exam No  symptomatic pedal deformities noted bilateral. Muscular strength within normal limits.  ASSESSMENT 1.  Pain due to onychomycosis of toenails both  PLAN OF CARE 1. Patient evaluated today.  2. Instructed to maintain good pedal hygiene and foot care.  3. Mechanical debridement of nails 1-5 bilaterally performed using a nail nipper. Filed with dremel without incident.  4. Return to clinic in 3 mos.    Dot Gazella, DPM [facility]  Dr. Dot Gazella, DPM    [address] [place], [place] [address]                Office [phone]  Fax [phone]

## 2024-05-14 DIAGNOSIS — Z85820 Personal history of malignant melanoma of skin: Secondary | ICD-10-CM | POA: Diagnosis not present

## 2024-05-14 DIAGNOSIS — I10 Essential (primary) hypertension: Secondary | ICD-10-CM | POA: Diagnosis not present

## 2024-05-14 DIAGNOSIS — M1000 Idiopathic gout, unspecified site: Secondary | ICD-10-CM | POA: Diagnosis not present

## 2024-05-14 DIAGNOSIS — M25551 Pain in right hip: Secondary | ICD-10-CM | POA: Diagnosis not present

## 2024-05-14 DIAGNOSIS — J849 Interstitial pulmonary disease, unspecified: Secondary | ICD-10-CM | POA: Diagnosis not present

## 2024-05-14 DIAGNOSIS — D692 Other nonthrombocytopenic purpura: Secondary | ICD-10-CM | POA: Diagnosis not present

## 2024-05-14 DIAGNOSIS — E7849 Other hyperlipidemia: Secondary | ICD-10-CM | POA: Diagnosis not present

## 2024-05-14 DIAGNOSIS — D693 Immune thrombocytopenic purpura: Secondary | ICD-10-CM | POA: Diagnosis not present

## 2024-05-14 DIAGNOSIS — R7303 Prediabetes: Secondary | ICD-10-CM | POA: Diagnosis not present

## 2024-05-14 DIAGNOSIS — J40 Bronchitis, not specified as acute or chronic: Secondary | ICD-10-CM | POA: Diagnosis not present

## 2024-05-14 DIAGNOSIS — I5022 Chronic systolic (congestive) heart failure: Secondary | ICD-10-CM | POA: Diagnosis not present

## 2024-05-14 DIAGNOSIS — M25552 Pain in left hip: Secondary | ICD-10-CM | POA: Diagnosis not present

## 2024-05-19 ENCOUNTER — Other Ambulatory Visit: Payer: Self-pay | Admitting: Internal Medicine

## 2024-05-19 DIAGNOSIS — J849 Interstitial pulmonary disease, unspecified: Secondary | ICD-10-CM

## 2024-05-19 DIAGNOSIS — I10 Essential (primary) hypertension: Secondary | ICD-10-CM

## 2024-05-20 DIAGNOSIS — M25552 Pain in left hip: Secondary | ICD-10-CM | POA: Diagnosis not present

## 2024-05-20 DIAGNOSIS — M25551 Pain in right hip: Secondary | ICD-10-CM | POA: Diagnosis not present

## 2024-05-20 DIAGNOSIS — M160 Bilateral primary osteoarthritis of hip: Secondary | ICD-10-CM | POA: Diagnosis not present

## 2024-05-20 DIAGNOSIS — M5416 Radiculopathy, lumbar region: Secondary | ICD-10-CM | POA: Diagnosis not present

## 2024-05-28 ENCOUNTER — Ambulatory Visit

## 2024-06-04 DIAGNOSIS — M5416 Radiculopathy, lumbar region: Secondary | ICD-10-CM | POA: Diagnosis not present

## 2024-06-04 DIAGNOSIS — M25552 Pain in left hip: Secondary | ICD-10-CM | POA: Diagnosis not present

## 2024-06-04 DIAGNOSIS — M25562 Pain in left knee: Secondary | ICD-10-CM | POA: Diagnosis not present

## 2024-06-04 DIAGNOSIS — M25551 Pain in right hip: Secondary | ICD-10-CM | POA: Diagnosis not present

## 2024-06-09 DIAGNOSIS — M545 Low back pain, unspecified: Secondary | ICD-10-CM | POA: Diagnosis not present

## 2024-06-16 DIAGNOSIS — G5603 Carpal tunnel syndrome, bilateral upper limbs: Secondary | ICD-10-CM | POA: Diagnosis not present

## 2024-06-20 DIAGNOSIS — M545 Low back pain, unspecified: Secondary | ICD-10-CM | POA: Diagnosis not present

## 2024-06-20 DIAGNOSIS — M25551 Pain in right hip: Secondary | ICD-10-CM | POA: Diagnosis not present

## 2024-06-20 DIAGNOSIS — M5416 Radiculopathy, lumbar region: Secondary | ICD-10-CM | POA: Diagnosis not present

## 2024-06-20 DIAGNOSIS — M25552 Pain in left hip: Secondary | ICD-10-CM | POA: Diagnosis not present

## 2024-06-20 DIAGNOSIS — M25562 Pain in left knee: Secondary | ICD-10-CM | POA: Diagnosis not present

## 2024-06-30 DIAGNOSIS — G5603 Carpal tunnel syndrome, bilateral upper limbs: Secondary | ICD-10-CM | POA: Diagnosis not present

## 2024-06-30 DIAGNOSIS — M180 Bilateral primary osteoarthritis of first carpometacarpal joints: Secondary | ICD-10-CM | POA: Diagnosis not present

## 2024-08-07 ENCOUNTER — Encounter: Payer: Self-pay | Admitting: Internal Medicine

## 2024-08-07 ENCOUNTER — Inpatient Hospital Stay: Attending: Internal Medicine

## 2024-08-07 ENCOUNTER — Inpatient Hospital Stay: Admitting: Internal Medicine

## 2024-08-07 VITALS — BP 119/52 | HR 82 | Temp 98.2°F | Resp 20 | Ht 70.0 in | Wt 177.6 lb

## 2024-08-07 DIAGNOSIS — Z87891 Personal history of nicotine dependence: Secondary | ICD-10-CM | POA: Insufficient documentation

## 2024-08-07 DIAGNOSIS — Z803 Family history of malignant neoplasm of breast: Secondary | ICD-10-CM | POA: Insufficient documentation

## 2024-08-07 DIAGNOSIS — Z7982 Long term (current) use of aspirin: Secondary | ICD-10-CM | POA: Diagnosis not present

## 2024-08-07 DIAGNOSIS — D693 Immune thrombocytopenic purpura: Secondary | ICD-10-CM

## 2024-08-07 DIAGNOSIS — I2510 Atherosclerotic heart disease of native coronary artery without angina pectoris: Secondary | ICD-10-CM | POA: Diagnosis not present

## 2024-08-07 LAB — CBC WITH DIFFERENTIAL (CANCER CENTER ONLY)
Abs Immature Granulocytes: 0.12 K/uL — ABNORMAL HIGH (ref 0.00–0.07)
Basophils Absolute: 0.1 K/uL (ref 0.0–0.1)
Basophils Relative: 1 %
Eosinophils Absolute: 0.4 K/uL (ref 0.0–0.5)
Eosinophils Relative: 3 %
HCT: 42.2 % (ref 39.0–52.0)
Hemoglobin: 13 g/dL (ref 13.0–17.0)
Immature Granulocytes: 1 %
Lymphocytes Relative: 6 %
Lymphs Abs: 0.9 K/uL (ref 0.7–4.0)
MCH: 27 pg (ref 26.0–34.0)
MCHC: 30.8 g/dL (ref 30.0–36.0)
MCV: 87.6 fL (ref 80.0–100.0)
Monocytes Absolute: 1.2 K/uL — ABNORMAL HIGH (ref 0.1–1.0)
Monocytes Relative: 8 %
Neutro Abs: 12.3 K/uL — ABNORMAL HIGH (ref 1.7–7.7)
Neutrophils Relative %: 81 %
Platelet Count: 70 K/uL — ABNORMAL LOW (ref 150–400)
RBC: 4.82 MIL/uL (ref 4.22–5.81)
RDW: 17.1 % — ABNORMAL HIGH (ref 11.5–15.5)
WBC Count: 14.9 K/uL — ABNORMAL HIGH (ref 4.0–10.5)
nRBC: 0 % (ref 0.0–0.2)

## 2024-08-07 LAB — CMP (CANCER CENTER ONLY)
ALT: 10 U/L (ref 0–44)
AST: 12 U/L — ABNORMAL LOW (ref 15–41)
Albumin: 3.6 g/dL (ref 3.5–5.0)
Alkaline Phosphatase: 56 U/L (ref 38–126)
Anion gap: 9 (ref 5–15)
BUN: 20 mg/dL (ref 8–23)
CO2: 23 mmol/L (ref 22–32)
Calcium: 8.8 mg/dL — ABNORMAL LOW (ref 8.9–10.3)
Chloride: 108 mmol/L (ref 98–111)
Creatinine: 1.04 mg/dL (ref 0.61–1.24)
GFR, Estimated: >60 mL/min (ref >60)
Glucose, Bld: 119 mg/dL — ABNORMAL HIGH (ref 70–99)
Potassium: 4.3 mmol/L (ref 3.5–5.1)
Sodium: 140 mmol/L (ref 135–145)
Total Bilirubin: 0.4 mg/dL (ref 0.0–1.2)
Total Protein: 6.2 g/dL — ABNORMAL LOW (ref 6.5–8.1)

## 2024-08-07 NOTE — Progress Notes (Signed)
 No concerns today

## 2024-08-07 NOTE — Progress Notes (Signed)
 [facility] OFFICE PROGRESS NOTE  Patient Care Team: Levi Ophelia JINNY DOUGLAS, MD as PCP - General (Internal Medicine) Levi Cindy SAUNDERS, MD as Consulting Physician (Oncology)   SUMMARY OF ONCOLOGIC HISTORY:  2012- CHRONIC ITP-[ BMBx; 2012- hypercellular 60%; megakaryocytes/no dyspoiesis; FISH- Neg; Levi Figueroa]; US - Abdo-Neg for spleen/liver; HIV/hepatitis-NEG; OCT 2017- Rituxan  weekly x4  # July 2017- Melanoma s/p excision [? Stage; Levi Figueroa]; status post lithotripsy [Levi Figueroa; 2019]; 2019-CT scan incidental liver lesion; likely hemangioma [Levi Figueroa CT scan]; 2021- COVID  INTERVAL HISTORY: Ambulating independently.  Accompanied by his wife.  89 year old male patient with a history of ITP-currently on asprin surveillance is here for follow-up.  Denies any bleeding or bruising. Bowels normal. Otherwise denies any new symptoms. No blood in stools or black-colored stools.  Review of Systems  Constitutional: Negative.  Negative for chills, diaphoresis, fever, malaise/fatigue and weight loss.  HENT: Negative.  Negative for nosebleeds and sore throat.   Eyes: Negative.  Negative for double vision.  Respiratory: Negative.  Negative for cough, hemoptysis, sputum production, shortness of breath and wheezing.   Cardiovascular: Negative.  Negative for chest pain, palpitations, orthopnea and leg swelling.  Gastrointestinal: Negative.  Negative for abdominal pain, blood in stool, constipation, diarrhea, heartburn, melena, nausea and vomiting.  Genitourinary: Negative.  Negative for dysuria, frequency and urgency.  Musculoskeletal: Negative.  Negative for back pain and joint pain.  Skin: Negative.  Negative for itching and rash.  Neurological: Negative.  Negative for dizziness, tingling, focal weakness, weakness and headaches.  Endo/Heme/Allergies:  Bruises/bleeds easily.  Psychiatric/Behavioral: Negative.  Negative for depression. The patient is not nervous/anxious and does not have  insomnia.      PAST MEDICAL HISTORY :  Past Medical History:  Diagnosis Date   CAD (coronary artery disease)    Cancer (HCC)    melanoma   CHF (congestive heart failure) (HCC)systolic    2017 with pneumonia   Colon polyps    GERD (gastroesophageal reflux disease)    Gout    Hyperlipidemia    Hypertension    IDA (iron deficiency anemia)    Kidney stones    MI (myocardial infarction) (HCC) 2004   Mild anemia    Paroxysmal atrial fibrillation (HCC)    PONV (postoperative nausea and vomiting)    Thrombocytopenia (HCC)    followed by oncology and PCP   Wears hearing aid in both ears     PAST SURGICAL HISTORY :   Past Surgical History:  Procedure Laterality Date   BLADDER SURGERY     CARDIAC CATHETERIZATION     CATARACT EXTRACTION W/PHACO Right 02/22/2022   Procedure: CATARACT EXTRACTION PHACO AND INTRAOCULAR LENS PLACEMENT (IOC) RIGHT 11.87 01:17.3;  Surgeon: Levi Gaskin, MD;  Location: [facility];  Service: Ophthalmology;  Laterality: Right;   CATARACT EXTRACTION W/PHACO Left 03/08/2022   Procedure: CATARACT EXTRACTION PHACO AND INTRAOCULAR LENS PLACEMENT (IOC)LEFT MiLOOP 4.40 01:01.5;  Surgeon: Levi Gaskin, MD;  Location: [facility];  Service: Ophthalmology;  Laterality: Left;   CORONARY ARTERY BYPASS GRAFT  2004   5 vessel   CYSTOSCOPY WITH LITHOLAPAXY N/A 07/05/2018   Procedure: CYSTOSCOPY WITH LITHOLAPAXY;  Surgeon: Figueroa Glendia BROCKS, MD;  Location: [facility];  Service: Urology;  Laterality: N/A;   HOLMIUM LASER APPLICATION N/A 07/05/2018   Procedure: HOLMIUM LASER APPLICATION;  Surgeon: Figueroa Glendia BROCKS, MD;  Location: [facility];  Service: Urology;  Laterality: N/A;   INGUINAL HERNIA REPAIR Right    LASER ABLATION     x 2  prostatic hypertrophy   TONSILLECTOMY      FAMILY HISTORY :   Family History  Problem Relation Age of Onset   Breast cancer Mother    Diabetes Father    Heart disease Father    Heart disease Brother    Heart  disease Brother    Prostate cancer Neg Hx    Bladder Cancer Neg Hx    Kidney cancer Neg Hx     SOCIAL HISTORY:   Social History   Tobacco Use   Smoking status: Former    Current packs/day: 0.00    Types: Cigarettes    Quit date: 1963    Years since quitting: 62.7   Smokeless tobacco: Never  Vaping Use   Vaping status: Never Used  Substance Use Topics   Alcohol  use: No    Alcohol /week: 0.0 standard drinks of alcohol    Drug use: No    ALLERGIES:  is allergic to penicillins.  MEDICATIONS:  Current Outpatient Medications  Medication Sig Dispense Refill   acetaminophen  (TYLENOL ) 500 MG tablet Take 500 mg by mouth every 6 (six) hours as needed.     albuterol  (VENTOLIN  HFA) 108 (90 Base) MCG/ACT inhaler Inhale 1-2 puffs into the lungs every 4 (four) hours as needed for wheezing or shortness of breath. 18 g 0   aspirin  EC 81 MG tablet Take 1 tablet (81 mg total) by mouth daily. Swallow whole. 30 tablet 12   atorvastatin  (LIPITOR) 40 MG tablet Take 1 tablet (40 mg total) by mouth daily at 2 PM. 30 tablet 0   cetirizine (ZYRTEC) 10 MG tablet Take 10 mg by mouth daily.     Cholecalciferol (VITAMIN D -3 PO) Take by mouth.     furosemide  (LASIX ) 20 MG tablet Take to 1 tablet (20 mg total) by mouth ONCE OR TWICE daily (total daily dose maximum 40 mg) as needed for up to 3 days for increased leg swelling, shortness of breath, weight gain 5+ lbs over 1-2 days. Seek medical care if these symptoms are not improving with increased dose. 30 tablet 11   ipratropium (ATROVENT) 0.06 % nasal spray Place 2 sprays into both nostrils every 8 (eight) hours.     isosorbide  mononitrate (IMDUR ) 30 MG 24 hr tablet Take 1 tablet (30 mg total) by mouth daily. 30 tablet 0   JARDIANCE 10 MG TABS tablet Take 10 mg by mouth daily.     lisinopril  (ZESTRIL ) 10 MG tablet Take 10 mg by mouth daily.     magnesium  oxide (MAG-OX) 400 MG tablet magnesium  oxide 400 mg (241.3 mg magnesium ) tablet  TAKE 1 TABLET BY MOUTH  ONCE DAILY     metoprolol  tartrate 75 MG TABS Take 75 mg by mouth 2 (two) times daily. (Patient taking differently: Take 25 mg by mouth 2 (two) times daily.) 60 tablet 0   Multiple Vitamins-Minerals (PRESERVISION AREDS 2 PO) Take 1 tablet by mouth 2 (two) times daily.     nitroGLYCERIN  (NITROSTAT ) 0.4 MG SL tablet Place 1 tablet (0.4 mg total) under the tongue every 5 (five) minutes as needed for chest pain. 30 tablet 0   Omega-3 Fatty Acids (FISH OIL) 1200 MG CAPS Take 1,200 mg by mouth daily.     omeprazole (PRILOSEC) 20 MG capsule Take 20 mg by mouth daily.     psyllium (METAMUCIL) 58.6 % powder Take 1 packet by mouth at bedtime. (Patient taking differently: Take 1 packet by mouth daily as needed.)     triamcinolone  (KENALOG ) 0.1 % triamcinolone   acetonide 0.1 % topical cream  APPLY CREAM EXTERNALLY TWICE DAILY TO AFFECTED AREA AS NEEDED     vitamin B-12 (CYANOCOBALAMIN ) 1000 MCG tablet Take 1,000 mcg by mouth daily.     No current facility-administered medications for this visit.    PHYSICAL EXAMINATION:   BP (!) 119/52 (BP Location: Left Arm, Patient Position: Sitting, Cuff Size: Normal)   Pulse 82   Temp 98.2 F (36.8 C) (Oral)   Resp 20   Ht 5' 10 (1.778 m)   Wt 177 lb 9.6 oz (80.6 kg)   SpO2 98%   BMI 25.48 kg/m   Filed Weights   08/07/24 0956  Weight: 177 lb 9.6 oz (80.6 kg)    Physical Exam Constitutional:      Comments: He is alone.  HENT:     Head: Normocephalic and atraumatic.     Mouth/Throat:     Pharynx: No oropharyngeal exudate.  Eyes:     Pupils: Pupils are equal, round, and reactive to light.  Cardiovascular:     Rate and Rhythm: Normal rate and regular rhythm.  Pulmonary:     Effort: No respiratory distress.     Breath sounds: No wheezing.  Abdominal:     General: Bowel sounds are normal. There is no distension.     Palpations: Abdomen is soft. There is no mass.     Tenderness: There is no abdominal tenderness. There is no guarding or rebound.   Musculoskeletal:        General: No tenderness. Normal range of motion.     Cervical back: Normal range of motion and neck supple.  Skin:    General: Skin is warm.  Neurological:     Mental Status: He is alert and oriented to person, place, and time.  Psychiatric:        Mood and Affect: Affect normal.     LABORATORY DATA:  I have reviewed the data as listed    Component Value Date/Time   NA 140 08/07/2024 1019   NA 140 12/17/2012 1229   K 4.3 08/07/2024 1019   K 4.4 12/17/2012 1229   CL 108 08/07/2024 1019   CL 106 12/17/2012 1229   CO2 23 08/07/2024 1019   CO2 26 12/17/2012 1229   GLUCOSE 119 (H) 08/07/2024 1019   GLUCOSE 77 12/17/2012 1229   BUN 20 08/07/2024 1019   BUN 19 (H) 12/17/2012 1229   CREATININE 1.04 08/07/2024 1019   CREATININE 1.05 12/17/2012 1229   CALCIUM  8.8 (L) 08/07/2024 1019   CALCIUM  8.4 (L) 12/17/2012 1229   PROT 6.2 (L) 08/07/2024 1019   ALBUMIN 3.6 08/07/2024 1019   AST 12 (L) 08/07/2024 1019   ALT 10 08/07/2024 1019   ALKPHOS 56 08/07/2024 1019   BILITOT 0.4 08/07/2024 1019   GFRNONAA >60 08/07/2024 1019   GFRNONAA >60 12/17/2012 1229   GFRAA >60 07/12/2020 0952   GFRAA >60 12/17/2012 1229    No results found for: SPEP, UPEP  Lab Results  Component Value Date   WBC 14.9 (H) 08/07/2024   NEUTROABS 12.3 (H) 08/07/2024   HGB 13.0 08/07/2024   HCT 42.2 08/07/2024   MCV 87.6 08/07/2024   PLT 70 (L) 08/07/2024      Chemistry      Component Value Date/Time   NA 140 08/07/2024 1019   NA 140 12/17/2012 1229   K 4.3 08/07/2024 1019   K 4.4 12/17/2012 1229   CL 108 08/07/2024 1019   CL 106 12/17/2012 1229  CO2 23 08/07/2024 1019   CO2 26 12/17/2012 1229   BUN 20 08/07/2024 1019   BUN 19 (H) 12/17/2012 1229   CREATININE 1.04 08/07/2024 1019   CREATININE 1.05 12/17/2012 1229      Component Value Date/Time   CALCIUM  8.8 (L) 08/07/2024 1019   CALCIUM  8.4 (L) 12/17/2012 1229   ALKPHOS 56 08/07/2024 1019   AST 12 (L)  08/07/2024 1019   ALT 10 08/07/2024 1019   BILITOT 0.4 08/07/2024 1019       RADIOGRAPHIC STUDIES: I have personally reviewed the radiological images as listed and agreed with the findings in the report. No results found.   ASSESSMENT & PLAN:   Idiopathic thrombocytopenic purpura (HCC) #Chronic ITP-platelets stable between 60-70,000s/p rituxan  in 2017.  Asymptomatic.   # Platelets today are 70- continue surveillance. Over all stable.   # As pt is asymptomatic- [poor response to steroids] monitor for Would recommend treatment with Rituxan  /thrombopoietin stimulating agent if platelets < 50 or patient is bleeding. Recommend HOLDING Asprin 7 days prior to dental cleaning or any procedure.   # CAD - Asprin 81mg /day [LeviFath]  # DISPOSITION:   # follow up in 6 months with MD/labs- cbc/cmp; LeviB      Cindy JONELLE Joe, MD 08/07/2024 11:11 AM

## 2024-08-07 NOTE — Assessment & Plan Note (Addendum)
#  Chronic ITP-platelets stable between 60-70,000s/p rituxan  in 2017.  Asymptomatic.   # Platelets today are 70- continue surveillance. Over all stable.   # As pt is asymptomatic- [poor response to steroids] monitor for Would recommend treatment with Rituxan  /thrombopoietin stimulating agent if platelets < 50 or patient is bleeding. Recommend HOLDING Asprin 7 days prior to dental cleaning or any procedure.   # CAD - Asprin 81mg /day [Dr.Fath]  # DISPOSITION:   # follow up in 6 months with MD/labs- cbc/cmp; Dr.B

## 2024-08-08 ENCOUNTER — Ambulatory Visit: Admitting: Podiatry

## 2024-08-08 ENCOUNTER — Encounter: Payer: Self-pay | Admitting: Podiatry

## 2024-08-08 DIAGNOSIS — B351 Tinea unguium: Secondary | ICD-10-CM

## 2024-08-08 DIAGNOSIS — M79675 Pain in left toe(s): Secondary | ICD-10-CM

## 2024-08-08 DIAGNOSIS — M79674 Pain in right toe(s): Secondary | ICD-10-CM | POA: Diagnosis not present

## 2024-08-11 ENCOUNTER — Encounter: Payer: Self-pay | Admitting: Podiatry

## 2024-08-11 NOTE — Progress Notes (Signed)
  Subjective:  Patient ID: Levi Figueroa, male    DOB: Sep 08, 1935,  MRN: [id number]  Levi Figueroa presents to clinic today for painful elongated mycotic toenails 1-5 bilaterally which are tender when wearing enclosed shoe gear. Pain is relieved with periodic professional debridement.  Chief Complaint  Patient presents with   RFC    Rm1 Routine foot care/ Dr. Ophelia Sage last visit June 2024   New problem(s): None.   PCP is Sage Ophelia JINNY DOUGLAS, MD.  Allergies  Allergen Reactions   Penicillins Swelling    Has patient had a PCN reaction causing immediate rash, facial/tongue/throat swelling, SOB or lightheadedness with hypotension: Yes Has patient had a PCN reaction causing severe rash involving mucus membranes or skin necrosis: No Has patient had a PCN reaction that required hospitalization No Has patient had a PCN reaction occurring within the last 10 years: No If all of the above answers are NO, then may proceed with Cephalosporin use.     Review of Systems: Negative except as noted in the HPI.  Objective: No changes noted in today's physical examination. There were no vitals filed for this visit. Levi Figueroa is a pleasant 89 y.o. male WD, WN in NAD. AAO x 3.  Vascular Examination: Capillary refill time immediate b/l. Palpable pedal pulses. Pedal hair present b/l. Pedal edema absent. No pain with calf compression b/l. Skin temperature gradient WNL b/l. No cyanosis or clubbing b/l. No ischemia or gangrene noted b/l.   Neurological Examination: Sensation grossly intact b/l with 10 gram monofilament. Vibratory sensation intact b/l.   Dermatological Examination: Pedal skin with normal turgor, texture and tone b/l.  No open wounds. No interdigital macerations.   Toenails 1-5 b/l thick, discolored, elongated with subungual debris and pain on dorsal palpation.   No hyperkeratotic nor porokeratotic lesions.  Musculoskeletal Examination: Muscle strength 5/5 to all lower  extremity muscle groups bilaterally. No pain, crepitus or joint limitation noted with ROM bilateral LE.  Radiographs: None  Assessment/Plan: 1. Pain due to onychomycosis of toenails of both feet     Patient was evaluated and treated. All patient's and/or POA's questions/concerns addressed on today's visit. Toenails 1-5 debrided in length and girth without incident. Continue soft, supportive shoe gear daily. Report any pedal injuries to medical professional. Call office if there are any questions/concerns. -Patient/POA to call should there be question/concern in the interim.   Return in about 3 months (around 11/07/2024).  Levi Figueroa, DPM      [facility] LOCATION: [address], [place] [address]                   Office [phone]   [facility] LOCATION: [address] [place], [place] [address] Office [phone]

## 2024-08-20 DIAGNOSIS — I493 Ventricular premature depolarization: Secondary | ICD-10-CM | POA: Diagnosis not present

## 2024-08-20 DIAGNOSIS — E7849 Other hyperlipidemia: Secondary | ICD-10-CM | POA: Diagnosis not present

## 2024-08-20 DIAGNOSIS — Z23 Encounter for immunization: Secondary | ICD-10-CM | POA: Diagnosis not present

## 2024-08-20 DIAGNOSIS — I10 Essential (primary) hypertension: Secondary | ICD-10-CM | POA: Diagnosis not present

## 2024-08-20 DIAGNOSIS — I5022 Chronic systolic (congestive) heart failure: Secondary | ICD-10-CM | POA: Diagnosis not present

## 2024-08-20 DIAGNOSIS — I25118 Atherosclerotic heart disease of native coronary artery with other forms of angina pectoris: Secondary | ICD-10-CM | POA: Diagnosis not present

## 2024-08-20 DIAGNOSIS — I447 Left bundle-branch block, unspecified: Secondary | ICD-10-CM | POA: Diagnosis not present

## 2024-08-20 DIAGNOSIS — I480 Paroxysmal atrial fibrillation: Secondary | ICD-10-CM | POA: Diagnosis not present

## 2024-10-17 DIAGNOSIS — J209 Acute bronchitis, unspecified: Secondary | ICD-10-CM | POA: Diagnosis not present

## 2024-11-07 ENCOUNTER — Ambulatory Visit: Admitting: Podiatry

## 2024-11-07 ENCOUNTER — Encounter: Payer: Self-pay | Admitting: Podiatry

## 2024-11-07 DIAGNOSIS — D689 Coagulation defect, unspecified: Secondary | ICD-10-CM | POA: Diagnosis not present

## 2024-11-07 DIAGNOSIS — B351 Tinea unguium: Secondary | ICD-10-CM | POA: Diagnosis not present

## 2024-11-07 DIAGNOSIS — M79674 Pain in right toe(s): Secondary | ICD-10-CM | POA: Diagnosis not present

## 2024-11-07 DIAGNOSIS — M79675 Pain in left toe(s): Secondary | ICD-10-CM

## 2024-11-07 DIAGNOSIS — L84 Corns and callosities: Secondary | ICD-10-CM | POA: Diagnosis not present

## 2024-11-15 NOTE — Progress Notes (Signed)
"  °  Subjective:  Patient ID: Levi Figueroa, male    DOB: Apr 11, 1935,  MRN: [id number]  Levi Figueroa presents to clinic today for at risk foot care with h/o clotting disorder and callus(es) of both feet and painful mycotic toenails that are difficult to trim. Painful toenails interfere with ambulation. Aggravating factors include wearing enclosed shoe gear. Pain is relieved with periodic professional debridement. Painful calluses are aggravated when weightbearing with and without shoegear. Pain is relieved with periodic professional debridement.  Chief Complaint  Patient presents with   Nail Problem    He sees Dr. Fernande on 11/25/24. Denies being diabetic   New problem(s): None.   PCP is Fernande Ophelia JINNY DOUGLAS, MD.  Allergies[1]  Review of Systems: Negative except as noted in the HPI.  Objective:  There were no vitals filed for this visit. Levi Figueroa is a pleasant 89 y.o. male WD, WN in NAD. AAO x 3.  Vascular Examination: Capillary refill time immediate b/l. Palpable pedal pulses. Pedal hair present b/l. Pedal edema absent. No pain with calf compression b/l. Skin temperature gradient WNL b/l. No cyanosis or clubbing b/l. No ischemia or gangrene noted b/l.   Neurological Examination: Sensation grossly intact b/l with 10 gram monofilament. Vibratory sensation intact b/l.   Dermatological Examination: Pedal skin with normal turgor, texture and tone b/l.  No open wounds. No interdigital macerations.   Toenails 1-5 b/l thick, discolored, elongated with subungual debris and pain on dorsal palpation.   Hyperkeratotic lesion(s) submet head 1 b/l.  No erythema, no edema, no drainage, no fluctuance.  Musculoskeletal Examination: Muscle strength 5/5 to all lower extremity muscle groups bilaterally. No pain, crepitus or joint limitation noted with ROM bilateral LE.  Radiographs: None Assessment/Plan: 1. Pain due to onychomycosis of toenails of both feet   2. Callus   3. Clotting disorder    Consent given for treatment. Patient examined. All patient's and/or POA's questions/concerns addressed on today's visit.Toenails 1-5 b/l debrided in length and girth without incident. Callus(es) submet head 1 b/l pared with sharp debridement without incident. Continue soft, supportive shoe gear daily. Report any pedal injuries to medical professional. Call office if there are any questions/concerns. -Patient/POA to call should there be question/concern in the interim.   Return in about 3 months (around 02/05/2025).  Delon LITTIE Merlin, DPM       LOCATION: [address] [place] [address]                   Office [phone]   [facility] LOCATION: [address] [place], [place] [address] Office [phone]     [1]  Allergies Allergen Reactions   Penicillins Swelling    Has patient had a PCN reaction causing immediate rash, facial/tongue/throat swelling, SOB or lightheadedness with hypotension: Yes Has patient had a PCN reaction causing severe rash involving mucus membranes or skin necrosis: No Has patient had a PCN reaction that required hospitalization No Has patient had a PCN reaction occurring within the last 10 years: No If all of the above answers are NO, then may proceed with Cephalosporin use.    "

## 2025-02-04 ENCOUNTER — Ambulatory Visit: Admitting: Internal Medicine

## 2025-02-04 ENCOUNTER — Other Ambulatory Visit

## 2025-02-06 ENCOUNTER — Ambulatory Visit: Admitting: Podiatry
